# Patient Record
Sex: Female | Born: 1955 | Race: Black or African American | Hispanic: No | Marital: Single | Smoking: Never smoker
Health system: Southern US, Community
[De-identification: ages and names within clinical notes are randomized; demographics above are authoritative.]

## PROBLEM LIST (undated history)

## (undated) DIAGNOSIS — E785 Hyperlipidemia, unspecified: Secondary | ICD-10-CM

## (undated) DIAGNOSIS — E119 Type 2 diabetes mellitus without complications: Secondary | ICD-10-CM

## (undated) DIAGNOSIS — I10 Essential (primary) hypertension: Secondary | ICD-10-CM

## (undated) HISTORY — DX: Hyperlipidemia, unspecified: E78.5

## (undated) HISTORY — DX: Essential (primary) hypertension: I10

## (undated) HISTORY — DX: Type 2 diabetes mellitus without complications: E11.9

---
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
## 1997-05-19 HISTORY — PX: BREAST SURGERY: SHX581

## 2008-04-19 HISTORY — PX: COLONOSCOPY: SHX174

## 2011-10-24 ENCOUNTER — Ambulatory Visit: Payer: Self-pay | Admitting: Internal Medicine

## 2012-12-21 IMAGING — CR RIGHT ANKLE - COMPLETE 3+ VIEW
1 series · 6 of 6 positions shown · non-contrast
Comparison: none

REASON FOR EXAM: ankle pain - fax results [phone]
COMMENTS:

PROCEDURE:     MDR - MDR ANKLE RIGHT COMPLETE  - October 24, 2011 [DATE]
RESULT:     No fracture, dislocation or other acute bony abnormality is
identified. There is moderate soft tissue swelling about the lateral
malleolus. In the lateral view, a plantar calcaneal spur is noted.

[Series 1: ap · 0.17mm/px · 6 of 6 slices shown]
[im 1/6]
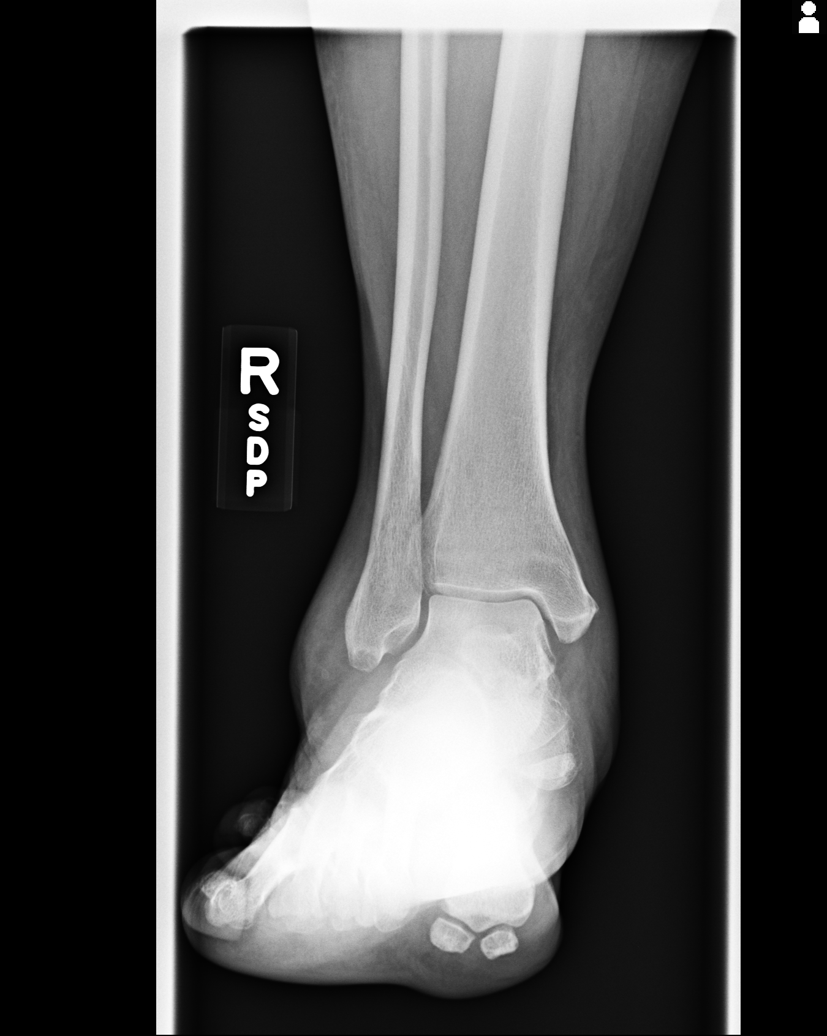
[im 2/6]
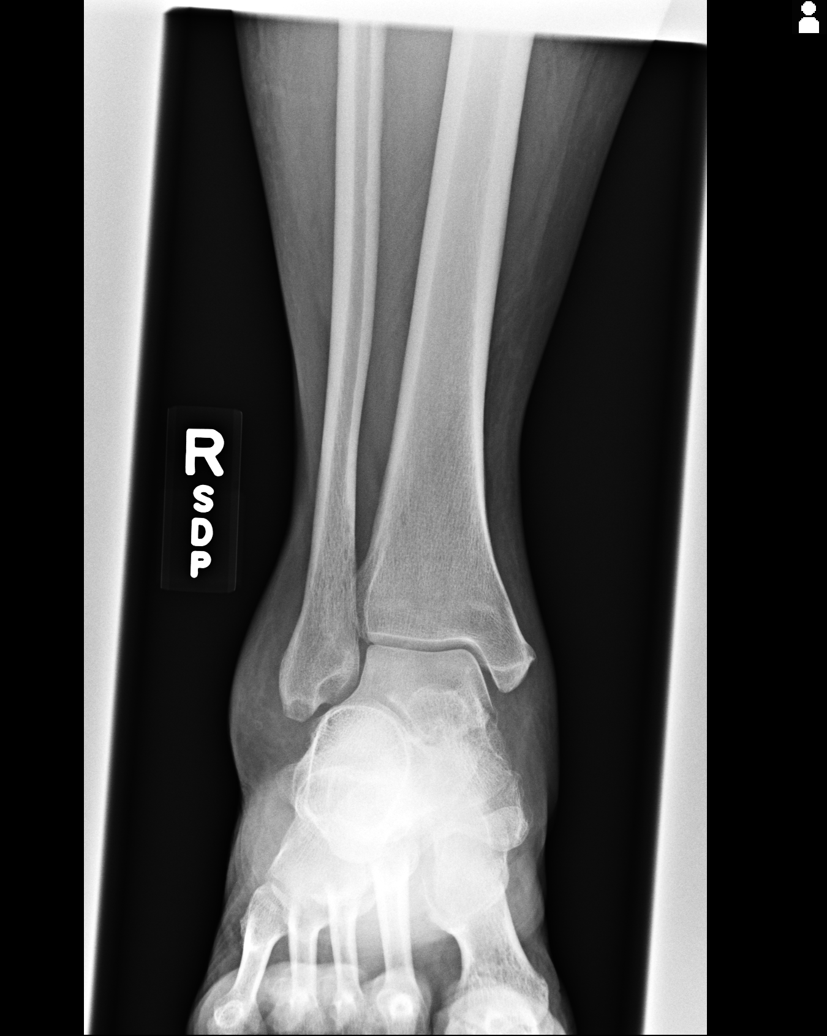
[im 3/6]
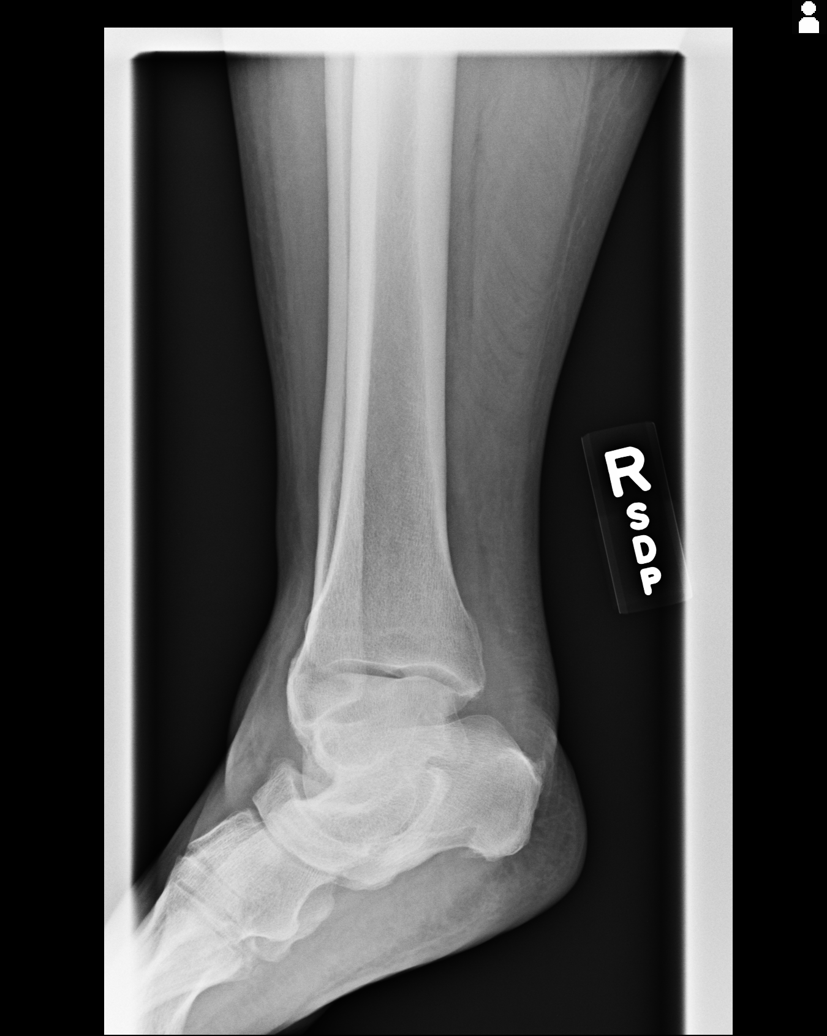
[im 4/6]
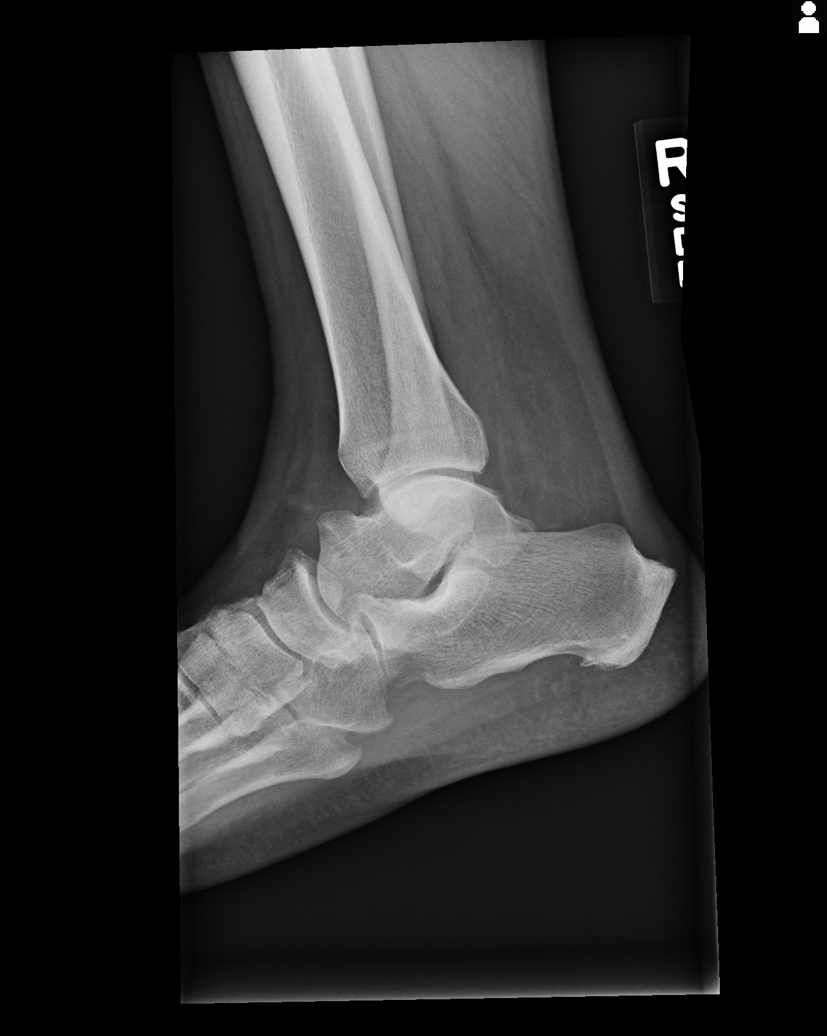
[im 5/6]
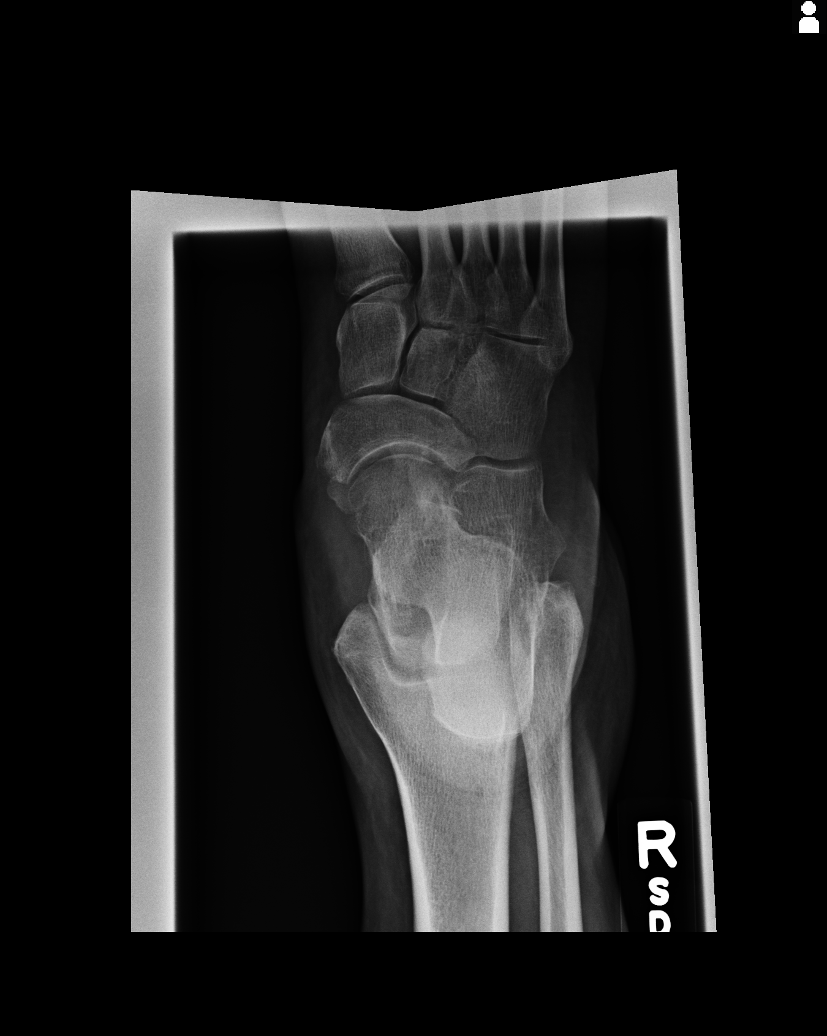
[im 6/6]
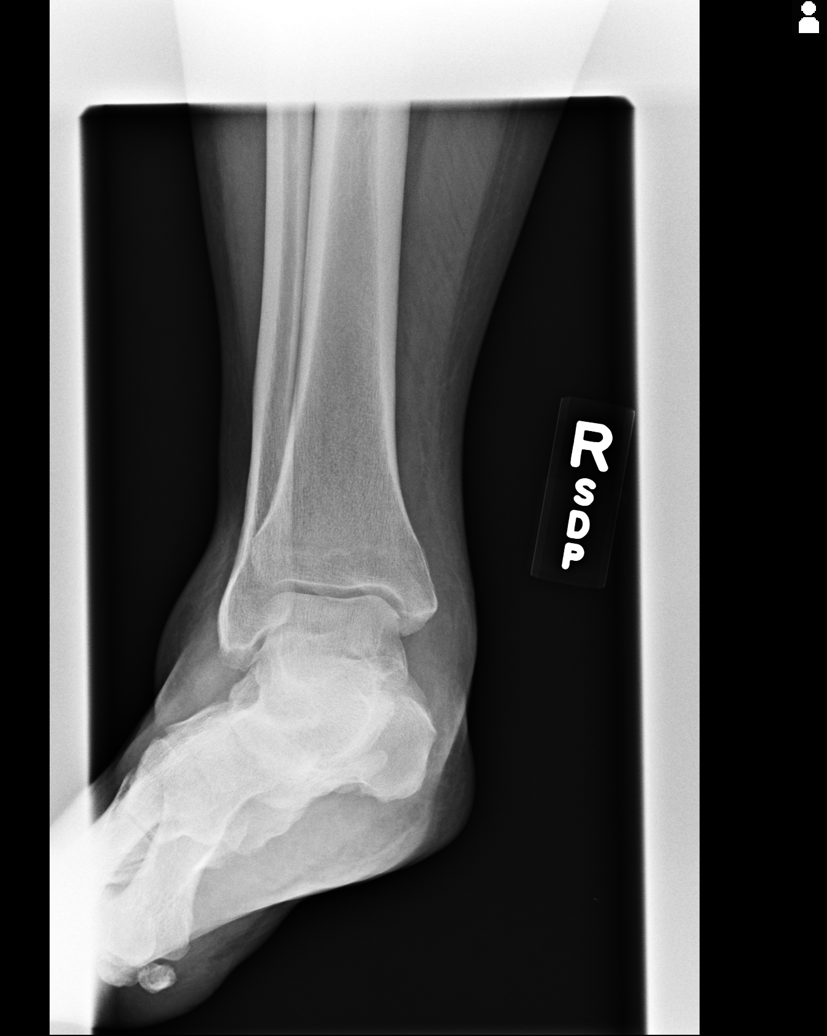

[6 of 6 positions shown; findings below may reference images not displayed]

IMPRESSION: 1. No fracture or other acute bony abnormality is seen.
2. The ankle mortise is well-maintained.
3. Soft tissue swelling is observed laterally.
4. There is a small plantar calcaneal spur.

## 2014-02-02 LAB — HM PAP SMEAR

## 2014-03-10 DIAGNOSIS — R6889 Other general symptoms and signs: Secondary | ICD-10-CM | POA: Insufficient documentation

## 2014-03-10 DIAGNOSIS — IMO0002 Reserved for concepts with insufficient information to code with codable children: Secondary | ICD-10-CM | POA: Insufficient documentation

## 2014-03-10 HISTORY — DX: Reserved for concepts with insufficient information to code with codable children: IMO0002

## 2014-12-06 LAB — HM MAMMOGRAPHY: HM MAMMO: NORMAL

## 2015-05-05 DIAGNOSIS — E785 Hyperlipidemia, unspecified: Secondary | ICD-10-CM | POA: Insufficient documentation

## 2015-05-05 DIAGNOSIS — Z9989 Dependence on other enabling machines and devices: Secondary | ICD-10-CM | POA: Insufficient documentation

## 2015-05-05 DIAGNOSIS — M76829 Posterior tibial tendinitis, unspecified leg: Secondary | ICD-10-CM | POA: Insufficient documentation

## 2015-05-05 DIAGNOSIS — M1710 Unilateral primary osteoarthritis, unspecified knee: Secondary | ICD-10-CM | POA: Insufficient documentation

## 2015-05-05 DIAGNOSIS — G43009 Migraine without aura, not intractable, without status migrainosus: Secondary | ICD-10-CM | POA: Insufficient documentation

## 2015-05-05 DIAGNOSIS — I25118 Atherosclerotic heart disease of native coronary artery with other forms of angina pectoris: Secondary | ICD-10-CM | POA: Insufficient documentation

## 2015-05-05 DIAGNOSIS — M179 Osteoarthritis of knee, unspecified: Secondary | ICD-10-CM | POA: Insufficient documentation

## 2015-05-05 DIAGNOSIS — G4733 Obstructive sleep apnea (adult) (pediatric): Secondary | ICD-10-CM | POA: Insufficient documentation

## 2015-05-05 DIAGNOSIS — N6019 Diffuse cystic mastopathy of unspecified breast: Secondary | ICD-10-CM | POA: Insufficient documentation

## 2015-05-05 DIAGNOSIS — E119 Type 2 diabetes mellitus without complications: Secondary | ICD-10-CM | POA: Insufficient documentation

## 2015-05-05 DIAGNOSIS — I10 Essential (primary) hypertension: Secondary | ICD-10-CM | POA: Insufficient documentation

## 2015-05-05 HISTORY — DX: Hyperlipidemia, unspecified: E78.5

## 2015-06-08 ENCOUNTER — Other Ambulatory Visit: Payer: Self-pay | Admitting: Internal Medicine

## 2015-06-08 ENCOUNTER — Encounter: Payer: Self-pay | Admitting: Internal Medicine

## 2015-06-08 ENCOUNTER — Ambulatory Visit (INDEPENDENT_AMBULATORY_CARE_PROVIDER_SITE_OTHER): Payer: BC Managed Care – PPO | Admitting: Internal Medicine

## 2015-06-08 VITALS — BP 124/70 | HR 56 | Ht 63.0 in | Wt 217.8 lb

## 2015-06-08 DIAGNOSIS — G4733 Obstructive sleep apnea (adult) (pediatric): Secondary | ICD-10-CM

## 2015-06-08 DIAGNOSIS — I2510 Atherosclerotic heart disease of native coronary artery without angina pectoris: Secondary | ICD-10-CM

## 2015-06-08 DIAGNOSIS — I10 Essential (primary) hypertension: Secondary | ICD-10-CM

## 2015-06-08 DIAGNOSIS — E119 Type 2 diabetes mellitus without complications: Secondary | ICD-10-CM

## 2015-06-08 DIAGNOSIS — E785 Hyperlipidemia, unspecified: Secondary | ICD-10-CM | POA: Diagnosis not present

## 2015-06-08 NOTE — Patient Instructions (Signed)
DASH Eating Plan °DASH stands for "Dietary Approaches to Stop Hypertension." The DASH eating plan is a healthy eating plan that has been shown to reduce high blood pressure (hypertension). Additional health benefits may include reducing the risk of type 2 diabetes mellitus, heart disease, and stroke. The DASH eating plan may also help with weight loss. °WHAT DO I NEED TO KNOW ABOUT THE DASH EATING PLAN? °For the DASH eating plan, you will follow these general guidelines: °· Choose foods with a percent daily value for sodium of less than 5% (as listed on the food label). °· Use salt-free seasonings or herbs instead of table salt or sea salt. °· Check with your health care provider or pharmacist before using salt substitutes. °· Eat lower-sodium products, often labeled as "lower sodium" or "no salt added." °· Eat fresh foods. °· Eat more vegetables, fruits, and low-fat dairy products. °· Choose whole grains. Look for the word "whole" as the first word in the ingredient list. °· Choose fish and skinless chicken or turkey more often than red meat. Limit fish, poultry, and meat to 6 oz (170 g) each day. °· Limit sweets, desserts, sugars, and sugary drinks. °· Choose heart-healthy fats. °· Limit cheese to 1 oz (28 g) per day. °· Eat more home-cooked food and less restaurant, buffet, and fast food. °· Limit fried foods. °· Cook foods using methods other than frying. °· Limit canned vegetables. If you do use them, rinse them well to decrease the sodium. °· When eating at a restaurant, ask that your food be prepared with less salt, or no salt if possible. °WHAT FOODS CAN I EAT? °Seek help from a dietitian for individual calorie needs. °Grains °Whole grain or whole wheat bread. Brown rice. Whole grain or whole wheat pasta. Quinoa, bulgur, and whole grain cereals. Low-sodium cereals. Corn or whole wheat flour tortillas. Whole grain cornbread. Whole grain crackers. Low-sodium crackers. °Vegetables °Fresh or frozen vegetables  (raw, steamed, roasted, or grilled). Low-sodium or reduced-sodium tomato and vegetable juices. Low-sodium or reduced-sodium tomato sauce and paste. Low-sodium or reduced-sodium canned vegetables.  °Fruits °All fresh, canned (in natural juice), or frozen fruits. °Meat and Other Protein Products °Ground beef (85% or leaner), grass-fed beef, or beef trimmed of fat. Skinless chicken or turkey. Ground chicken or turkey. Pork trimmed of fat. All fish and seafood. Eggs. Dried beans, peas, or lentils. Unsalted nuts and seeds. Unsalted canned beans. °Dairy °Low-fat dairy products, such as skim or 1% milk, 2% or reduced-fat cheeses, low-fat ricotta or cottage cheese, or plain low-fat yogurt. Low-sodium or reduced-sodium cheeses. °Fats and Oils °Tub margarines without trans fats. Light or reduced-fat mayonnaise and salad dressings (reduced sodium). Avocado. Safflower, olive, or canola oils. Natural peanut or almond butter. °Other °Unsalted popcorn and pretzels. °The items listed above may not be a complete list of recommended foods or beverages. Contact your dietitian for more options. °WHAT FOODS ARE NOT RECOMMENDED? °Grains °White bread. White pasta. White rice. Refined cornbread. Bagels and croissants. Crackers that contain trans fat. °Vegetables °Creamed or fried vegetables. Vegetables in a cheese sauce. Regular canned vegetables. Regular canned tomato sauce and paste. Regular tomato and vegetable juices. °Fruits °Dried fruits. Canned fruit in light or heavy syrup. Fruit juice. °Meat and Other Protein Products °Fatty cuts of meat. Ribs, chicken wings, bacon, sausage, bologna, salami, chitterlings, fatback, hot dogs, bratwurst, and packaged luncheon meats. Salted nuts and seeds. Canned beans with salt. °Dairy °Whole or 2% milk, cream, half-and-half, and cream cheese. Whole-fat or sweetened yogurt. Full-fat   cheeses or blue cheese. Nondairy creamers and whipped toppings. Processed cheese, cheese spreads, or cheese  curds. °Condiments °Onion and garlic salt, seasoned salt, table salt, and sea salt. Canned and packaged gravies. Worcestershire sauce. Tartar sauce. Barbecue sauce. Teriyaki sauce. Soy sauce, including reduced sodium. Steak sauce. Fish sauce. Oyster sauce. Cocktail sauce. Horseradish. Ketchup and mustard. Meat flavorings and tenderizers. Bouillon cubes. Hot sauce. Tabasco sauce. Marinades. Taco seasonings. Relishes. °Fats and Oils °Butter, stick margarine, lard, shortening, ghee, and bacon fat. Coconut, palm kernel, or palm oils. Regular salad dressings. °Other °Pickles and olives. Salted popcorn and pretzels. °The items listed above may not be a complete list of foods and beverages to avoid. Contact your dietitian for more information. °WHERE CAN I FIND MORE INFORMATION? °[employer], [employer], [employer]: www.nhlbi.nih.gov/health/health-topics/topics/dash/ °Document Released: 10/25/2011 Document Revised: 03/22/2014 Document Reviewed: 09/09/2013 °ExitCare® Patient Information ©2015 [employer]. This information is not intended to replace advice given to you by your health care provider. Make sure you discuss any questions you have with your health care provider. ° °

## 2015-06-08 NOTE — Progress Notes (Signed)
Date:  06/08/2015   Name:  Laura Adkins   DOB:  04-25-1956   MRN:  [id number]   Chief Complaint: Diabetes and Hypertension Diabetes She presents for her follow-up diabetic visit. She has type 2 diabetes mellitus. Her disease course has been stable. Pertinent negatives for hypoglycemia include no headaches. Pertinent negatives for diabetes include no blurred vision and no chest pain. There are no hypoglycemic complications. Diabetic complications include heart disease (medically managed CAD). Risk factors for coronary artery disease include dyslipidemia, diabetes mellitus and hypertension. She is compliant with treatment all of the time. Her weight is stable. Meal planning includes avoidance of concentrated sweets. Her breakfast blood glucose is taken between 7-8 am. Her breakfast blood glucose range is generally 110-130 mg/dl. An ACE inhibitor/angiotensin II receptor blocker is being taken.  Hypertension Associated symptoms include shortness of breath. Pertinent negatives include no blurred vision, chest pain, headaches, orthopnea or palpitations. Risk factors for coronary artery disease include diabetes mellitus and dyslipidemia. Past treatments include angiotensin blockers, beta blockers and diuretics. There are no compliance problems.  Hypertensive end-organ damage includes CAD/MI. There is no history of kidney disease.   CAD - since seen last diagnosed with CAD.  Medical management recommended.  Meds were adjusted somewhat.  She has retired and is working hard on taking better care of herself.  She has tried to do some light exercise.  She still has mild SOB at times but her weight is stable.  She has a follow up with Cardiology next month.  Review of Systems:  Review of Systems  Constitutional: Negative for fever, activity change and appetite change.  HENT: Positive for hearing loss. Negative for trouble swallowing.   Eyes: Negative for blurred vision.  Respiratory: Positive for  shortness of breath. Negative for cough, choking, chest tightness and wheezing.   Cardiovascular: Positive for leg swelling. Negative for chest pain, palpitations and orthopnea.  Gastrointestinal: Negative for blood in stool.  Genitourinary: Negative for dysuria.  Musculoskeletal: Positive for arthralgias. Negative for back pain.  Skin: Negative for color change and rash.  Neurological: Negative for numbness and headaches.  Psychiatric/Behavioral: Negative for sleep disturbance.    Patient Active Problem List   Diagnosis Date Noted  . Fibrocystic breast 05/05/2015  . Controlled diabetes mellitus type II without complication 05/05/2015  . CAD in native artery 05/05/2015  . Dyslipidemia 05/05/2015  . Essential (primary) hypertension 05/05/2015  . Migraine without aura and responsive to treatment 05/05/2015  . Obstructive apnea 05/05/2015  . Arthritis of knee, degenerative 05/05/2015  . Posterior tibial tendinitis 05/05/2015  . Nonspecific abnormal finding 03/10/2014    Prior to Admission medications   Medication Sig Start Date End Date Taking? Authorizing Provider  ACCU-CHEK SOFTCLIX LANCETS lancets  01/07/14  Yes Historical Provider, MD  aspirin 81 MG chewable tablet Chew 1 tablet by mouth daily.   Yes Historical Provider, MD  atorvastatin (LIPITOR) 20 MG tablet Take 1 tablet by mouth daily. 08/05/14  Yes Historical Provider, MD  Calcium Carbonate-Vit D-Min (CALTRATE 600+D PLUS MINERALS) 600-800 MG-UNIT TABS Take 1 tablet by mouth daily.   Yes Historical Provider, MD  furosemide (LASIX) 40 MG tablet Take 1 tablet by mouth daily. 05/16/15  Yes Historical Provider, MD  glucose blood (ACCU-CHEK AVIVA PLUS) test strip ACCU-CHEK AVIVA PLUS (In Vitro Strip)  1 (one) Strip Strip daily for 30 days  Quantity: 50;  Refills: 12   Ordered :26-Nov-2014  Bari Edward M.D.;  Started 26-Nov-2014 Active Comments: dx: E11.09 11/26/14  Yes  Historical Provider, MD  glucose blood test strip  03/01/14  Yes  Historical Provider, MD  KLOR-CON M20 20 MEQ tablet Take 1 tablet by mouth daily. 05/14/15  Yes Historical Provider, MD  losartan (COZAAR) 100 MG tablet Take 1 tablet by mouth daily. 05/16/15  Yes Historical Provider, MD  metFORMIN (GLUCOPHAGE) 500 MG tablet Take 1 tablet by mouth daily. 06/08/14  Yes Historical Provider, MD  metoprolol succinate (TOPROL-XL) 25 MG 24 hr tablet Take 12.5 mg by mouth daily. 05/01/15  Yes Historical Provider, MD  nystatin cream (MYCOSTATIN) 1 application daily. 02/02/14  Yes Historical Provider, MD    Allergies  Allergen Reactions  . Ace Inhibitors Other (See Comments)  . Orange Oil Other (See Comments)    Unable to tolerate it.   . Celebrex  [Celecoxib] Rash    Past Surgical History  Procedure Laterality Date  . Breast surgery  05/1997    necrosis    History  Substance Use Topics  . Smoking status: Never Smoker   . Smokeless tobacco: Not on file  . Alcohol Use: No    Medication list has been reviewed and updated.  Physical Examination:  Physical Exam  Constitutional: She is oriented to person, place, and time. She appears well-developed and well-nourished. No distress.  HENT:  Head: Normocephalic and atraumatic.  Eyes: Conjunctivae are normal. Right eye exhibits no discharge. Left eye exhibits no discharge. No scleral icterus.  Neck: Carotid bruit is not present. No thyromegaly present.  Cardiovascular: Normal rate, regular rhythm and S1 normal.   Pulmonary/Chest: Effort normal and breath sounds normal. No respiratory distress. She has no wheezes.  Musculoskeletal: Normal range of motion.  Neurological: She is alert and oriented to person, place, and time.  Skin: Skin is warm and dry. No rash noted.  Psychiatric: She has a normal mood and affect. Her behavior is normal. Thought content normal. Cognition and memory are normal.    BP 124/70 mmHg  Pulse 56  Ht 5\' 3"  (1.6 m)  Wt 217 lb 12.8 oz (98.793 kg)  BMI 38.59 kg/m2  Assessment and  Plan: 1. CAD in native artery Managed medically  2. Essential (primary) hypertension controlled - Basic metabolic panel  3. Controlled diabetes mellitus type II without complication Doing well - continue current therapy - Hemoglobin A1c  4. Dyslipidemia On statin therapy  5. Obstructive apnea Compliant with CPAP   Bari EdwardLaura Criselda Starke, MD [facility]  06/08/2015

## 2015-06-09 ENCOUNTER — Other Ambulatory Visit: Payer: Self-pay | Admitting: Internal Medicine

## 2015-06-09 LAB — BASIC METABOLIC PANEL
BUN/Creatinine Ratio: 14 (ref 9–23)
BUN: 12 mg/dL (ref 6–24)
CO2: 27 mmol/L (ref 18–29)
CREATININE: 0.87 mg/dL (ref 0.57–1.00)
Calcium: 9.8 mg/dL (ref 8.7–10.2)
Chloride: 99 mmol/L (ref 97–108)
GFR, EST AFRICAN AMERICAN: 86 mL/min/{1.73_m2} (ref >59)
GFR, EST NON AFRICAN AMERICAN: 74 mL/min/{1.73_m2} (ref >59)
GLUCOSE: 98 mg/dL (ref 65–99)
POTASSIUM: 4.5 mmol/L (ref 3.5–5.2)
Sodium: 143 mmol/L (ref 134–144)

## 2015-06-09 LAB — HEMOGLOBIN A1C
Est. average glucose Bld gHb Est-mCnc: 146 mg/dL
HEMOGLOBIN A1C: 6.7 % — AB (ref 4.8–5.6)

## 2015-07-14 LAB — HM DIABETES EYE EXAM

## 2015-08-11 ENCOUNTER — Other Ambulatory Visit: Payer: Self-pay | Admitting: Internal Medicine

## 2015-10-10 ENCOUNTER — Encounter: Payer: Self-pay | Admitting: Internal Medicine

## 2015-10-10 ENCOUNTER — Ambulatory Visit (INDEPENDENT_AMBULATORY_CARE_PROVIDER_SITE_OTHER): Payer: BC Managed Care – PPO | Admitting: Internal Medicine

## 2015-10-10 ENCOUNTER — Other Ambulatory Visit: Payer: Self-pay

## 2015-10-10 DIAGNOSIS — I10 Essential (primary) hypertension: Secondary | ICD-10-CM

## 2015-10-10 DIAGNOSIS — E118 Type 2 diabetes mellitus with unspecified complications: Secondary | ICD-10-CM | POA: Insufficient documentation

## 2015-10-10 DIAGNOSIS — I25118 Atherosclerotic heart disease of native coronary artery with other forms of angina pectoris: Secondary | ICD-10-CM | POA: Diagnosis not present

## 2015-10-10 DIAGNOSIS — Z23 Encounter for immunization: Secondary | ICD-10-CM

## 2015-10-10 DIAGNOSIS — E785 Hyperlipidemia, unspecified: Secondary | ICD-10-CM

## 2015-10-10 DIAGNOSIS — E119 Type 2 diabetes mellitus without complications: Secondary | ICD-10-CM | POA: Diagnosis not present

## 2015-10-10 NOTE — Progress Notes (Signed)
Date:  10/10/2015   Name:  Laura Adkins   DOB:  May 03, 1956   MRN:  [id number]   Chief Complaint: Diabetes; Hypertension; and Hyperlipidemia Diabetes She presents for her follow-up diabetic visit. She has type 2 diabetes mellitus. Her disease course has been stable. Pertinent negatives for hypoglycemia include no headaches. Pertinent negatives for diabetes include no blurred vision, no chest pain, no fatigue, no polydipsia and no polyuria. Her weight is stable. Her breakfast blood glucose is taken between 7-8 am. Her breakfast blood glucose range is generally 90-110 mg/dl.  Hypertension This is a chronic problem. The current episode started more than 1 year ago. The problem is unchanged. The problem is controlled. Associated symptoms include shortness of breath. Pertinent negatives include no blurred vision, chest pain, headaches, neck pain or palpitations.  Hyperlipidemia This is a chronic problem. The current episode started more than 1 year ago. The problem is controlled. Recent lipid tests were reviewed and are normal. Exacerbating diseases include diabetes. Associated symptoms include shortness of breath. Pertinent negatives include no chest pain or myalgias. Current antihyperlipidemic treatment includes statins.   Coronary artery disease - stable without angina. She is able to do her activities of daily living and any other housework and shopping without limitations. She still has mild shortness of breath but cardiology feels that this is stable and not an anginal equivalent. She has decided to retire. Apparently her work would not accommodate her duties from her foot and ankle problem. 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  Review of Systems  Constitutional: Negative for fever, chills, fatigue and unexpected weight change.  Eyes: Negative for blurred vision.  Respiratory: Positive for shortness of breath. Negative for choking and  chest tightness.   Cardiovascular: Positive for leg swelling. Negative for chest pain and palpitations.  Endocrine: Negative for polydipsia and polyuria.  Genitourinary: Negative for dysuria.  Musculoskeletal: Positive for arthralgias. Negative for myalgias, joint swelling and neck pain.  Skin: Negative for rash.  Neurological: Negative for headaches.  Psychiatric/Behavioral: Negative for sleep disturbance and dysphoric mood.    Patient Active Problem List   Diagnosis Date Noted  . Fibrocystic breast 05/05/2015  . Controlled diabetes mellitus type II without complication (HCC) 05/05/2015  . CAD in native artery 05/05/2015  . Dyslipidemia 05/05/2015  . Essential (primary) hypertension 05/05/2015  . Migraine without aura and responsive to treatment 05/05/2015  . Obstructive apnea 05/05/2015  . Arthritis of knee, degenerative 05/05/2015  . Posterior tibial tendinitis 05/05/2015  . Nonspecific abnormal finding 03/10/2014    Prior to Admission medications   Medication Sig Start Date End Date Taking? Authorizing Provider  ACCU-CHEK SOFTCLIX LANCETS lancets  01/07/14  Yes Historical Provider, MD  ACCU-CHEK SOFTCLIX LANCETS lancets  01/07/14  Yes Historical Provider, MD  aspirin 81 MG chewable tablet Chew 1 tablet by mouth daily.   Yes Historical Provider, MD  atorvastatin (LIPITOR) 20 MG tablet TAKE 1 TAB AT BEDTIME 08/11/15  Yes Reubin Milan, MD  Calcium Carbonate-Vit D-Min (CALTRATE 600+D PLUS MINERALS) 600-800 MG-UNIT TABS Take 1 tablet by mouth daily.   Yes Historical Provider, MD  furosemide (LASIX) 40 MG tablet Take 1 tablet by mouth daily. 05/16/15  Yes Historical Provider, MD  glucose blood (ACCU-CHEK AVIVA PLUS) test strip ACCU-CHEK AVIVA PLUS (In Vitro Strip)  1 (one) Strip Strip daily for 30 days  Quantity: 50;  Refills: 12   Ordered :26-Nov-2014  Bari Edward M.D.;  Started 26-Nov-2014  Active Comments: dx: E11.09 11/26/14  Yes Historical Provider, MD  glucose blood test  strip  03/01/14  Yes Historical Provider, MD  KLOR-CON M20 20 MEQ tablet Take 1 tablet by mouth daily. 05/14/15  Yes Historical Provider, MD  losartan (COZAAR) 100 MG tablet Take 1 tablet by mouth daily. 05/16/15  Yes Historical Provider, MD  metFORMIN (GLUCOPHAGE) 500 MG tablet TAKE 1 TABLET EVERY DAY 06/09/15  Yes Reubin MilanLaura H Lavonn Maxcy, MD  metoprolol succinate (TOPROL-XL) 25 MG 24 hr tablet Take 12.5 mg by mouth daily. 05/01/15  Yes Historical Provider, MD  Multiple Vitamins tablet Take by mouth.   Yes Historical Provider, MD  nystatin cream (MYCOSTATIN) 1 application daily. 02/02/14  Yes Historical Provider, MD    Allergies  Allergen Reactions  . Ace Inhibitors Other (See Comments)  . Orange Oil Other (See Comments)    Unable to tolerate it.   . Celebrex  [Celecoxib] Rash    Past Surgical History  Procedure Laterality Date  . Breast surgery  05/1997    necrosis    Social History  Substance Use Topics  . Smoking status: Never Smoker   . Smokeless tobacco: None  . Alcohol Use: No   Wt Readings from Last 3 Encounters:  10/10/15 219 lb 9.6 oz (99.61 kg)  06/08/15 217 lb 12.8 oz (98.793 kg)  05/05/15 211 lb 4 oz (95.822 kg)     Medication list has been reviewed and updated.   Physical Exam  Constitutional: She is oriented to person, place, and time. She appears well-developed and well-nourished. No distress.  HENT:  Head: Normocephalic and atraumatic.  Eyes: Right eye exhibits no discharge. Left eye exhibits no discharge. No scleral icterus.  Neck: Normal range of motion. Neck supple. Carotid bruit is not present. No thyromegaly present.  Cardiovascular: Normal rate, regular rhythm and normal heart sounds.   Pulmonary/Chest: Effort normal and breath sounds normal. No respiratory distress. She has no decreased breath sounds. She has no wheezes.  Musculoskeletal: Normal range of motion. She exhibits edema.  Lymphadenopathy:    She has no cervical adenopathy.  Neurological: She is  alert and oriented to person, place, and time.  Skin: Skin is warm and dry. No rash noted.  Psychiatric: She has a normal mood and affect. Her behavior is normal. Thought content normal.    BP 120/70 mmHg  Pulse 60  Ht 5\' 3"  (1.6 m)  Wt 219 lb 9.6 oz (99.61 kg)  BMI 38.91 kg/m2  Assessment and Plan: 1. Essential (primary) hypertension Controlled on current medication  2. Controlled type 2 diabetes mellitus without complication, without long-term current use of insulin (HCC) Doing well with oral agents - Hemoglobin A1c  3. Dyslipidemia On statin therapy  4. Coronary artery disease of native artery of native heart with stable angina pectoris (HCC) Stable symptoms; continue current medications and cardiology follow-up  5. Flu vaccine need - Flu Vaccine QUAD 36+ mos PF IM (Fluarix & Fluzone Quad PF)   Bari EdwardLaura Aeryn Medici, MD [facility]  10/10/2015

## 2015-10-11 LAB — HEMOGLOBIN A1C
ESTIMATED AVERAGE GLUCOSE: 151 mg/dL
Hgb A1c MFr Bld: 6.9 % — ABNORMAL HIGH (ref 4.8–5.6)

## 2015-10-24 ENCOUNTER — Other Ambulatory Visit: Payer: Self-pay | Admitting: Internal Medicine

## 2015-12-14 ENCOUNTER — Other Ambulatory Visit: Payer: Self-pay | Admitting: Internal Medicine

## 2015-12-15 ENCOUNTER — Other Ambulatory Visit: Payer: Self-pay | Admitting: Internal Medicine

## 2015-12-15 MED ORDER — GLUCOSE BLOOD VI STRP: ORAL_STRIP | Status: DC

## 2015-12-19 ENCOUNTER — Other Ambulatory Visit: Payer: Self-pay

## 2015-12-19 MED ORDER — ONETOUCH ULTRA SYSTEM W/DEVICE KIT: 1.0000 | PACK | Freq: Once | Status: AC

## 2016-01-06 ENCOUNTER — Encounter: Payer: Self-pay | Admitting: Internal Medicine

## 2016-01-06 ENCOUNTER — Ambulatory Visit (INDEPENDENT_AMBULATORY_CARE_PROVIDER_SITE_OTHER): Payer: BC Managed Care – PPO | Admitting: Internal Medicine

## 2016-01-06 VITALS — BP 100/66 | HR 68 | Ht 63.0 in | Wt 223.0 lb

## 2016-01-06 DIAGNOSIS — E119 Type 2 diabetes mellitus without complications: Secondary | ICD-10-CM

## 2016-01-06 DIAGNOSIS — H6691 Otitis media, unspecified, right ear: Secondary | ICD-10-CM | POA: Diagnosis not present

## 2016-01-06 MED ORDER — AZITHROMYCIN 250 MG PO TABS: ORAL_TABLET | ORAL | Status: DC

## 2016-01-06 MED ORDER — ONETOUCH ULTRASOFT LANCETS MISC: 1.0000 | Freq: Every day | Status: DC

## 2016-01-06 NOTE — Progress Notes (Signed)
Date:  01/06/2016   Name:  Laura Adkins   DOB:  04-Jul-1956   MRN:  [id number]   Chief Complaint: Ear Pain Otalgia  There is pain in the right ear. This is a new problem. The current episode started in the past 7 days. The problem occurs constantly. The problem has been unchanged. There has been no fever. Associated symptoms include hearing loss, rhinorrhea and a sore throat. Pertinent negatives include no coughing, diarrhea, ear discharge, headaches, neck pain or vomiting. She has tried acetaminophen for the symptoms. The treatment provided mild relief.      Review of Systems  Constitutional: Negative for fever, chills and fatigue.  HENT: Positive for ear pain, hearing loss, rhinorrhea and sore throat. Negative for ear discharge.   Respiratory: Negative for cough, chest tightness and wheezing.   Cardiovascular: Negative for chest pain and palpitations.  Gastrointestinal: Negative for vomiting and diarrhea.  Musculoskeletal: Negative for neck pain and neck stiffness.  Neurological: Negative for dizziness, light-headedness and headaches.    Patient Active Problem List   Diagnosis Date Noted  . Controlled type 2 diabetes mellitus without complication, without long-term current use of insulin ([place]) 10/10/2015  . Coronary artery disease of native artery of native heart with stable angina pectoris ([place]) 10/10/2015  . Fibrocystic breast 05/05/2015  . Dyslipidemia 05/05/2015  . Essential (primary) hypertension 05/05/2015  . Migraine without aura and responsive to treatment 05/05/2015  . Obstructive apnea 05/05/2015  . Arthritis of knee, degenerative 05/05/2015  . Posterior tibial tendinitis 05/05/2015    Prior to Admission medications   Medication Sig Start Date End Date Taking? Authorizing Provider  ACCU-CHEK SOFTCLIX LANCETS lancets  01/07/14  Yes Historical Provider, MD  aspirin 81 MG chewable tablet Chew 1 tablet by mouth daily.   Yes Historical Provider, MD    atorvastatin (LIPITOR) 20 MG tablet TAKE 1 TAB AT BEDTIME 08/11/15  Yes Glean Hess, MD  Blood Glucose Monitoring Suppl (ONE TOUCH ULTRA SYSTEM KIT) w/Device KIT 1 kit by Does not apply route once. 12/19/15  Yes Glean Hess, MD  Calcium Carbonate-Vit D-Min (CALTRATE 600+D PLUS MINERALS) 600-800 MG-UNIT TABS Take 1 tablet by mouth daily.   Yes Historical Provider, MD  furosemide (LASIX) 40 MG tablet Take 1 tablet by mouth daily. 05/16/15  Yes Historical Provider, MD  glucose blood (ONE TOUCH TEST STRIPS) test strip Use to test blood sugar daily 12/15/15  Yes Glean Hess, MD  KLOR-CON M20 20 MEQ tablet Take 1 tablet by mouth daily. 05/14/15  Yes Historical Provider, MD  losartan (COZAAR) 100 MG tablet Take 1 tablet by mouth daily. 05/16/15  Yes Historical Provider, MD  metFORMIN (GLUCOPHAGE) 500 MG tablet TAKE 1 TABLET EVERY DAY 06/09/15  Yes Glean Hess, MD  metoprolol succinate (TOPROL-XL) 25 MG 24 hr tablet Take 12.5 mg by mouth daily. 05/01/15  Yes Historical Provider, MD  Multiple Vitamins tablet Take by mouth.   Yes Historical Provider, MD  nystatin cream (MYCOSTATIN) APPLY 1 APPLICATION DAILY 60/6/30  Yes Glean Hess, MD    Allergies  Allergen Reactions  . Ace Inhibitors Other (See Comments)  . Orange Oil Other (See Comments)    Unable to tolerate it.   . Celebrex  [Celecoxib] Rash    Past Surgical History  Procedure Laterality Date  . Breast surgery  05/1997    necrosis    Social History  Substance Use Topics  . Smoking status: Never Smoker   . Smokeless tobacco:  None  . Alcohol Use: No     Medication list has been reviewed and updated.   Physical Exam  Constitutional: She is oriented to person, place, and time. She appears well-developed and well-nourished.  HENT:  Right Ear: External ear and ear canal normal. Tympanic membrane is retracted. Tympanic membrane is not erythematous. Decreased hearing is noted.  Left Ear: External ear and ear canal  normal. Tympanic membrane is not erythematous and not retracted.  Nose: Right sinus exhibits no maxillary sinus tenderness and no frontal sinus tenderness. Left sinus exhibits no maxillary sinus tenderness and no frontal sinus tenderness.  Mouth/Throat: Uvula is midline and mucous membranes are normal. No oral lesions. No oropharyngeal exudate, posterior oropharyngeal edema or posterior oropharyngeal erythema.  Cardiovascular: Normal rate, regular rhythm and normal heart sounds.   Pulmonary/Chest: Breath sounds normal. She has no wheezes. She has no rales.  Lymphadenopathy:    She has no cervical adenopathy.  Neurological: She is alert and oriented to person, place, and time.  Psychiatric: She has a normal mood and affect.  Nursing note and vitals reviewed.   BP 100/66 mmHg  Pulse 68  Ht _0  (1.6 m)  Wt 223 lb (101.152 kg)  BMI 39.51 kg/m2  SpO2 100%  Assessment and Plan: 1. Acute right otitis media, recurrence not specified, unspecified otitis media type Begin Nasonex nasal spray (patient has at home) - azithromycin (ZITHROMAX Z-PAK) 250 MG tablet; 2 pills on day #1 then 1 pill a day for 4 more days  Dispense: 6 each; Refill: 0  2. Type 2 diabetes mellitus without complication, without long-term current use of insulin ([place]) Continue working on diet Follow up as planned - Lancets (ONETOUCH ULTRASOFT) lancets; 1 each by Other route daily. Use as instructed  Dispense: 100 each; Refill: [place], MD [facility]  01/06/2016

## 2016-02-06 ENCOUNTER — Ambulatory Visit (INDEPENDENT_AMBULATORY_CARE_PROVIDER_SITE_OTHER): Payer: BC Managed Care – PPO | Admitting: Internal Medicine

## 2016-02-06 ENCOUNTER — Encounter: Payer: Self-pay | Admitting: Internal Medicine

## 2016-02-06 VITALS — BP 118/62 | HR 68 | Ht 63.0 in | Wt 224.2 lb

## 2016-02-06 DIAGNOSIS — R87612 Low grade squamous intraepithelial lesion on cytologic smear of cervix (LGSIL): Secondary | ICD-10-CM | POA: Diagnosis not present

## 2016-02-06 DIAGNOSIS — E119 Type 2 diabetes mellitus without complications: Secondary | ICD-10-CM | POA: Diagnosis not present

## 2016-02-06 DIAGNOSIS — Z23 Encounter for immunization: Secondary | ICD-10-CM

## 2016-02-06 DIAGNOSIS — I10 Essential (primary) hypertension: Secondary | ICD-10-CM

## 2016-02-06 DIAGNOSIS — Z0000 Encounter for general adult medical examination without abnormal findings: Secondary | ICD-10-CM

## 2016-02-06 DIAGNOSIS — Z1159 Encounter for screening for other viral diseases: Secondary | ICD-10-CM

## 2016-02-06 DIAGNOSIS — I25118 Atherosclerotic heart disease of native coronary artery with other forms of angina pectoris: Secondary | ICD-10-CM

## 2016-02-06 DIAGNOSIS — E785 Hyperlipidemia, unspecified: Secondary | ICD-10-CM

## 2016-02-06 LAB — POCT URINALYSIS DIPSTICK
Bilirubin, UA: NEGATIVE
Glucose, UA: NEGATIVE
Ketones, UA: NEGATIVE
Leukocytes, UA: NEGATIVE
Nitrite, UA: NEGATIVE
PROTEIN UA: NEGATIVE
RBC UA: NEGATIVE
SPEC GRAV UA: 1.015
UROBILINOGEN UA: 0.2
pH, UA: 6.5

## 2016-02-06 NOTE — Patient Instructions (Addendum)
Tdap Vaccine (Tetanus, Diphtheria and Pertussis): What You Need to Know 1. Why get vaccinated? Tetanus, diphtheria and pertussis are very serious diseases. Tdap vaccine can protect [place] from these diseases. And, Tdap vaccine given to pregnant women can protect newborn babies against pertussis. TETANUS (Lockjaw) is rare in the [place] today. It causes painful muscle tightening and stiffness, usually all over the body. 1. It can lead to tightening of muscles in the head and neck so you can't open your mouth, swallow, or sometimes even breathe. Tetanus kills about 1 out of 10 people who are infected even after receiving the best medical care. DIPHTHERIA is also rare in the [place] today. It can cause a thick coating to form in the back of the throat.  It can lead to breathing problems, heart failure, paralysis, and death. PERTUSSIS (Whooping Cough) causes severe coughing spells, which can cause difficulty breathing, vomiting and disturbed sleep.  It can also lead to weight loss, incontinence, and rib fractures. Up to 2 in 100 adolescents and 5 in 100 adults with pertussis are hospitalized or have complications, which could include pneumonia or death. These diseases are caused by bacteria. Diphtheria and pertussis are spread from person to person through secretions from coughing or sneezing. Tetanus enters the body through cuts, scratches, or wounds. Before vaccines, as many as 200,000 cases of diphtheria, 200,000 cases of pertussis, and hundreds of cases of tetanus, were reported in the [place] each year. Since vaccination began, reports of cases for tetanus and diphtheria have dropped by about 99% and for pertussis by about 80%. 2. Tdap vaccine Tdap vaccine can protect adolescents and adults from tetanus, diphtheria, and pertussis. One dose of Tdap is routinely given at age 66 or 45. People who did not get Tdap at that age should get it as soon as possible. Tdap is especially important  for healthcare professionals and anyone having close contact with a baby younger than 12 months. Pregnant women should get a dose of Tdap during every pregnancy, to protect the newborn from pertussis. Infants are most at risk for severe, life-threatening complications from pertussis. Another vaccine, called Td, protects against tetanus and diphtheria, but not pertussis. A Td booster should be given every 10 years. Tdap may be given as one of these boosters if you have never gotten Tdap before. Tdap may also be given after a severe cut or burn to prevent tetanus infection. Your doctor or the person giving you the vaccine can give you more information. Tdap may safely be given at the same time as other vaccines. 3. Some people should not get this vaccine  A person who has ever had a life-threatening allergic reaction after a previous dose of any diphtheria, tetanus or pertussis containing vaccine, OR has a severe allergy to any part of this vaccine, should not get Tdap vaccine. Tell the person giving the vaccine about any severe allergies.  Anyone who had coma or long repeated seizures within 7 days after a childhood dose of DTP or DTaP, or a previous dose of Tdap, should not get Tdap, unless a cause other than the vaccine was found. They can still get Td.  Talk to your doctor if you:  have seizures or another nervous system problem,  had severe pain or swelling after any vaccine containing diphtheria, tetanus or pertussis,  ever had a condition called Guillain-Barr Syndrome (GBS),  aren't feeling well on the day the shot is scheduled. 4. Risks With any medicine, including vaccines, there is  a chance of side effects. These are usually mild and go away on their own. Serious reactions are also possible but are rare. Most people who get Tdap vaccine do not have any problems with it. Mild problems following Tdap (Did not interfere with activities)  Pain where the shot was given (about 3 in 4  adolescents or 2 in 3 adults)  Redness or swelling where the shot was given (about 1 person in 5)  Mild fever of at least 100.4F (up to about 1 in 25 adolescents or 1 in 100 adults)  Headache (about 3 or 4 people in 10)  Tiredness (about 1 person in 3 or 4)  Nausea, vomiting, diarrhea, stomach ache (up to 1 in 4 adolescents or 1 in 10 adults)  Chills, sore joints (about 1 person in 10)  Body aches (about 1 person in 3 or 4)  Rash, swollen glands (uncommon) Moderate problems following Tdap (Interfered with activities, but did not require medical attention)  Pain where the shot was given (up to 1 in 5 or 6)  Redness or swelling where the shot was given (up to about 1 in 16 adolescents or 1 in 12 adults)  Fever over 102F (about 1 in 100 adolescents or 1 in 250 adults)  Headache (about 1 in 7 adolescents or 1 in 10 adults)  Nausea, vomiting, diarrhea, stomach ache (up to 1 or 3 people in 100)  Swelling of the entire arm where the shot was given (up to about 1 in 500). Severe problems following Tdap (Unable to perform usual activities; required medical attention)  Swelling, severe pain, bleeding and redness in the arm where the shot was given (rare). Problems that could happen after any vaccine:  People sometimes faint after a medical procedure, including vaccination. Sitting or lying down for about 15 minutes can help prevent fainting, and injuries caused by a fall. Tell your doctor if you feel dizzy, or have vision changes or ringing in the ears.  Some people get severe pain in the shoulder and have difficulty moving the arm where a shot was given. This happens very rarely.  Any medication can cause a severe allergic reaction. Such reactions from a vaccine are very rare, estimated at fewer than 1 in a million doses, and would happen within a few minutes to a few hours after the vaccination. As with any medicine, there is a very remote chance of a vaccine causing a serious  injury or death. The safety of vaccines is always being monitored. For more information, visit: www.cdc.gov/vaccinesafety/ 5. What if there is a serious problem? What should I look for?  Look for anything that concerns you, such as signs of a severe allergic reaction, very high fever, or unusual behavior.  Signs of a severe allergic reaction can include hives, swelling of the face and throat, difficulty breathing, a fast heartbeat, dizziness, and weakness. These would usually start a few minutes to a few hours after the vaccination. What should I do?  If you think it is a severe allergic reaction or other emergency that can't wait, call 9-1-1 or get the person to the nearest hospital. Otherwise, call your doctor.  Afterward, the reaction should be reported to the Vaccine Adverse Event Reporting System (VAERS). Your doctor might file this report, or you can do it yourself through the VAERS web site at www.vaers.hhs.gov, or by calling 1-800-822-7967. VAERS does not give medical advice.  6. The National Vaccine Injury Compensation Program The National Vaccine Injury Compensation Program (  VICP) is a federal program that was created to compensate people who may have been injured by certain vaccines. Persons who believe they may have been injured by a vaccine can learn about the program and about filing a claim by calling 1-800-338-2382 or visiting the VICP website at www.hrsa.gov/vaccinecompensation. There is a time limit to file a claim for compensation. 7. How can I learn more?  Ask your doctor. He or she can give you the vaccine package insert or suggest other sources of information.  Call your local or state health department.  Contact the Centers for Disease Control and Prevention (CDC):  Call 1-800-232-4636 (1-800-CDC-INFO) or  Visit CDC's website at www.cdc.gov/vaccines CDC Tdap Vaccine VIS (01/12/14)   This information is not intended to replace advice given to you by your health care  provider. Make sure you discuss any questions you have with your health care provider.   Document Released: 05/06/2012 Document Revised: 11/26/2014 Document Reviewed: 02/17/2014 Elsevier Interactive Patient Education 2016 Else[employer] Breast Self-Awareness Practicing breast self-awareness may pick up problems early, prevent significant medical complications, and possibly save your life. By practicing breast self-awareness, you can become familiar with how your breasts look and feel and if your breasts are changing. This allows you to notice changes early. It can also offer you some reassurance that your breast health is good. One way to learn what is normal for your breasts and whether your breasts are changing is to do a breast self-exam. If you find a lump or something that was not present in the past, it is best to contact your caregiver right away. Other findings that should be evaluated by your caregiver include nipple discharge, especially if it is bloody; skin changes or reddening; areas where the skin seems to be pulled in (retracted); or new lumps and bumps. Breast pain is seldom associated with cancer (malignancy), but should also be evaluated by a caregiver. HOW TO PERFORM A BREAST SELF-EXAM The best time to examine your breasts is 5-7 days after your menstrual period is over. During menstruation, the breasts are lumpier, and it may be more difficult to pick up changes. If you do not menstruate, have reached menopause, or had your uterus removed (hysterectomy), you should examine your breasts at regular intervals, such as monthly. If you are breastfeeding, examine your breasts after a feeding or after using a breast pump. Breast implants do not decrease the risk for lumps or tumors, so continue to perform breast self-exams as recommended. Talk to your caregiver about how to determine the difference between the implant and breast tissue. Also, talk about the amount of pressure you should use  during the exam. Over time, you will become more familiar with the variations of your breasts and more comfortable with the exam. A breast self-exam requires you to remove all your clothes above the waist. 2. Look at your breasts and nipples. Stand in front of a mirror in a room with good lighting. With your hands on your hips, push your hands firmly downward. Look for a difference in shape, contour, and size from one breast to the other (asymmetry). Asymmetry includes puckers, dips, or bumps. Also, look for skin changes, such as reddened or scaly areas on the breasts. Look for nipple changes, such as discharge, dimpling, repositioning, or redness. 3. Carefully feel your breasts. This is best done either in the shower or tub while using soapy water or when flat on your back. Place the arm (on the side of the breast you   are examining) above your head. Use the pads (not the fingertips) of your three middle fingers on your opposite hand to feel your breasts. Start in the underarm area and use  inch (2 cm) overlapping circles to feel your breast. Use 3 different levels of pressure (light, medium, and firm pressure) at each circle before moving to the next circle. The light pressure is needed to feel the tissue closest to the skin. The medium pressure will help to feel breast tissue a little deeper, while the firm pressure is needed to feel the tissue close to the ribs. Continue the overlapping circles, moving downward over the breast until you feel your ribs below your breast. Then, move one finger-width towards the center of the body. Continue to use the  inch (2 cm) overlapping circles to feel your breast as you move slowly up toward the collar bone (clavicle) near the base of the neck. Continue the up and down exam using all 3 pressures until you reach the middle of the chest. Do this with each breast, carefully feeling for lumps or changes. 4.  Keep a written record with breast changes or normal findings for  each breast. By writing this information down, you do not need to depend only on memory for size, tenderness, or location. Write down where you are in your menstrual cycle, if you are still menstruating. Breast tissue can have some lumps or thick tissue. However, see your caregiver if you find anything that concerns you.  SEEK MEDICAL CARE IF:  You see a change in shape, contour, or size of your breasts or nipples.   You see skin changes, such as reddened or scaly areas on the breasts or nipples.   You have an unusual discharge from your nipples.   You feel a new lump or unusually thick areas.    This information is not intended to replace advice given to you by your health care provider. Make sure you discuss any questions you have with your health care provider.   Document Released: 11/05/2005 Document Revised: 10/22/2012 Document Reviewed: 02/20/2012 Elsevier Interactive Patient Education [employer].

## 2016-02-06 NOTE — Progress Notes (Signed)
Date:  02/06/2016   Name:  Laura Adkins   DOB:  1956/07/27   MRN:  [id number]   Chief Complaint: Annual Exam Laura Adkins is a 60 y.o. female who presents today for her Complete Annual Exam. She feels fairly well. She reports exercising walking. She reports she is sleeping fairly well.   Diabetes She presents for her follow-up diabetic visit. She has type 2 diabetes mellitus. Her disease course has been stable. Pertinent negatives for hypoglycemia include no dizziness, headaches or tremors. Pertinent negatives for diabetes include no chest pain, no fatigue and no weakness. Current diabetic treatment includes oral agent (monotherapy). She is compliant with treatment all of the time. She participates in exercise three times a week. An ACE inhibitor/angiotensin II receptor blocker is being taken. Eye exam is current.  Hypertension This is a chronic problem. The current episode started more than 1 year ago. The problem is unchanged. The problem is controlled. Pertinent negatives include no chest pain, headaches, palpitations or shortness of breath.  Hyperlipidemia This is a chronic problem. The current episode started more than 1 year ago. The problem is controlled. Recent lipid tests were reviewed and are normal. Exacerbating diseases include diabetes. Pertinent negatives include no chest pain or shortness of breath. Current antihyperlipidemic treatment includes statins. The current treatment provides significant improvement of lipids.  CAD - last seen in the fall.  She is due for a 6 month follow up.  No medication changes made.  She is walking and doing some sit ups.   Lab Results  Component Value Date   HGBA1C 6.9* 10/10/2015     Review of Systems  Constitutional: Negative for fever, chills and fatigue.  HENT: Positive for hearing loss. Negative for sore throat, tinnitus and trouble swallowing.   Eyes: Negative for visual disturbance.  Respiratory: Negative for cough,  chest tightness, shortness of breath and wheezing.   Cardiovascular: Positive for leg swelling (when she eats too much salt). Negative for chest pain and palpitations.  Gastrointestinal: Negative for abdominal pain, diarrhea, constipation and blood in stool.  Genitourinary: Negative for dysuria, hematuria, vaginal bleeding and vaginal discharge.  Skin: Negative for rash.  Neurological: Negative for dizziness, tremors, weakness, numbness and headaches.  Hematological: Negative for adenopathy.  Psychiatric/Behavioral: Negative for sleep disturbance and dysphoric mood.    Patient Active Problem List   Diagnosis Date Noted  . Controlled type 2 diabetes mellitus without complication, without long-term current use of insulin ([place]) 10/10/2015  . Coronary artery disease of native artery of native heart with stable angina pectoris ([place]) 10/10/2015  . Fibrocystic breast 05/05/2015  . Dyslipidemia 05/05/2015  . Essential (primary) hypertension 05/05/2015  . Migraine without aura and responsive to treatment 05/05/2015  . Obstructive apnea 05/05/2015  . Arthritis of knee, degenerative 05/05/2015  . Posterior tibial tendinitis 05/05/2015    Prior to Admission medications   Medication Sig Start Date End Date Taking? Authorizing Provider  aspirin 81 MG chewable tablet Chew 1 tablet by mouth daily.   Yes Historical Provider, MD  atorvastatin (LIPITOR) 20 MG tablet TAKE 1 TAB AT BEDTIME 08/11/15  Yes Glean Hess, MD  Blood Glucose Monitoring Suppl (ONE TOUCH ULTRA SYSTEM KIT) w/Device KIT 1 kit by Does not apply route once. 12/19/15  Yes Glean Hess, MD  Calcium Carbonate-Vit D-Min (CALTRATE 600+D PLUS MINERALS) 600-800 MG-UNIT TABS Take 1 tablet by mouth daily.   Yes Historical Provider, MD  furosemide (LASIX) 40 MG tablet Take 1 tablet by  mouth daily. 05/16/15  Yes Historical Provider, MD  glucose blood (ONE TOUCH TEST STRIPS) test strip Use to test blood sugar daily 12/15/15  Yes Glean Hess, MD  KLOR-CON M20 20 MEQ tablet Take 1 tablet by mouth daily. 05/14/15  Yes Historical Provider, MD  Lancets (ONETOUCH ULTRASOFT) lancets 1 each by Other route daily. Use as instructed 01/06/16  Yes Glean Hess, MD  losartan (COZAAR) 100 MG tablet Take 1 tablet by mouth daily. 05/16/15  Yes Historical Provider, MD  metFORMIN (GLUCOPHAGE) 500 MG tablet TAKE 1 TABLET EVERY DAY 06/09/15  Yes Glean Hess, MD  metoprolol succinate (TOPROL-XL) 25 MG 24 hr tablet Take 12.5 mg by mouth daily. 05/01/15  Yes Historical Provider, MD  Multiple Vitamins tablet Take by mouth.   Yes Historical Provider, MD  nystatin cream (MYCOSTATIN) APPLY 1 APPLICATION DAILY 15/7/26  Yes Glean Hess, MD    Allergies  Allergen Reactions  . Ace Inhibitors Other (See Comments)  . Orange Oil Other (See Comments)    Unable to tolerate it.   . Celebrex  [Celecoxib] Rash    Past Surgical History  Procedure Laterality Date  . Breast surgery  05/1997    necrosis    Social History  Substance Use Topics  . Smoking status: Never Smoker   . Smokeless tobacco: None  . Alcohol Use: No     Medication list has been reviewed and updated.   Physical Exam  Constitutional: She is oriented to person, place, and time. She appears well-developed and well-nourished. No distress.  HENT:  Head: Normocephalic and atraumatic.  Right Ear: Decreased hearing is noted.  Left Ear: Decreased hearing is noted.  Nose: Right sinus exhibits no maxillary sinus tenderness. Left sinus exhibits no maxillary sinus tenderness.  Mouth/Throat: Uvula is midline and oropharynx is clear and moist.  Eyes: Conjunctivae and EOM are normal. Right eye exhibits no discharge. Left eye exhibits no discharge. No scleral icterus.  Neck: Normal range of motion. Carotid bruit is not present. No erythema present. No thyromegaly present.  Cardiovascular: Normal rate, regular rhythm, normal heart sounds and normal pulses.   Pulmonary/Chest:  Effort normal. No respiratory distress. She has no wheezes. Right breast exhibits no mass, no nipple discharge, no skin change and no tenderness. Left breast exhibits no mass, no nipple discharge, no skin change and no tenderness.  Abdominal: Soft. Bowel sounds are normal. There is no hepatosplenomegaly. There is no tenderness. There is no CVA tenderness.  Genitourinary: Vagina normal and uterus normal. No breast swelling, tenderness, discharge or bleeding. There is no tenderness, lesion or injury on the right labia. There is no tenderness, lesion or injury on the left labia. Cervix exhibits friability. Cervix exhibits no motion tenderness and no discharge. Right adnexum displays no mass, no tenderness and no fullness. Left adnexum displays no mass, no tenderness and no fullness.  Musculoskeletal: Normal range of motion. She exhibits no edema or tenderness.  Lymphadenopathy:    She has no cervical adenopathy.    She has no axillary adenopathy.  Neurological: She is alert and oriented to person, place, and time. She has normal strength and normal reflexes. No cranial nerve deficit or sensory deficit.  Skin: Skin is warm, dry and intact. No rash noted.  Psychiatric: She has a normal mood and affect. Her speech is normal and behavior is normal. Thought content normal.  Nursing note and vitals reviewed.   BP 118/62 mmHg  Pulse 68  Ht 5' 3"  (1.6 m)  Wt 224 lb 3.2 oz (101.696 kg)  BMI 39.73 kg/m2  Assessment and Plan: 1. Annual physical exam Mammogram up to date Colonoscopy due in 2019  2. Essential (primary) hypertension controlled - POCT urinalysis dipstick - CBC with Differential/Platelet - TSH  3. Controlled type 2 diabetes mellitus without complication, without long-term current use of insulin (HCC) stable - Microalbumin / creatinine urine ratio - Comprehensive metabolic panel - Hemoglobin A1c  4. Dyslipidemia On appropriate statin therapy - Lipid panel  5. Coronary artery  disease of native artery of native heart with stable angina pectoris ([place]) Stable on current medication Follow up with Cardiology  6. Need for diphtheria-tetanus-pertussis (Tdap) vaccine  7. Need for hepatitis C screening test - Hepatitis C antibody  8. Low grade squamous intraepithelial lesion (LGSIL) on Papanicolaou smear of cervix Repeated today - Pap last year HPV negative - Pap IG (Image Guided)   Halina Maidens, MD [facility]  02/06/2016

## 2016-02-07 LAB — MICROALBUMIN / CREATININE URINE RATIO
CREATININE, UR: 221.5 mg/dL
MICROALB/CREAT RATIO: 13.2 mg/g{creat} (ref 0.0–30.0)
MICROALBUM., U, RANDOM: 29.2 ug/mL

## 2016-02-07 LAB — LIPID PANEL
CHOLESTEROL TOTAL: 161 mg/dL (ref 100–199)
Chol/HDL Ratio: 2.6 ratio units (ref 0.0–4.4)
HDL: 61 mg/dL (ref >39)
LDL Calculated: 75 mg/dL (ref 0–99)
Triglycerides: 126 mg/dL (ref 0–149)
VLDL CHOLESTEROL CAL: 25 mg/dL (ref 5–40)

## 2016-02-07 LAB — CBC WITH DIFFERENTIAL/PLATELET
Basophils Absolute: 0 10*3/uL (ref 0.0–0.2)
Basos: 1 %
EOS (ABSOLUTE): 0.1 10*3/uL (ref 0.0–0.4)
Eos: 3 %
Hematocrit: 36.2 % (ref 34.0–46.6)
Hemoglobin: 11.4 g/dL (ref 11.1–15.9)
IMMATURE GRANULOCYTES: 0 %
Immature Grans (Abs): 0 10*3/uL (ref 0.0–0.1)
LYMPHS ABS: 2.2 10*3/uL (ref 0.7–3.1)
Lymphs: 49 %
MCH: 25.7 pg — ABNORMAL LOW (ref 26.6–33.0)
MCHC: 31.5 g/dL (ref 31.5–35.7)
MCV: 82 fL (ref 79–97)
MONOS ABS: 0.3 10*3/uL (ref 0.1–0.9)
Monocytes: 6 %
NEUTROS PCT: 41 %
Neutrophils Absolute: 1.8 10*3/uL (ref 1.4–7.0)
PLATELETS: 265 10*3/uL (ref 150–379)
RBC: 4.43 x10E6/uL (ref 3.77–5.28)
RDW: 14.7 % (ref 12.3–15.4)
WBC: 4.4 10*3/uL (ref 3.4–10.8)

## 2016-02-07 LAB — HCV ANTIBODY

## 2016-02-07 LAB — COMPREHENSIVE METABOLIC PANEL
A/G RATIO: 1.4 (ref 1.2–2.2)
ALK PHOS: 99 IU/L (ref 39–117)
ALT: 26 IU/L (ref 0–32)
AST: 19 IU/L (ref 0–40)
Albumin: 4.5 g/dL (ref 3.5–5.5)
BUN/Creatinine Ratio: 13 (ref 9–23)
BUN: 11 mg/dL (ref 6–24)
Bilirubin Total: 0.4 mg/dL (ref 0.0–1.2)
CALCIUM: 9.4 mg/dL (ref 8.7–10.2)
CO2: 23 mmol/L (ref 18–29)
CREATININE: 0.86 mg/dL (ref 0.57–1.00)
Chloride: 100 mmol/L (ref 96–106)
GFR calc Af Amer: 86 mL/min/{1.73_m2} (ref >59)
GFR, EST NON AFRICAN AMERICAN: 75 mL/min/{1.73_m2} (ref >59)
GLOBULIN, TOTAL: 3.2 g/dL (ref 1.5–4.5)
Glucose: 114 mg/dL — ABNORMAL HIGH (ref 65–99)
POTASSIUM: 4.4 mmol/L (ref 3.5–5.2)
SODIUM: 140 mmol/L (ref 134–144)
Total Protein: 7.7 g/dL (ref 6.0–8.5)

## 2016-02-07 LAB — HEMOGLOBIN A1C
ESTIMATED AVERAGE GLUCOSE: 151 mg/dL
HEMOGLOBIN A1C: 6.9 % — AB (ref 4.8–5.6)

## 2016-02-07 LAB — TSH: TSH: 3.01 u[IU]/mL (ref 0.450–4.500)

## 2016-02-08 ENCOUNTER — Telehealth: Payer: Self-pay

## 2016-02-08 NOTE — Telephone Encounter (Signed)
Spoke with patient. Patient advised of all results and verbalized understanding. Will call back with any future questions or concerns. MAH  

## 2016-02-08 NOTE — Telephone Encounter (Signed)
-----   Message from Reubin MilanLaura H Berglund, MD sent at 02/08/2016  7:49 AM EDT ----- DM is good.  Cholesterol is excellent.  All other labs are normal.  Continue same medication.

## 2016-02-09 ENCOUNTER — Telehealth: Payer: Self-pay

## 2016-02-09 LAB — PAP IG (IMAGE GUIDED): PAP Smear Comment: 0

## 2016-02-09 NOTE — Telephone Encounter (Signed)
Left message for patient to call back  

## 2016-02-09 NOTE — Telephone Encounter (Signed)
-----   Message from Reubin MilanLaura H Berglund, MD sent at 02/09/2016  9:00 AM EDT ----- Pap smear is negative.

## 2016-02-10 NOTE — Telephone Encounter (Signed)
Spoke with patient. Patient advised of all results and verbalized understanding. Will call back with any future questions or concerns. MAH  

## 2016-03-07 ENCOUNTER — Ambulatory Visit (INDEPENDENT_AMBULATORY_CARE_PROVIDER_SITE_OTHER): Payer: BC Managed Care – PPO | Admitting: Internal Medicine

## 2016-03-07 ENCOUNTER — Encounter: Payer: Self-pay | Admitting: Internal Medicine

## 2016-03-07 VITALS — BP 122/74 | HR 68 | Ht 63.0 in | Wt 223.0 lb

## 2016-03-07 DIAGNOSIS — H60543 Acute eczematoid otitis externa, bilateral: Secondary | ICD-10-CM | POA: Diagnosis not present

## 2016-03-07 MED ORDER — TRIAMCINOLONE ACETONIDE 0.5 % EX OINT: 1.0000 "application " | TOPICAL_OINTMENT | Freq: Two times a day (BID) | CUTANEOUS | Status: DC

## 2016-03-07 NOTE — Progress Notes (Signed)
Date:  03/07/2016   Name:  Laura Adkins   DOB:  January 15, 1956   MRN:  [id number]   Chief Complaint: Rash Rash Pertinent negatives include no fatigue or shortness of breath.  She has her hearing aids checked recently.  She has a scaly rash behind her right pinna and a similar patch over the left tragus.  The audiologist told her it was not from her hearing aids.  She has used no topical agents other than cleaning with alcohol.   Review of Systems  Constitutional: Negative for chills and fatigue.  Respiratory: Negative for chest tightness and shortness of breath.   Cardiovascular: Negative for chest pain.  Skin: Positive for rash.    Patient Active Problem List   Diagnosis Date Noted  . Low grade squamous intraepithelial lesion (LGSIL) on Papanicolaou smear of cervix 02/06/2016  . Controlled type 2 diabetes mellitus without complication, without long-term current use of insulin ([place]) 10/10/2015  . Coronary artery disease of native artery of native heart with stable angina pectoris ([place]) 10/10/2015  . Fibrocystic breast 05/05/2015  . Dyslipidemia 05/05/2015  . Essential (primary) hypertension 05/05/2015  . Migraine without aura and responsive to treatment 05/05/2015  . Obstructive apnea 05/05/2015  . Arthritis of knee, degenerative 05/05/2015  . Posterior tibial tendinitis 05/05/2015    Prior to Admission medications   Medication Sig Start Date End Date Taking? Authorizing Provider  aspirin 81 MG chewable tablet Chew 1 tablet by mouth daily.   Yes Historical Provider, MD  atorvastatin (LIPITOR) 20 MG tablet TAKE 1 TAB AT BEDTIME 08/11/15  Yes Glean Hess, MD  Blood Glucose Monitoring Suppl (ONE TOUCH ULTRA SYSTEM KIT) w/Device KIT 1 kit by Does not apply route once. 12/19/15  Yes Glean Hess, MD  Calcium Carbonate-Vit D-Min (CALTRATE 600+D PLUS MINERALS) 600-800 MG-UNIT TABS Take 1 tablet by mouth daily.   Yes Historical Provider, MD  furosemide (LASIX) 40 MG  tablet Take 1 tablet by mouth daily. 05/16/15  Yes Historical Provider, MD  glucose blood (ONE TOUCH TEST STRIPS) test strip Use to test blood sugar daily 12/15/15  Yes Glean Hess, MD  KLOR-CON M20 20 MEQ tablet Take 1 tablet by mouth daily. 05/14/15  Yes Historical Provider, MD  Lancets (ONETOUCH ULTRASOFT) lancets 1 each by Other route daily. Use as instructed 01/06/16  Yes Glean Hess, MD  losartan (COZAAR) 100 MG tablet Take 1 tablet by mouth daily. 05/16/15  Yes Historical Provider, MD  metFORMIN (GLUCOPHAGE) 500 MG tablet TAKE 1 TABLET EVERY DAY 06/09/15  Yes Glean Hess, MD  metoprolol succinate (TOPROL-XL) 25 MG 24 hr tablet Take 12.5 mg by mouth daily. 05/01/15  Yes Historical Provider, MD  Multiple Vitamins tablet Take by mouth.   Yes Historical Provider, MD  nystatin cream (MYCOSTATIN) APPLY 1 APPLICATION DAILY 56/4/33  Yes Glean Hess, MD    Allergies  Allergen Reactions  . Ace Inhibitors Other (See Comments)  . Orange Oil Other (See Comments)    Unable to tolerate it.   . Celebrex  [Celecoxib] Rash    Past Surgical History  Procedure Laterality Date  . Breast surgery  05/1997    necrosis  . Colonoscopy  04/2008    Social History  Substance Use Topics  . Smoking status: Never Smoker   . Smokeless tobacco: None  . Alcohol Use: No    Medication list has been reviewed and updated.   Physical Exam  Constitutional: She appears well-developed and well-nourished.  Cardiovascular: Normal rate, regular rhythm and normal heart sounds.   Pulmonary/Chest: Effort normal and breath sounds normal.  Skin:  Scaly dark patch behind right upper pinna.  Scaly patch left tragus.  Nursing note and vitals reviewed.   BP 122/74 mmHg  Pulse 68  Ht 5' 3"  (1.6 m)  Wt 223 lb (101.152 kg)  BMI 39.51 kg/m2  Assessment and Plan: 1. Eczema of external ear, bilateral If no improvement, will refer to Dermatology - triamcinolone ointment (KENALOG) 0.5 %; Apply 1  application topically 2 (two) times daily. To rash on external ears  Dispense: 30 g; Refill: 0   Halina Maidens, MD [facility]  03/07/2016

## 2016-06-07 ENCOUNTER — Ambulatory Visit (INDEPENDENT_AMBULATORY_CARE_PROVIDER_SITE_OTHER): Payer: BC Managed Care – PPO | Admitting: Internal Medicine

## 2016-06-07 ENCOUNTER — Encounter: Payer: Self-pay | Admitting: Internal Medicine

## 2016-06-07 VITALS — BP 118/72 | HR 60 | Resp 16 | Ht 63.0 in | Wt 219.0 lb

## 2016-06-07 DIAGNOSIS — E119 Type 2 diabetes mellitus without complications: Secondary | ICD-10-CM | POA: Diagnosis not present

## 2016-06-07 DIAGNOSIS — E785 Hyperlipidemia, unspecified: Secondary | ICD-10-CM

## 2016-06-07 DIAGNOSIS — I10 Essential (primary) hypertension: Secondary | ICD-10-CM

## 2016-06-07 DIAGNOSIS — I25118 Atherosclerotic heart disease of native coronary artery with other forms of angina pectoris: Secondary | ICD-10-CM

## 2016-06-07 MED ORDER — METFORMIN HCL 500 MG PO TABS: 500.0000 mg | ORAL_TABLET | Freq: Every day | ORAL | Status: DC

## 2016-06-07 NOTE — Patient Instructions (Signed)

## 2016-06-07 NOTE — Progress Notes (Signed)
Date:  06/07/2016   Name:  Laura Adkins   DOB:  08/17/1956   MRN:  [id number]   Chief Complaint: Diabetes Diabetes She presents for her follow-up diabetic visit. She has type 2 diabetes mellitus. Her disease course has been stable. Pertinent negatives for hypoglycemia include no headaches or tremors. Pertinent negatives for diabetes include no chest pain, no fatigue, no polydipsia and no polyuria. Pertinent negatives for diabetic complications include no CVA or retinopathy. Current diabetic treatment includes oral agent (monotherapy). She is compliant with treatment all of the time. She is following a generally healthy diet. She monitors blood glucose at home 1-2 x per day. Her breakfast blood glucose is taken between 7-8 am. Her breakfast blood glucose range is generally 110-130 mg/dl.  Hypertension This is a chronic problem. The current episode started more than 1 year ago. The problem is unchanged. Pertinent negatives include no chest pain, headaches, palpitations or shortness of breath. Past treatments include beta blockers and angiotensin blockers. The current treatment provides significant improvement. Hypertensive end-organ damage includes CAD/MI. There is no history of kidney disease, CVA or retinopathy.  CAD - stable with no chest pain or SOB.  She has not had follow up with Cardiology.  She is not exercising.  Lab Results  Component Value Date   HGBA1C 6.9* 02/06/2016    Review of Systems  Constitutional: Negative for fever, appetite change, fatigue and unexpected weight change.  HENT: Positive for hearing loss. Negative for tinnitus and trouble swallowing.   Eyes: Negative for visual disturbance.  Respiratory: Negative for cough, chest tightness, shortness of breath and wheezing.   Cardiovascular: Negative for chest pain, palpitations and leg swelling.  Gastrointestinal: Negative for abdominal pain.  Endocrine: Negative for polydipsia and polyuria.  Genitourinary:  Negative for dysuria and hematuria.  Musculoskeletal: Negative for joint swelling and arthralgias.  Skin: Negative for rash.  Neurological: Negative for tremors, numbness and headaches.  Psychiatric/Behavioral: Negative for sleep disturbance and dysphoric mood.    Patient Active Problem List   Diagnosis Date Noted  . Low grade squamous intraepithelial lesion (LGSIL) on Papanicolaou smear of cervix 02/06/2016  . Controlled type 2 diabetes mellitus without complication, without long-term current use of insulin ([place]) 10/10/2015  . Coronary artery disease of native artery of native heart with stable angina pectoris ([place]) 10/10/2015  . Fibrocystic breast 05/05/2015  . Dyslipidemia 05/05/2015  . Essential (primary) hypertension 05/05/2015  . Migraine without aura and responsive to treatment 05/05/2015  . Obstructive apnea 05/05/2015  . Arthritis of knee, degenerative 05/05/2015  . Posterior tibial tendinitis 05/05/2015    Prior to Admission medications   Medication Sig Start Date End Date Taking? Authorizing Provider  aspirin 81 MG chewable tablet Chew 1 tablet by mouth daily.   Yes Historical Provider, MD  atorvastatin (LIPITOR) 20 MG tablet TAKE 1 TAB AT BEDTIME 08/11/15  Yes Glean Hess, MD  Blood Glucose Monitoring Suppl (ONE TOUCH ULTRA SYSTEM KIT) w/Device KIT 1 kit by Does not apply route once. 12/19/15  Yes Glean Hess, MD  Calcium Carbonate-Vit D-Min (CALTRATE 600+D PLUS MINERALS) 600-800 MG-UNIT TABS Take 1 tablet by mouth daily.   Yes Historical Provider, MD  furosemide (LASIX) 40 MG tablet Take 1 tablet by mouth daily. 05/16/15  Yes Historical Provider, MD  glucose blood (ONE TOUCH TEST STRIPS) test strip Use to test blood sugar daily 12/15/15  Yes Glean Hess, MD  KLOR-CON M20 20 MEQ tablet Take 1 tablet by mouth daily. 05/14/15  Yes Historical Provider, MD  Lancets [facility] ULTRASOFT) lancets 1 each by Other route daily. Use as instructed 01/06/16  Yes Glean Hess, MD  losartan (COZAAR) 100 MG tablet Take 1 tablet by mouth daily. 05/16/15  Yes Historical Provider, MD  metFORMIN (GLUCOPHAGE) 500 MG tablet TAKE 1 TABLET EVERY DAY 06/09/15  Yes Glean Hess, MD  metoprolol succinate (TOPROL-XL) 25 MG 24 hr tablet Take 12.5 mg by mouth daily. 05/01/15  Yes Historical Provider, MD  Multiple Vitamins tablet Take by mouth.   Yes Historical Provider, MD    Allergies  Allergen Reactions  . Ace Inhibitors Other (See Comments)  . Orange Oil Other (See Comments)    Unable to tolerate it.   . Celebrex  [Celecoxib] Rash    Past Surgical History  Procedure Laterality Date  . Breast surgery  05/1997    necrosis  . Colonoscopy  04/2008    Social History  Substance Use Topics  . Smoking status: Never Smoker   . Smokeless tobacco: None  . Alcohol Use: No     Medication list has been reviewed and updated.   Physical Exam  Constitutional: She is oriented to person, place, and time. She appears well-developed and well-nourished. No distress.  HENT:  Head: Normocephalic and atraumatic.  Neck: Normal range of motion. Neck supple. No thyromegaly present.  Cardiovascular: Normal rate, regular rhythm and normal heart sounds.   Pulmonary/Chest: Effort normal and breath sounds normal. No respiratory distress. She has no wheezes.  Musculoskeletal: Normal range of motion. She exhibits edema (trace edema). She exhibits no tenderness.  Lymphadenopathy:    She has no cervical adenopathy.  Neurological: She is alert and oriented to person, place, and time.  Skin: Skin is warm and dry. No rash noted.  Psychiatric: She has a normal mood and affect. Her behavior is normal. Thought content normal.    BP 118/72 mmHg  Pulse 60  Resp 16  Ht 5' 3"  (1.6 m)  Wt 219 lb (99.338 kg)  BMI 38.80 kg/m2  Assessment and Plan: 1. Controlled type 2 diabetes mellitus without complication, without long-term current use of insulin (HCC) Controlled - need to begin  regular exercise - metFORMIN (GLUCOPHAGE) 500 MG tablet; Take 1 tablet (500 mg total) by mouth daily.  Dispense: 30 tablet; Refill: 11 - Hemoglobin A1c  2. Essential (primary) hypertension controlled  3. Coronary artery disease of native artery of native heart with stable angina pectoris ([place]) Recommend follow up with Cardiology Regular exercise  4. Dyslipidemia On statin therapy   Halina Maidens, MD [facility]  06/07/2016

## 2016-06-08 LAB — HEMOGLOBIN A1C
Est. average glucose Bld gHb Est-mCnc: 146 mg/dL
HEMOGLOBIN A1C: 6.7 % — AB (ref 4.8–5.6)

## 2016-07-12 ENCOUNTER — Encounter: Payer: Self-pay | Admitting: Family Medicine

## 2016-07-12 ENCOUNTER — Ambulatory Visit (INDEPENDENT_AMBULATORY_CARE_PROVIDER_SITE_OTHER): Payer: BC Managed Care – PPO | Admitting: Family Medicine

## 2016-07-12 VITALS — BP 120/80 | HR 64 | Ht 63.0 in | Wt 213.0 lb

## 2016-07-12 DIAGNOSIS — L259 Unspecified contact dermatitis, unspecified cause: Secondary | ICD-10-CM

## 2016-07-12 MED ORDER — PREDNISONE 10 MG PO TABS: 10.0000 mg | ORAL_TABLET | Freq: Every day | ORAL | 0 refills | Status: DC

## 2016-07-12 MED ORDER — TRIAMCINOLONE ACETONIDE 0.1 % EX CREA: 1.0000 "application " | TOPICAL_CREAM | Freq: Two times a day (BID) | CUTANEOUS | 0 refills | Status: DC

## 2016-07-12 NOTE — Progress Notes (Signed)
Name: Laura Adkins   MRN: [id number]    DOB: 1956-06-09   Date:07/12/2016       Progress Note  Subjective  Chief Complaint  Chief Complaint  Patient presents with  . Rash    breaks out with little red bumps on arms and neck - using Benadryl and otc cream for itching- not helping    Rash  This is a new problem. The current episode started in the past 7 days. The problem has been gradually worsening since onset. The affected locations include the neck, left arm and right arm. The rash is characterized by redness and itchiness. She was exposed to nothing. Pertinent negatives include no anorexia, congestion, cough, diarrhea, eye pain, facial edema, fatigue, fever, joint pain, nail changes, rhinorrhea, shortness of breath, sore throat or vomiting. Past treatments include nothing. The treatment provided mild relief.    No problem-specific Assessment & Plan notes found for this encounter.   Past Medical History:  Diagnosis Date  . Diabetes mellitus without complication (HCC)   . Hyperlipidemia   . Hypertension     Past Surgical History:  Procedure Laterality Date  . BREAST SURGERY  05/1997   necrosis  . COLONOSCOPY  04/2008    Family History  Problem Relation Age of Onset  . Heart disease Mother   . Diabetes Father   . Heart disease Father   . Diabetes Brother     Social History   Social History  . Marital status: Single    Spouse name: N/A  . Number of children: N/A  . Years of education: N/A   Occupational History  . Not on file.   Social History Main Topics  . Smoking status: Never Smoker  . Smokeless tobacco: Not on file  . Alcohol use No  . Drug use: No  . Sexual activity: Not on file   Other Topics Concern  . Not on file   Social History Narrative  . No narrative on file    Allergies  Allergen Reactions  . Ace Inhibitors Other (See Comments)  . Orange Oil Other (See Comments)    Unable to tolerate it.   . Celebrex  [Celecoxib] Rash      Review of Systems  Constitutional: Negative for chills, fatigue, fever, malaise/fatigue and weight loss.  HENT: Negative for congestion, ear discharge, ear pain, rhinorrhea and sore throat.   Eyes: Negative for blurred vision and pain.  Respiratory: Negative for cough, sputum production, shortness of breath and wheezing.   Cardiovascular: Negative for chest pain, palpitations and leg swelling.  Gastrointestinal: Negative for abdominal pain, anorexia, blood in stool, constipation, diarrhea, heartburn, melena, nausea and vomiting.  Genitourinary: Negative for dysuria, frequency, hematuria and urgency.  Musculoskeletal: Negative for back pain, joint pain, myalgias and neck pain.  Skin: Positive for itching and rash. Negative for nail changes.  Neurological: Negative for dizziness, tingling, sensory change, focal weakness and headaches.  Endo/Heme/Allergies: Negative for environmental allergies and polydipsia. Does not bruise/bleed easily.  Psychiatric/Behavioral: Negative for depression and suicidal ideas. The patient is not nervous/anxious and does not have insomnia.      Objective  Vitals:   07/12/16 1337  BP: 120/80  Pulse: 64  Weight: 213 lb (96.6 kg)  Height: 5\' 3"  (1.6 m)    Physical Exam  Constitutional: She is well-developed, well-nourished, and in no distress. No distress.  HENT:  Head: Normocephalic and atraumatic.  Right Ear: External ear normal.  Left Ear: External ear normal.  Nose: Nose normal.  Mouth/Throat: Oropharynx is clear and moist.  Eyes: Conjunctivae and EOM are normal. Pupils are equal, round, and reactive to light. Right eye exhibits no discharge. Left eye exhibits no discharge.  Neck: Normal range of motion. Neck supple. No JVD present. No thyromegaly present.  Cardiovascular: Normal rate, regular rhythm, normal heart sounds and intact distal pulses.  Exam reveals no gallop and no friction rub.   No murmur heard. Pulmonary/Chest: Effort normal and  breath sounds normal.  Abdominal: Soft. Bowel sounds are normal. She exhibits no mass. There is no tenderness. There is no guarding.  Musculoskeletal: Normal range of motion. She exhibits no edema.  Lymphadenopathy:    She has no cervical adenopathy.  Neurological: She is alert. She has normal reflexes.  Skin: Skin is warm and dry. Rash noted. Rash is maculopapular. She is not diaphoretic. There is erythema.  Psychiatric: Mood and affect normal.  Nursing note and vitals reviewed.     Assessment & Plan  Problem List Items Addressed This Visit    None    Visit Diagnoses    Contact dermatitis    -  Primary   Relevant Medications   predniSONE (DELTASONE) 10 MG tablet   triamcinolone cream (KENALOG) 0.1 %        Dr. Hayden Rasmusseneanna Jones [facility]  07/12/16

## 2016-08-06 ENCOUNTER — Encounter: Payer: Self-pay | Admitting: Internal Medicine

## 2016-08-06 ENCOUNTER — Ambulatory Visit (INDEPENDENT_AMBULATORY_CARE_PROVIDER_SITE_OTHER): Payer: BC Managed Care – PPO | Admitting: Internal Medicine

## 2016-08-06 VITALS — BP 120/80 | HR 80 | Ht 63.0 in | Wt 219.0 lb

## 2016-08-06 DIAGNOSIS — R21 Rash and other nonspecific skin eruption: Secondary | ICD-10-CM

## 2016-08-06 DIAGNOSIS — E119 Type 2 diabetes mellitus without complications: Secondary | ICD-10-CM | POA: Diagnosis not present

## 2016-08-06 DIAGNOSIS — I10 Essential (primary) hypertension: Secondary | ICD-10-CM | POA: Diagnosis not present

## 2016-08-06 MED ORDER — METFORMIN HCL 500 MG PO TABS: 1000.0000 mg | ORAL_TABLET | Freq: Every day | ORAL | 11 refills | Status: DC

## 2016-08-06 NOTE — Progress Notes (Signed)
Date:  08/06/2016   Name:  Laura Adkins   DOB:  1956/02/03   MRN:  [id number]   Chief Complaint: Diabetes (was 200 this am at home- "running from 180- 265") Diabetes  She presents for her follow-up diabetic visit. She has type 2 diabetes mellitus. Her disease course has been fluctuating. Pertinent negatives for hypoglycemia include no headaches or tremors. Pertinent negatives for diabetes include no blurred vision, no chest pain, no fatigue, no foot paresthesias, no polydipsia, no polyphagia and no polyuria. There are no hypoglycemic complications. Symptoms are stable. Current diabetic treatments: also had recent prednisone taper. Her weight is stable. She is following a generally healthy diet. Her breakfast blood glucose is taken between 7-8 am. Her breakfast blood glucose range is generally 130-140 mg/dl.  Rash  This is a new problem. The current episode started 1 to 4 weeks ago. The problem has been waxing and waning since onset. The affected locations include the right arm, left arm and back. The rash is characterized by itchiness. She was exposed to nothing. Pertinent negatives include no cough, fatigue, fever or shortness of breath. Past treatments include anti-itch cream and oral steroids. The treatment provided no relief.  Hypertension  This is a chronic problem. The current episode started more than 1 year ago. The problem is unchanged. The problem is controlled. Pertinent negatives include no blurred vision, chest pain, headaches, palpitations or shortness of breath.    Lab Results  Component Value Date   HGBA1C 6.7 (H) 06/07/2016     Review of Systems  Constitutional: Negative for appetite change, fatigue, fever and unexpected weight change.  HENT: Negative for tinnitus and trouble swallowing.   Eyes: Negative for blurred vision and visual disturbance.  Respiratory: Negative for cough, chest tightness and shortness of breath.   Cardiovascular: Negative for chest pain,  palpitations and leg swelling.  Gastrointestinal: Negative for abdominal pain.  Endocrine: Negative for polydipsia, polyphagia and polyuria.  Genitourinary: Negative for dysuria and hematuria.  Musculoskeletal: Negative for arthralgias.  Skin: Positive for rash.  Neurological: Negative for tremors, numbness and headaches.  Psychiatric/Behavioral: Negative for dysphoric mood.    Patient Active Problem List   Diagnosis Date Noted  . Low grade squamous intraepithelial lesion (LGSIL) on Papanicolaou smear of cervix 02/06/2016  . Controlled type 2 diabetes mellitus without complication, without long-term current use of insulin ([place]) 10/10/2015  . Coronary artery disease of native artery of native heart with stable angina pectoris ([place]) 10/10/2015  . Fibrocystic breast 05/05/2015  . Dyslipidemia 05/05/2015  . Essential (primary) hypertension 05/05/2015  . Migraine without aura and responsive to treatment 05/05/2015  . Obstructive apnea 05/05/2015  . Arthritis of knee, degenerative 05/05/2015  . Posterior tibial tendinitis 05/05/2015    Prior to Admission medications   Medication Sig Start Date End Date Taking? Authorizing Provider  aspirin 81 MG chewable tablet Chew 1 tablet by mouth daily.   Yes Historical Provider, MD  atorvastatin (LIPITOR) 20 MG tablet TAKE 1 TAB AT BEDTIME 08/11/15  Yes Glean Hess, MD  Blood Glucose Monitoring Suppl (ONE TOUCH ULTRA SYSTEM KIT) w/Device KIT 1 kit by Does not apply route once. 12/19/15  Yes Glean Hess, MD  Calcium Carbonate-Vit D-Min (CALTRATE 600+D PLUS MINERALS) 600-800 MG-UNIT TABS Take 1 tablet by mouth daily.   Yes Historical Provider, MD  furosemide (LASIX) 40 MG tablet Take 1 tablet by mouth daily. 05/16/15  Yes Historical Provider, MD  glucose blood (ONE TOUCH TEST STRIPS) test strip  Use to test blood sugar daily 12/15/15  Yes Glean Hess, MD  KLOR-CON M20 20 MEQ tablet Take 1 tablet by mouth daily. 05/14/15  Yes Historical Provider,  MD  Lancets (ONETOUCH ULTRASOFT) lancets 1 each by Other route daily. Use as instructed 01/06/16  Yes Glean Hess, MD  losartan (COZAAR) 100 MG tablet Take 1 tablet by mouth daily. 05/16/15  Yes Historical Provider, MD  metFORMIN (GLUCOPHAGE) 500 MG tablet Take 1 tablet (500 mg total) by mouth daily. 06/07/16  Yes Glean Hess, MD  metoprolol succinate (TOPROL-XL) 25 MG 24 hr tablet Take 12.5 mg by mouth daily. 05/01/15  Yes Historical Provider, MD  Multiple Vitamins tablet Take by mouth.   Yes Historical Provider, MD  triamcinolone cream (KENALOG) 0.1 % Apply 1 application topically 2 (two) times daily. 07/12/16  Yes Juline Patch, MD    Allergies  Allergen Reactions  . Ace Inhibitors Other (See Comments)  . Orange Oil Other (See Comments)    Unable to tolerate it.   . Celebrex  [Celecoxib] Rash    Past Surgical History:  Procedure Laterality Date  . BREAST SURGERY  05/1997   necrosis  . COLONOSCOPY  04/2008    Social History  Substance Use Topics  . Smoking status: Never Smoker  . Smokeless tobacco: Not on file  . Alcohol use No     Medication list has been reviewed and updated.   Physical Exam  Constitutional: She is oriented to person, place, and time. She appears well-developed. No distress.  HENT:  Head: Normocephalic and atraumatic.  Neck: Normal range of motion. Neck supple.  Cardiovascular: Normal rate, regular rhythm and normal heart sounds.   Pulmonary/Chest: Effort normal and breath sounds normal. No respiratory distress.  Abdominal: Soft. There is no tenderness.  Musculoskeletal: Normal range of motion. She exhibits edema. She exhibits no tenderness.  Neurological: She is alert and oriented to person, place, and time.  Skin: Skin is warm and dry. Rash noted.  Fine papular rash on arms, back and left flank   Psychiatric: She has a normal mood and affect. Her behavior is normal. Thought content normal.  Nursing note and vitals reviewed.   BP 120/80    Pulse 80   Ht _0  (1.6 m)   Wt 219 lb (99.3 kg)   BMI 38.79 kg/m   Assessment and Plan: 1. Controlled type 2 diabetes mellitus without complication, without long-term current use of insulin (HCC) Increase dose of metformin until sugars improved Follow up 2 months - metFORMIN (GLUCOPHAGE) 500 MG tablet; Take 2 tablets (1,000 mg total) by mouth daily.  Dispense: 60 tablet; Refill: 11  2. Essential (primary) hypertension controlled  3. Rash and nonspecific skin eruption Refer to Dermatology   Halina Maidens, MD [facility]  08/06/2016

## 2016-08-06 NOTE — Patient Instructions (Addendum)
Consult Dermatology - Dr. Dellia CloudBressler [phone] or                                        Dr. Deloria LairHamilton [phone] in [place]

## 2016-08-07 ENCOUNTER — Other Ambulatory Visit: Payer: Self-pay | Admitting: Internal Medicine

## 2016-09-14 ENCOUNTER — Encounter: Payer: Self-pay | Admitting: Internal Medicine

## 2016-09-14 LAB — HM DIABETES EYE EXAM

## 2016-09-24 ENCOUNTER — Telehealth: Payer: Self-pay

## 2016-09-24 NOTE — Telephone Encounter (Signed)
I have not seen any notes to that effect.

## 2016-09-24 NOTE — Telephone Encounter (Signed)
No answer x2 

## 2016-09-24 NOTE — Telephone Encounter (Signed)
Seen by dermatology 2 times since 09/03/2016 and [facility] and said they sent notes to PCP that she is allergic to 2 meds we have her on. I advised patient I do not see notes from them at this time. Have you seen them?

## 2016-10-08 ENCOUNTER — Encounter: Payer: Self-pay | Admitting: Internal Medicine

## 2016-10-08 ENCOUNTER — Ambulatory Visit (INDEPENDENT_AMBULATORY_CARE_PROVIDER_SITE_OTHER): Payer: BC Managed Care – PPO | Admitting: Internal Medicine

## 2016-10-08 VITALS — BP 128/86 | HR 80 | Ht 63.0 in | Wt 216.0 lb

## 2016-10-08 DIAGNOSIS — Z23 Encounter for immunization: Secondary | ICD-10-CM

## 2016-10-08 DIAGNOSIS — E785 Hyperlipidemia, unspecified: Secondary | ICD-10-CM | POA: Diagnosis not present

## 2016-10-08 DIAGNOSIS — I25118 Atherosclerotic heart disease of native coronary artery with other forms of angina pectoris: Secondary | ICD-10-CM | POA: Diagnosis not present

## 2016-10-08 DIAGNOSIS — E119 Type 2 diabetes mellitus without complications: Secondary | ICD-10-CM

## 2016-10-08 DIAGNOSIS — R21 Rash and other nonspecific skin eruption: Secondary | ICD-10-CM | POA: Diagnosis not present

## 2016-10-08 DIAGNOSIS — I10 Essential (primary) hypertension: Secondary | ICD-10-CM

## 2016-10-08 NOTE — Patient Instructions (Signed)
Hold Metoprolol for one month - monitor the rash to see what happens.  If there is no change in the rash, restart the metoprolol. Call me if BP gets over 145/90.

## 2016-10-08 NOTE — Progress Notes (Signed)
Date:  10/08/2016   Name:  Laura Adkins   DOB:  December 07, 1955   MRN:  [id number]   Chief Complaint: Follow-up (diabetes and rash- saw derm for rash and was tested for lupus- negative, was told to ask if the "metoprolol was causing the rash") and Flu Vaccine  Diabetes  Pertinent negatives for hypoglycemia include no headaches or tremors. Pertinent negatives for diabetes include no chest pain, no fatigue, no polydipsia and no polyuria. Her breakfast blood glucose is taken between 6-7 am.  Rash  This is a chronic problem. The problem is unchanged. Pertinent negatives include no cough, fatigue, fever or shortness of breath. Past treatments include antihistamine and anti-itch cream (Bx negative,  SLE test negative).  Hypertension  This is a chronic problem. The problem is unchanged. The problem is controlled (avg BP 130/80). Pertinent negatives include no chest pain, headaches, palpitations or shortness of breath. Past treatments include angiotensin blockers, beta blockers and diuretics.  Hyperlipidemia  This is a chronic problem. Condition status: patient wonders if this could be causing rash. Pertinent negatives include no chest pain or shortness of breath.  Dermatology thinks that it could be drug reaction to either metoprolol or lasix.  Lab Results  Component Value Date   HGBA1C 6.7 (H) 06/07/2016     Review of Systems  Constitutional: Negative for appetite change, fatigue, fever and unexpected weight change.  HENT: Negative for tinnitus and trouble swallowing.   Eyes: Negative for visual disturbance.  Respiratory: Negative for cough, chest tightness and shortness of breath.   Cardiovascular: Negative for chest pain, palpitations and leg swelling.  Gastrointestinal: Negative for abdominal pain.  Endocrine: Negative for polydipsia and polyuria.  Genitourinary: Negative for dysuria and hematuria.  Musculoskeletal: Negative for arthralgias.  Skin: Positive for rash.    Neurological: Negative for tremors, numbness and headaches.  Psychiatric/Behavioral: Negative for dysphoric mood.    Patient Active Problem List   Diagnosis Date Noted  . Low grade squamous intraepithelial lesion (LGSIL) on Papanicolaou smear of cervix 02/06/2016  . Controlled type 2 diabetes mellitus without complication, without long-term current use of insulin ([place]) 10/10/2015  . Coronary artery disease of native artery of native heart with stable angina pectoris ([place]) 10/10/2015  . Fibrocystic breast 05/05/2015  . Dyslipidemia 05/05/2015  . Essential (primary) hypertension 05/05/2015  . Migraine without aura and responsive to treatment 05/05/2015  . Obstructive apnea 05/05/2015  . Arthritis of knee, degenerative 05/05/2015  . Posterior tibial tendinitis 05/05/2015    Prior to Admission medications   Medication Sig Start Date End Date Taking? Authorizing Provider  aspirin 81 MG chewable tablet Chew 1 tablet by mouth daily.   Yes Historical Provider, MD  atorvastatin (LIPITOR) 20 MG tablet TAKE 1 TAB AT BEDTIME 08/07/16  Yes Glean Hess, MD  Blood Glucose Monitoring Suppl (ONE TOUCH ULTRA SYSTEM KIT) w/Device KIT 1 kit by Does not apply route once. 12/19/15  Yes Glean Hess, MD  Calcium Carbonate-Vit D-Min (CALTRATE 600+D PLUS MINERALS) 600-800 MG-UNIT TABS Take 1 tablet by mouth daily.   Yes Historical Provider, MD  furosemide (LASIX) 40 MG tablet Take 1 tablet by mouth daily. 05/16/15  Yes Historical Provider, MD  glucose blood (ONE TOUCH TEST STRIPS) test strip Use to test blood sugar daily 12/15/15  Yes Glean Hess, MD  KLOR-CON M20 20 MEQ tablet Take 1 tablet by mouth daily. 05/14/15  Yes Historical Provider, MD  Lancets (ONETOUCH ULTRASOFT) lancets 1 each by Other route daily. Use  as instructed 01/06/16  Yes Glean Hess, MD  losartan (COZAAR) 100 MG tablet Take 1 tablet by mouth daily. 05/16/15  Yes Historical Provider, MD  metFORMIN (GLUCOPHAGE) 500 MG tablet Take  2 tablets (1,000 mg total) by mouth daily. 08/06/16  Yes Glean Hess, MD  metoprolol succinate (TOPROL-XL) 25 MG 24 hr tablet Take 12.5 mg by mouth daily. 05/01/15  Yes Historical Provider, MD  Multiple Vitamins tablet Take by mouth.   Yes Historical Provider, MD  triamcinolone cream (KENALOG) 0.1 % Apply 1 application topically 2 (two) times daily. 07/12/16  Yes Juline Patch, MD    Allergies  Allergen Reactions  . Ace Inhibitors Other (See Comments)  . Orange Oil Other (See Comments)    Unable to tolerate it.   . Celebrex  [Celecoxib] Rash    Past Surgical History:  Procedure Laterality Date  . BREAST SURGERY  05/1997   necrosis  . COLONOSCOPY  04/2008    Social History  Substance Use Topics  . Smoking status: Never Smoker  . Smokeless tobacco: Not on file  . Alcohol use No     Medication list has been reviewed and updated.   Physical Exam  Constitutional: She is oriented to person, place, and time. She appears well-developed. No distress.  HENT:  Head: Normocephalic and atraumatic.  Neck: Normal range of motion. Neck supple. No thyromegaly present.  Cardiovascular: Normal rate, regular rhythm and normal heart sounds.   Pulmonary/Chest: Effort normal and breath sounds normal. No respiratory distress. She has no wheezes. She has no rales.  Musculoskeletal: Normal range of motion.  Neurological: She is alert and oriented to person, place, and time.  Skin: Skin is warm and dry. Rash noted. Rash is papular.  papular lesions scattered over neck, shoulder and arms  Psychiatric: She has a normal mood and affect. Her behavior is normal. Thought content normal.  Nursing note and vitals reviewed.   BP 128/86   Pulse 80   Ht 5' 3"  (1.6 m)   Wt 216 lb (98 kg)   BMI 38.26 kg/m   Assessment and Plan: 1. Controlled type 2 diabetes mellitus without complication, without long-term current use of insulin (HCC) Continue current medication - Hemoglobin A1c  2. Essential  (primary) hypertension controlled - Comprehensive metabolic panel  3. Rash Hold metoprolol for one month  4. Dyslipidemia On statin therapy  5. Need for influenza vaccination - Flu Vaccine QUAD 36+ mos IM  6. Coronary artery disease of native artery of native heart with stable angina pectoris ([place]) Stable - call if BP >145/90 or other problems   Halina Maidens, MD [facility]  10/08/2016

## 2016-10-09 LAB — COMPREHENSIVE METABOLIC PANEL
ALT: 18 IU/L (ref 0–32)
AST: 13 IU/L (ref 0–40)
Albumin/Globulin Ratio: 1.4 (ref 1.2–2.2)
Albumin: 4.2 g/dL (ref 3.5–5.5)
Alkaline Phosphatase: 89 IU/L (ref 39–117)
BUN/Creatinine Ratio: 14 (ref 9–23)
BUN: 13 mg/dL (ref 6–24)
Bilirubin Total: 0.3 mg/dL (ref 0.0–1.2)
CALCIUM: 9.3 mg/dL (ref 8.7–10.2)
CO2: 27 mmol/L (ref 18–29)
CREATININE: 0.93 mg/dL (ref 0.57–1.00)
Chloride: 102 mmol/L (ref 96–106)
GFR calc Af Amer: 78 mL/min/{1.73_m2} (ref >59)
GFR, EST NON AFRICAN AMERICAN: 68 mL/min/{1.73_m2} (ref >59)
GLOBULIN, TOTAL: 2.9 g/dL (ref 1.5–4.5)
Glucose: 111 mg/dL — ABNORMAL HIGH (ref 65–99)
Potassium: 4.4 mmol/L (ref 3.5–5.2)
Sodium: 142 mmol/L (ref 134–144)
TOTAL PROTEIN: 7.1 g/dL (ref 6.0–8.5)

## 2016-10-09 LAB — HEMOGLOBIN A1C
ESTIMATED AVERAGE GLUCOSE: 157 mg/dL
HEMOGLOBIN A1C: 7.1 % — AB (ref 4.8–5.6)

## 2017-01-19 ENCOUNTER — Other Ambulatory Visit: Payer: Self-pay | Admitting: Internal Medicine

## 2017-02-14 ENCOUNTER — Encounter: Payer: Self-pay | Admitting: Internal Medicine

## 2017-02-14 ENCOUNTER — Other Ambulatory Visit: Payer: Self-pay | Admitting: Internal Medicine

## 2017-02-14 ENCOUNTER — Ambulatory Visit (INDEPENDENT_AMBULATORY_CARE_PROVIDER_SITE_OTHER): Payer: BC Managed Care – PPO | Admitting: Internal Medicine

## 2017-02-14 VITALS — BP 124/80 | HR 62 | Ht 63.0 in | Wt 211.0 lb

## 2017-02-14 DIAGNOSIS — I10 Essential (primary) hypertension: Secondary | ICD-10-CM | POA: Diagnosis not present

## 2017-02-14 DIAGNOSIS — I25118 Atherosclerotic heart disease of native coronary artery with other forms of angina pectoris: Secondary | ICD-10-CM | POA: Diagnosis not present

## 2017-02-14 DIAGNOSIS — E785 Hyperlipidemia, unspecified: Secondary | ICD-10-CM | POA: Diagnosis not present

## 2017-02-14 DIAGNOSIS — R87612 Low grade squamous intraepithelial lesion on cytologic smear of cervix (LGSIL): Secondary | ICD-10-CM

## 2017-02-14 DIAGNOSIS — Z0000 Encounter for general adult medical examination without abnormal findings: Secondary | ICD-10-CM

## 2017-02-14 DIAGNOSIS — E119 Type 2 diabetes mellitus without complications: Secondary | ICD-10-CM

## 2017-02-14 LAB — POCT URINALYSIS DIPSTICK
BILIRUBIN UA: NEGATIVE
GLUCOSE UA: NEGATIVE
Nitrite, UA: NEGATIVE
PH UA: 7 (ref 5.0–8.0)
RBC UA: NEGATIVE
Spec Grav, UA: 1.01 (ref 1.030–1.035)
Urobilinogen, UA: 0.2 (ref ?–2.0)

## 2017-02-14 NOTE — Patient Instructions (Signed)
Health Maintenance  Topic Date Due  . HIV Screening  02/05/2021 (Originally 10/30/1972)  . HEMOGLOBIN A1C  04/07/2017  . OPHTHALMOLOGY EXAM  09/14/2017  . MAMMOGRAM  12/10/2017  . FOOT EXAM  02/14/2018  . COLONOSCOPY  04/30/2018  . PNEUMOCOCCAL POLYSACCHARIDE VACCINE (2) 06/04/2018  . PAP SMEAR  02/06/2019  . TETANUS/TDAP  02/05/2026  . INFLUENZA VACCINE  Completed  . Hepatitis C Screening  Completed

## 2017-02-14 NOTE — Progress Notes (Signed)
Date:  02/14/2017   Name:  Laura Adkins   DOB:  10-Jun-1956   MRN:  [id number]   Chief Complaint: Annual Exam (would like pap to be checked out. Breast exam.) Laura Adkins is a 61 y.o. female who presents today for her Complete Annual Exam. She feels well. She reports exercising none. She reports she is sleeping well.   Diabetes  She presents for her follow-up diabetic visit. She has type 2 diabetes mellitus. Her disease course has been stable. Pertinent negatives for hypoglycemia include no dizziness, headaches, nervousness/anxiousness or tremors. Pertinent negatives for diabetes include no chest pain, no fatigue, no polydipsia and no polyuria. Symptoms are stable. Diabetic complications include heart disease. Pertinent negatives for diabetic complications include no nephropathy or PVD. Current diabetic treatment includes oral agent (monotherapy). She is compliant with treatment all of the time. Her weight is stable. She is following a generally healthy diet.  Hypertension  This is a chronic problem. The problem is unchanged. The problem is controlled. Pertinent negatives include no chest pain, headaches, palpitations or shortness of breath. There is no history of PVD.  Hyperlipidemia  This is a chronic problem. Pertinent negatives include no chest pain or shortness of breath.  OSA - using CPAP almost every night. CAD - seen earlier this month by Cardiology.  Medications unchanged,  No labs done.  No stress test recommended.  Review of Systems  Constitutional: Negative for chills, fatigue and fever.  HENT: Positive for hearing loss. Negative for congestion, tinnitus, trouble swallowing and voice change.   Eyes: Negative for visual disturbance.  Respiratory: Negative for cough, chest tightness, shortness of breath and wheezing.   Cardiovascular: Negative for chest pain, palpitations and leg swelling.  Gastrointestinal: Negative for abdominal pain, constipation, diarrhea  and vomiting.  Endocrine: Negative for polydipsia and polyuria.  Genitourinary: Negative for dysuria, frequency, genital sores, vaginal bleeding and vaginal discharge.  Musculoskeletal: Negative for arthralgias, gait problem and joint swelling.  Skin: Negative for color change and rash.  Neurological: Negative for dizziness, tremors, light-headedness and headaches.  Hematological: Negative for adenopathy. Does not bruise/bleed easily.  Psychiatric/Behavioral: Negative for dysphoric mood and sleep disturbance. The patient is not nervous/anxious.     Patient Active Problem List   Diagnosis Date Noted  . Low grade squamous intraepithelial lesion (LGSIL) on Papanicolaou smear of cervix 02/06/2016  . Controlled type 2 diabetes mellitus without complication, without long-term current use of insulin ([place]) 10/10/2015  . Coronary artery disease of native artery of native heart with stable angina pectoris ([place]) 10/10/2015  . Fibrocystic breast 05/05/2015  . Dyslipidemia 05/05/2015  . Essential (primary) hypertension 05/05/2015  . Migraine without aura and responsive to treatment 05/05/2015  . Obstructive apnea 05/05/2015  . Arthritis of knee, degenerative 05/05/2015  . Posterior tibial tendinitis 05/05/2015    Prior to Admission medications   Medication Sig Start Date End Date Taking? Authorizing Provider  aspirin 81 MG chewable tablet Chew 1 tablet by mouth daily.   Yes Historical Provider, MD  atorvastatin (LIPITOR) 20 MG tablet TAKE 1 TAB AT BEDTIME 08/07/16  Yes Glean Hess, MD  Blood Glucose Monitoring Suppl (ONE TOUCH ULTRA SYSTEM KIT) w/Device KIT 1 kit by Does not apply route once. 12/19/15  Yes Glean Hess, MD  Calcium Carbonate-Vit D-Min (CALTRATE 600+D PLUS MINERALS) 600-800 MG-UNIT TABS Take 1 tablet by mouth daily.   Yes Historical Provider, MD  KLOR-CON M20 20 MEQ tablet Take 1 tablet by mouth daily. 05/14/15  Yes Historical Provider, MD  Lancets [facility] ULTRASOFT) lancets  1 each by Other route daily. Use as instructed 01/06/16  Yes Glean Hess, MD  losartan (COZAAR) 100 MG tablet Take 1 tablet by mouth daily. 05/16/15  Yes Historical Provider, MD  metFORMIN (GLUCOPHAGE) 500 MG tablet Take 2 tablets (1,000 mg total) by mouth daily. 08/06/16  Yes Glean Hess, MD  Multiple Vitamins tablet Take by mouth.   Yes Historical Provider, MD  ONE TOUCH ULTRA TEST test strip USE TO TEST BLOOD SUGAR DAILY 01/19/17  Yes Glean Hess, MD  triamcinolone cream (KENALOG) 0.1 % Apply 1 application topically 2 (two) times daily. 07/12/16  Yes Juline Patch, MD  torsemide (DEMADEX) 20 MG tablet Take 1 tablet by mouth daily. 02/10/17   Historical Provider, MD    Allergies  Allergen Reactions  . Ace Inhibitors Other (See Comments)  . Orange Oil Other (See Comments)    Unable to tolerate it.   . Celebrex  [Celecoxib] Rash    Past Surgical History:  Procedure Laterality Date  . BREAST SURGERY  05/1997   necrosis  . COLONOSCOPY  04/2008    Social History  Substance Use Topics  . Smoking status: Never Smoker  . Smokeless tobacco: Never Used  . Alcohol use No     Medication list has been reviewed and updated.   Physical Exam  Constitutional: She is oriented to person, place, and time. She appears well-developed and well-nourished. No distress.  HENT:  Head: Normocephalic and atraumatic.  Right Ear: Tympanic membrane and ear canal normal.  Left Ear: Tympanic membrane and ear canal normal.  Nose: Right sinus exhibits no maxillary sinus tenderness. Left sinus exhibits no maxillary sinus tenderness.  Mouth/Throat: Uvula is midline and oropharynx is clear and moist.  Eyes: Conjunctivae and EOM are normal. Right eye exhibits no discharge. Left eye exhibits no discharge. No scleral icterus.  Neck: Normal range of motion. Carotid bruit is not present. No erythema present. No thyromegaly present.  Cardiovascular: Normal rate, regular rhythm, normal heart sounds and  normal pulses.   Pulmonary/Chest: Effort normal. No respiratory distress. She has no wheezes. Right breast exhibits no mass, no nipple discharge, no skin change and no tenderness. Left breast exhibits no mass, no nipple discharge, no skin change and no tenderness.  Abdominal: Soft. Bowel sounds are normal. There is no hepatosplenomegaly. There is no tenderness. There is no CVA tenderness.  Genitourinary: Vagina normal and uterus normal. There is no rash, tenderness or lesion on the right labia. There is no rash, tenderness or lesion on the left labia. Cervix exhibits friability. Cervix exhibits no motion tenderness and no discharge. Right adnexum displays no mass, no tenderness and no fullness. Left adnexum displays no mass, no tenderness and no fullness.  Musculoskeletal: Normal range of motion.  Lymphadenopathy:    She has no cervical adenopathy.    She has no axillary adenopathy.  Neurological: She is alert and oriented to person, place, and time. She has normal reflexes. No cranial nerve deficit or sensory deficit.  Skin: Skin is warm, dry and intact. No rash noted.  Psychiatric: She has a normal mood and affect. Her speech is normal and behavior is normal. Thought content normal.  Nursing note and vitals reviewed.   BP 124/80 (BP Location: Right Arm, Patient Position: Sitting, Cuff Size: Large)   Pulse 62   Ht _0  (1.6 m)   Wt 211 lb (95.7 kg)   BMI 37.38 kg/m  Assessment and Plan: 1. Annual physical exam Continue exercise, diet changes and weight loss - POCT urinalysis dipstick  2. Coronary artery disease of native artery of native heart with stable angina pectoris ([place]) Stable; continue current medications  3. Essential (primary) hypertension controlled  4. Controlled type 2 diabetes mellitus without complication, without long-term current use of insulin (HCC) Stable - continue diet and exercise with medications - CBC with Differential/Platelet - Comprehensive metabolic  panel - Microalbumin / creatinine urine ratio - TSH - Hemoglobin A1c  5. Dyslipidemia On statin therapy - Lipid panel  6. Low grade squamous intraepithelial lesion (LGSIL) on Papanicolaou smear of cervix - Pap IG (Image Guided)   No orders of the defined types were placed in this encounter.   Halina Maidens, MD [facility]  02/14/2017

## 2017-02-15 LAB — COMPREHENSIVE METABOLIC PANEL
A/G RATIO: 1.3 (ref 1.2–2.2)
ALBUMIN: 4.4 g/dL (ref 3.5–5.5)
ALT: 30 IU/L (ref 0–32)
AST: 23 IU/L (ref 0–40)
Alkaline Phosphatase: 98 IU/L (ref 39–117)
BILIRUBIN TOTAL: 0.4 mg/dL (ref 0.0–1.2)
BUN / CREAT RATIO: 14 (ref 9–23)
BUN: 14 mg/dL (ref 6–24)
CHLORIDE: 99 mmol/L (ref 96–106)
CO2: 26 mmol/L (ref 18–29)
Calcium: 9.4 mg/dL (ref 8.7–10.2)
Creatinine, Ser: 0.98 mg/dL (ref 0.57–1.00)
GFR calc non Af Amer: 63 mL/min/{1.73_m2} (ref >59)
GFR, EST AFRICAN AMERICAN: 73 mL/min/{1.73_m2} (ref >59)
GLOBULIN, TOTAL: 3.3 g/dL (ref 1.5–4.5)
Glucose: 110 mg/dL — ABNORMAL HIGH (ref 65–99)
POTASSIUM: 4.4 mmol/L (ref 3.5–5.2)
SODIUM: 140 mmol/L (ref 134–144)
TOTAL PROTEIN: 7.7 g/dL (ref 6.0–8.5)

## 2017-02-15 LAB — CBC WITH DIFFERENTIAL/PLATELET
BASOS: 1 %
Basophils Absolute: 0.1 10*3/uL (ref 0.0–0.2)
EOS (ABSOLUTE): 0.1 10*3/uL (ref 0.0–0.4)
Eos: 3 %
HEMATOCRIT: 35.2 % (ref 34.0–46.6)
HEMOGLOBIN: 11.6 g/dL (ref 11.1–15.9)
IMMATURE GRANS (ABS): 0 10*3/uL (ref 0.0–0.1)
Immature Granulocytes: 0 %
LYMPHS ABS: 2.2 10*3/uL (ref 0.7–3.1)
LYMPHS: 46 %
MCH: 26.1 pg — AB (ref 26.6–33.0)
MCHC: 33 g/dL (ref 31.5–35.7)
MCV: 79 fL (ref 79–97)
MONOCYTES: 8 %
Monocytes Absolute: 0.4 10*3/uL (ref 0.1–0.9)
NEUTROS ABS: 2 10*3/uL (ref 1.4–7.0)
Neutrophils: 42 %
Platelets: 271 10*3/uL (ref 150–379)
RBC: 4.45 x10E6/uL (ref 3.77–5.28)
RDW: 14.8 % (ref 12.3–15.4)
WBC: 4.8 10*3/uL (ref 3.4–10.8)

## 2017-02-15 LAB — HEMOGLOBIN A1C
ESTIMATED AVERAGE GLUCOSE: 146 mg/dL
Hgb A1c MFr Bld: 6.7 % — ABNORMAL HIGH (ref 4.8–5.6)

## 2017-02-15 LAB — LIPID PANEL
Chol/HDL Ratio: 2.6 ratio units (ref 0.0–4.4)
Cholesterol, Total: 167 mg/dL (ref 100–199)
HDL: 64 mg/dL (ref >39)
LDL Calculated: 81 mg/dL (ref 0–99)
Triglycerides: 112 mg/dL (ref 0–149)
VLDL CHOLESTEROL CAL: 22 mg/dL (ref 5–40)

## 2017-02-15 LAB — TSH: TSH: 3.02 u[IU]/mL (ref 0.450–4.500)

## 2017-02-16 LAB — MICROALBUMIN / CREATININE URINE RATIO
Creatinine, Urine: 248.1 mg/dL
MICROALB/CREAT RATIO: 6.5 mg/g{creat} (ref 0.0–30.0)
Microalbumin, Urine: 16.2 ug/mL

## 2017-02-16 LAB — PLEASE NOTE

## 2017-02-17 LAB — PAP IG (IMAGE GUIDED): PAP Smear Comment: 0

## 2017-04-18 ENCOUNTER — Other Ambulatory Visit: Payer: Self-pay | Admitting: Internal Medicine

## 2017-04-18 ENCOUNTER — Other Ambulatory Visit: Payer: Self-pay | Admitting: Family Medicine

## 2017-04-18 DIAGNOSIS — L259 Unspecified contact dermatitis, unspecified cause: Secondary | ICD-10-CM

## 2017-04-18 DIAGNOSIS — E119 Type 2 diabetes mellitus without complications: Secondary | ICD-10-CM

## 2017-06-24 ENCOUNTER — Encounter: Payer: Self-pay | Admitting: Internal Medicine

## 2017-06-24 ENCOUNTER — Ambulatory Visit (INDEPENDENT_AMBULATORY_CARE_PROVIDER_SITE_OTHER): Payer: BC Managed Care – PPO | Admitting: Internal Medicine

## 2017-06-24 VITALS — BP 126/78 | HR 56 | Ht 63.0 in | Wt 210.0 lb

## 2017-06-24 DIAGNOSIS — I10 Essential (primary) hypertension: Secondary | ICD-10-CM | POA: Diagnosis not present

## 2017-06-24 DIAGNOSIS — E785 Hyperlipidemia, unspecified: Secondary | ICD-10-CM | POA: Diagnosis not present

## 2017-06-24 DIAGNOSIS — E119 Type 2 diabetes mellitus without complications: Secondary | ICD-10-CM | POA: Diagnosis not present

## 2017-06-24 DIAGNOSIS — I25118 Atherosclerotic heart disease of native coronary artery with other forms of angina pectoris: Secondary | ICD-10-CM

## 2017-06-24 DIAGNOSIS — G4733 Obstructive sleep apnea (adult) (pediatric): Secondary | ICD-10-CM

## 2017-06-24 NOTE — Progress Notes (Signed)
Date:  06/24/2017   Name:  Laura Adkins   DOB:  1956/03/03   MRN:  [id number]   Chief Complaint: Diabetes (BS- this morning 141. ) and Hypertension (Bp this morning was 142/84) Diabetes  She presents for her follow-up diabetic visit. She has type 2 diabetes mellitus. Pertinent negatives for hypoglycemia include no headaches or tremors. Pertinent negatives for diabetes include no chest pain, no fatigue, no polydipsia and no polyuria. Current diabetic treatment includes oral agent (monotherapy). She is compliant with treatment all of the time. Her breakfast blood glucose is taken between 6-7 am. Her breakfast blood glucose range is generally 130-140 mg/dl. An ACE inhibitor/angiotensin II receptor blocker is being taken. Eye exam is current.  Hypertension  This is a chronic problem. The problem is controlled. Pertinent negatives include no chest pain, headaches, palpitations or shortness of breath. Past treatments include angiotensin blockers and diuretics.  OSA -  Using CPAP every night and feels that she is sleeping better.  Gets ~7 hours per night. CAD - not exercising regularly but not having any chest pain with exertion.  Cath in 2016 - 60% Circ and distal LAD - treated medically.  Taking aspirin and statin. Due for cardiology followup.  Lab Results  Component Value Date   HGBA1C 6.7 (H) 02/14/2017   Lab Results  Component Value Date   CREATININE 0.98 02/14/2017   Lab Results  Component Value Date   CHOL 167 02/14/2017   HDL 64 02/14/2017   LDLCALC 81 02/14/2017   TRIG 112 02/14/2017   CHOLHDL 2.6 02/14/2017    Review of Systems  Constitutional: Negative for appetite change, fatigue, fever and unexpected weight change.  HENT: Negative for tinnitus and trouble swallowing.   Eyes: Negative for visual disturbance.  Respiratory: Negative for cough, chest tightness and shortness of breath.   Cardiovascular: Negative for chest pain, palpitations and leg swelling.    Gastrointestinal: Negative for abdominal pain.  Endocrine: Negative for polydipsia and polyuria.  Genitourinary: Negative for dysuria and hematuria.  Musculoskeletal: Negative for arthralgias.  Neurological: Negative for tremors, numbness and headaches.  Psychiatric/Behavioral: Negative for dysphoric mood.    Patient Active Problem List   Diagnosis Date Noted  . Low grade squamous intraepithelial lesion (LGSIL) on Papanicolaou smear of cervix 02/06/2016  . Controlled type 2 diabetes mellitus without complication, without long-term current use of insulin ([place]) 10/10/2015  . Coronary artery disease of native artery of native heart with stable angina pectoris ([place]) 10/10/2015  . Fibrocystic breast 05/05/2015  . Dyslipidemia 05/05/2015  . Essential (primary) hypertension 05/05/2015  . Migraine without aura and responsive to treatment 05/05/2015  . Obstructive apnea 05/05/2015  . Arthritis of knee, degenerative 05/05/2015  . Posterior tibial tendinitis 05/05/2015    Prior to Admission medications   Medication Sig Start Date End Date Taking? Authorizing Provider  aspirin 81 MG chewable tablet Chew 1 tablet by mouth daily.    [provider]  atorvastatin (LIPITOR) 20 MG tablet TAKE 1 TAB AT BEDTIME 08/07/16   Glean Hess, MD  Blood Glucose Monitoring Suppl (ONE TOUCH ULTRA SYSTEM KIT) w/Device KIT 1 kit by Does not apply route once. 12/19/15   Glean Hess, MD  Calcium Carbonate-Vit D-Min (CALTRATE 600+D PLUS MINERALS) 600-800 MG-UNIT TABS Take 1 tablet by mouth daily.    [provider]  KLOR-CON M20 20 MEQ tablet Take 1 tablet by mouth daily. 05/14/15   [provider]  losartan (COZAAR) 100 MG tablet Take 1  tablet by mouth daily. 05/16/15   [provider]  metFORMIN (GLUCOPHAGE) 500 MG tablet Take 2 tablets (1,000 mg total) by mouth daily. 08/06/16   Glean Hess, MD  Multiple Vitamins tablet Take by mouth.    [provider]   ONE TOUCH ULTRA TEST test strip USE TO TEST BLOOD SUGAR DAILY 01/19/17   Glean Hess, MD  [facility] DELICA LANCETS 59F MISC USE ONCE DAILY AS DIRECTED 04/18/17   Glean Hess, MD  torsemide (DEMADEX) 20 MG tablet Take 1 tablet by mouth daily. 02/10/17   [provider]  triamcinolone cream (KENALOG) 0.1 % APPLY 1 APPLICATION TOPICALLY 2 (TWO) TIMES DAILY. 04/18/17   Glean Hess, MD    Allergies  Allergen Reactions  . Ace Inhibitors Other (See Comments)  . Orange Oil Other (See Comments)    Unable to tolerate it.   . Celebrex  [Celecoxib] Rash    Past Surgical History:  Procedure Laterality Date  . BREAST SURGERY  05/1997   necrosis  . COLONOSCOPY  04/2008    Social History  Substance Use Topics  . Smoking status: Never Smoker  . Smokeless tobacco: Never Used  . Alcohol use No   Depression screen [facility] 2/9 06/24/2017 06/07/2016  Decreased Interest 0 0  Down, Depressed, Hopeless 0 0  PHQ - 2 Score 0 0     Medication list has been reviewed and updated.   Physical Exam  Constitutional: She is oriented to person, place, and time. She appears well-developed. No distress.  HENT:  Head: Normocephalic and atraumatic.  Right Ear: Tympanic membrane and ear canal normal. Decreased hearing is noted.  Left Ear: Decreased hearing is noted.  Neck: Normal range of motion. Neck supple. Carotid bruit is not present.  Cardiovascular: Normal rate, regular rhythm and normal heart sounds.   Pulmonary/Chest: Effort normal and breath sounds normal. No respiratory distress. She has no wheezes.  Musculoskeletal: Normal range of motion. She exhibits no edema or tenderness.  Neurological: She is alert and oriented to person, place, and time.  Skin: Skin is warm and dry. No rash noted.  Psychiatric: She has a normal mood and affect. Her speech is normal and behavior is normal. Thought content normal.  Nursing note and vitals reviewed.   BP 126/78   Pulse (!) 56   Ht _0   (1.6 m)   Wt 210 lb (95.3 kg)   SpO2 100%   BMI 37.20 kg/m   Assessment and Plan: 1. Controlled type 2 diabetes mellitus without complication, without long-term current use of insulin (HCC) controlled - Hemoglobin A1c  2. Essential (primary) hypertension stable  3. Dyslipidemia On statin  4. Coronary artery disease of native artery of native heart with stable angina pectoris ([place]) Stable with no chest pains Treated medically - due for cardiology follow up  5. Obstructive apnea Doing well on CPAP - encouraged to use nightly  No orders of the defined types were placed in this encounter.   Halina Maidens, MD [facility]  06/24/2017

## 2017-06-25 LAB — HEMOGLOBIN A1C
ESTIMATED AVERAGE GLUCOSE: 143 mg/dL
HEMOGLOBIN A1C: 6.6 % — AB (ref 4.8–5.6)

## 2017-07-26 ENCOUNTER — Encounter: Payer: Self-pay | Admitting: Internal Medicine

## 2017-07-26 ENCOUNTER — Ambulatory Visit (INDEPENDENT_AMBULATORY_CARE_PROVIDER_SITE_OTHER): Payer: BC Managed Care – PPO | Admitting: Internal Medicine

## 2017-07-26 VITALS — BP 118/80 | HR 57 | Temp 97.8°F | Ht 63.0 in | Wt 209.0 lb

## 2017-07-26 DIAGNOSIS — J019 Acute sinusitis, unspecified: Secondary | ICD-10-CM | POA: Diagnosis not present

## 2017-07-26 DIAGNOSIS — E785 Hyperlipidemia, unspecified: Secondary | ICD-10-CM

## 2017-07-26 MED ORDER — AMOXICILLIN-POT CLAVULANATE 875-125 MG PO TABS: 1.0000 | ORAL_TABLET | Freq: Two times a day (BID) | ORAL | 0 refills | Status: DC

## 2017-07-26 NOTE — Progress Notes (Signed)
Date:  07/26/2017   Name:  Laura Adkins   DOB:  10/21/1956   MRN:  [id number]   Chief Complaint: Sinusitis (Since last friday- been taking 10 mg claritin, tylenol, and aleve.Throat is sore. Congestion- no cough. Thick green mucous. )  Sinusitis  This is a new problem. The current episode started in the past 7 days. The problem has been gradually worsening since onset. There has been no fever. Associated symptoms include congestion, coughing, sinus pressure and a sore throat. Pertinent negatives include no chills or swollen glands. Treatments tried: claritin. The treatment provided mild relief.     Review of Systems  Constitutional: Negative for chills, fatigue and fever.  HENT: Positive for congestion, postnasal drip, sinus pain, sinus pressure, sore throat and trouble swallowing.   Respiratory: Positive for cough. Negative for chest tightness and wheezing.   Cardiovascular: Negative for chest pain.    Patient Active Problem List   Diagnosis Date Noted  . Low grade squamous intraepithelial lesion (LGSIL) on Papanicolaou smear of cervix 02/06/2016  . Controlled type 2 diabetes mellitus without complication, without long-term current use of insulin ([place]) 10/10/2015  . Coronary artery disease of native artery of native heart with stable angina pectoris ([place]) 10/10/2015  . Fibrocystic breast 05/05/2015  . Dyslipidemia 05/05/2015  . Essential (primary) hypertension 05/05/2015  . Migraine without aura and responsive to treatment 05/05/2015  . Obstructive apnea 05/05/2015  . Arthritis of knee, degenerative 05/05/2015  . Posterior tibial tendinitis 05/05/2015    Prior to Admission medications   Medication Sig Start Date End Date Taking? Authorizing Provider  aspirin 81 MG chewable tablet Chew 1 tablet by mouth daily.   Yes [provider]  atorvastatin (LIPITOR) 80 MG tablet Take 80 mg by mouth daily. 07/23/17  Yes [provider]  Blood Glucose Monitoring  Suppl (ONE TOUCH ULTRA SYSTEM KIT) w/Device KIT 1 kit by Does not apply route once. 12/19/15  Yes Glean Hess, MD  Calcium Carbonate-Vit D-Min (CALTRATE 600+D PLUS MINERALS) 600-800 MG-UNIT TABS Take 1 tablet by mouth daily.   Yes [provider]  KLOR-CON M20 20 MEQ tablet Take 1 tablet by mouth daily. 05/14/15  Yes [provider]  losartan (COZAAR) 100 MG tablet Take 1 tablet by mouth daily. 05/16/15  Yes [provider]  metFORMIN (GLUCOPHAGE) 500 MG tablet Take 2 tablets (1,000 mg total) by mouth daily. 08/06/16  Yes Glean Hess, MD  Multiple Vitamins tablet Take by mouth.   Yes [provider]  ONE TOUCH ULTRA TEST test strip USE TO TEST BLOOD SUGAR DAILY 01/19/17  Yes Glean Hess, MD  [facility] DELICA LANCETS 20F MISC USE ONCE DAILY AS DIRECTED 04/18/17  Yes Glean Hess, MD  torsemide (DEMADEX) 20 MG tablet Take 1 tablet by mouth daily. 02/10/17  Yes [provider]  triamcinolone cream (KENALOG) 0.1 % APPLY 1 APPLICATION TOPICALLY 2 (TWO) TIMES DAILY. 04/18/17  Yes Glean Hess, MD    Allergies  Allergen Reactions  . Ace Inhibitors Other (See Comments)  . Orange Oil Other (See Comments)    Unable to tolerate it.   . Celebrex  [Celecoxib] Rash    Past Surgical History:  Procedure Laterality Date  . BREAST SURGERY  05/1997   necrosis  . COLONOSCOPY  04/2008    Social History  Substance Use Topics  . Smoking status: Never Smoker  . Smokeless tobacco: Never Used  . Alcohol use No     Medication  list has been reviewed and updated.  PHQ 2/9 Scores 06/24/2017 06/07/2016  PHQ - 2 Score 0 0    Physical Exam  Constitutional: She is oriented to person, place, and time. She appears well-developed and well-nourished.  HENT:  Nose: Right sinus exhibits maxillary sinus tenderness. Right sinus exhibits no frontal sinus tenderness. Left sinus exhibits maxillary sinus tenderness. Left sinus exhibits no frontal sinus  tenderness.  Mouth/Throat: Uvula is midline and mucous membranes are normal. No oral lesions. Posterior oropharyngeal erythema present. No oropharyngeal exudate or posterior oropharyngeal edema.  Cardiovascular: Normal rate, regular rhythm and normal heart sounds.   Pulmonary/Chest: Breath sounds normal. She has no wheezes. She has no rales.  Lymphadenopathy:    She has no cervical adenopathy.  Neurological: She is alert and oriented to person, place, and time.    BP 118/80   Pulse (!) 57   Temp 97.8 F (36.6 C)   Ht 5' 3"  (1.6 m)   Wt 209 lb (94.8 kg)   SpO2 98%   BMI 37.02 kg/m   Assessment and Plan: 1. Acute non-recurrent sinusitis, unspecified location Continue Claritin - amoxicillin-clavulanate (AUGMENTIN) 875-125 MG tablet; Take 1 tablet by mouth 2 (two) times daily.  Dispense: 20 tablet; Refill: 0  2. Dyslipidemia On lipitor 40 mg with LDL 81 Increase next Rx to 80 mg to achieve LDL < 70   Meds ordered this encounter  Medications  . amoxicillin-clavulanate (AUGMENTIN) 875-125 MG tablet    Sig: Take 1 tablet by mouth 2 (two) times daily.    Dispense:  20 tablet    Refill:  0    Partially dictated using Editor, commissioning. Any errors are unintentional.  Halina Maidens, MD [facility]  07/26/2017

## 2017-08-06 ENCOUNTER — Ambulatory Visit: Payer: BC Managed Care – PPO

## 2017-08-12 ENCOUNTER — Other Ambulatory Visit: Payer: Self-pay | Admitting: Internal Medicine

## 2017-08-12 DIAGNOSIS — E119 Type 2 diabetes mellitus without complications: Secondary | ICD-10-CM

## 2017-08-14 ENCOUNTER — Ambulatory Visit (INDEPENDENT_AMBULATORY_CARE_PROVIDER_SITE_OTHER): Payer: BC Managed Care – PPO

## 2017-08-14 DIAGNOSIS — Z23 Encounter for immunization: Secondary | ICD-10-CM

## 2017-10-28 ENCOUNTER — Ambulatory Visit: Payer: BC Managed Care – PPO | Admitting: Internal Medicine

## 2017-11-01 ENCOUNTER — Ambulatory Visit: Payer: BC Managed Care – PPO | Admitting: Internal Medicine

## 2017-11-06 ENCOUNTER — Ambulatory Visit: Payer: BC Managed Care – PPO | Admitting: Internal Medicine

## 2017-11-06 ENCOUNTER — Encounter: Payer: Self-pay | Admitting: Internal Medicine

## 2017-11-06 VITALS — BP 130/78 | HR 70 | Ht 63.0 in | Wt 210.0 lb

## 2017-11-06 DIAGNOSIS — Z1231 Encounter for screening mammogram for malignant neoplasm of breast: Secondary | ICD-10-CM

## 2017-11-06 DIAGNOSIS — I25118 Atherosclerotic heart disease of native coronary artery with other forms of angina pectoris: Secondary | ICD-10-CM | POA: Diagnosis not present

## 2017-11-06 DIAGNOSIS — E1169 Type 2 diabetes mellitus with other specified complication: Secondary | ICD-10-CM | POA: Diagnosis not present

## 2017-11-06 DIAGNOSIS — E119 Type 2 diabetes mellitus without complications: Secondary | ICD-10-CM

## 2017-11-06 DIAGNOSIS — I10 Essential (primary) hypertension: Secondary | ICD-10-CM

## 2017-11-06 DIAGNOSIS — L259 Unspecified contact dermatitis, unspecified cause: Secondary | ICD-10-CM

## 2017-11-06 DIAGNOSIS — E785 Hyperlipidemia, unspecified: Secondary | ICD-10-CM | POA: Diagnosis not present

## 2017-11-06 DIAGNOSIS — Z1239 Encounter for other screening for malignant neoplasm of breast: Secondary | ICD-10-CM

## 2017-11-06 MED ORDER — TRIAMCINOLONE ACETONIDE 0.1 % EX CREA: 1.0000 "application " | TOPICAL_CREAM | Freq: Two times a day (BID) | CUTANEOUS | 0 refills | Status: DC

## 2017-11-06 NOTE — Progress Notes (Signed)
Date:  11/06/2017   Name:  Laura Adkins   DOB:  [birth year]   MRN:  [id number]   Chief Complaint: Diabetes (This morning- 138) and Hypertension Diabetes  She presents for her follow-up diabetic visit. She has type 2 diabetes mellitus. Her disease course has been stable. Pertinent negatives for hypoglycemia include no headaches or tremors. Pertinent negatives for diabetes include no chest pain, no fatigue, no polydipsia and no polyuria.  Hypertension  This is a chronic problem. The problem is unchanged. The problem is controlled. Pertinent negatives include no chest pain, headaches, palpitations or shortness of breath.  Hyperlipidemia  This is a chronic problem. The problem is controlled. Pertinent negatives include no chest pain or shortness of breath. Current antihyperlipidemic treatment includes statins. Improvement on treatment: lipitor increased to 80 in September.   Lab Results  Component Value Date   HGBA1C 6.6 (H) 06/24/2017      Review of Systems  Constitutional: Negative for appetite change, fatigue, fever and unexpected weight change.  HENT: Negative for tinnitus and trouble swallowing.   Eyes: Negative for visual disturbance.  Respiratory: Negative for cough, chest tightness and shortness of breath.   Cardiovascular: Negative for chest pain, palpitations and leg swelling.  Gastrointestinal: Negative for abdominal pain.  Endocrine: Negative for polydipsia and polyuria.  Genitourinary: Negative for dysuria and hematuria.  Musculoskeletal: Negative for arthralgias and back pain.  Skin: Positive for color change and rash.  Neurological: Negative for tremors, numbness and headaches.  Psychiatric/Behavioral: Negative for dysphoric mood and sleep disturbance.    Patient Active Problem List   Diagnosis Date Noted  . Low grade squamous intraepithelial lesion (LGSIL) on Papanicolaou smear of cervix 02/06/2016  . Controlled type 2 diabetes mellitus without  complication, without long-term current use of insulin ([place]) 10/10/2015  . Coronary artery disease of native artery of native heart with stable angina pectoris ([place]) 10/10/2015  . Fibrocystic breast 05/05/2015  . Dyslipidemia 05/05/2015  . Essential (primary) hypertension 05/05/2015  . Migraine without aura and responsive to treatment 05/05/2015  . Obstructive apnea 05/05/2015  . Arthritis of knee, degenerative 05/05/2015  . Posterior tibial tendinitis 05/05/2015    Prior to Admission medications   Medication Sig Start Date End Date Taking? Authorizing Provider  amoxicillin-clavulanate (AUGMENTIN) 875-125 MG tablet Take 1 tablet by mouth 2 (two) times daily. 07/26/17   Glean Hess, MD  aspirin 81 MG chewable tablet Chew 1 tablet by mouth daily.    [provider]  atorvastatin (LIPITOR) 80 MG tablet Take 80 mg by mouth daily. 07/23/17   [provider]  Blood Glucose Monitoring Suppl (ONE TOUCH ULTRA SYSTEM KIT) w/Device KIT 1 kit by Does not apply route once. 12/19/15   Glean Hess, MD  Calcium Carbonate-Vit D-Min (CALTRATE 600+D PLUS MINERALS) 600-800 MG-UNIT TABS Take 1 tablet by mouth daily.    [provider]  KLOR-CON M20 20 MEQ tablet Take 1 tablet by mouth daily. 05/14/15   [provider]  losartan (COZAAR) 100 MG tablet Take 1 tablet by mouth daily. 05/16/15   [provider]  metFORMIN (GLUCOPHAGE) 500 MG tablet TAKE 2 TABLETS (1,000 MG TOTAL) BY MOUTH DAILY. 08/12/17   Glean Hess, MD  Multiple Vitamins tablet Take by mouth.    [provider]  ONE TOUCH ULTRA TEST test strip USE TO TEST BLOOD SUGAR DAILY 01/19/17   Glean Hess, MD  [facility] DELICA LANCETS 44C MISC USE ONCE DAILY AS DIRECTED 04/18/17  Glean Hess, MD  torsemide (DEMADEX) 20 MG tablet Take 1 tablet by mouth daily. 02/10/17   [provider]  triamcinolone cream (KENALOG) 0.1 % APPLY 1 APPLICATION TOPICALLY 2 (TWO) TIMES DAILY.  04/18/17   Glean Hess, MD    Allergies  Allergen Reactions  . Ace Inhibitors Other (See Comments)  . Orange Oil Other (See Comments)    Unable to tolerate it.   . Celebrex  [Celecoxib] Rash    Past Surgical History:  Procedure Laterality Date  . BREAST SURGERY  05/1997   necrosis  . COLONOSCOPY  04/2008    Social History   Tobacco Use  . Smoking status: Never Smoker  . Smokeless tobacco: Never Used  Substance Use Topics  . Alcohol use: No    Alcohol/week: 0.0 oz  . Drug use: No     Medication list has been reviewed and updated.  PHQ 2/9 Scores 06/24/2017 06/07/2016  PHQ - 2 Score 0 0    Physical Exam  Constitutional: She is oriented to person, place, and time. She appears well-developed. No distress.  HENT:  Head: Normocephalic and atraumatic.  Neck: Normal range of motion. Neck supple.  Cardiovascular: Normal rate, regular rhythm and normal heart sounds.  Pulmonary/Chest: Effort normal and breath sounds normal. No respiratory distress.  Musculoskeletal: Normal range of motion.  Neurological: She is alert and oriented to person, place, and time.  Skin: Skin is warm and dry. No rash noted.  Psychiatric: She has a normal mood and affect. Her behavior is normal. Thought content normal.  Nursing note and vitals reviewed.   BP 130/78   Pulse 70   Ht 5' 3"  (1.6 m)   Wt 210 lb (95.3 kg)   SpO2 100%   BMI 37.20 kg/m   Assessment and Plan: 1. Controlled type 2 diabetes mellitus without complication, without long-term current use of insulin (HCC) Continue diet, recommend regular exercise such as walking - Hemoglobin A1c  2. Essential (primary) hypertension controlled - Comprehensive metabolic panel  3. Coronary artery disease of native artery of native heart with stable angina pectoris ([place]) stable  4. Hyperlipidemia associated with type 2 diabetes mellitus ([place]) Now on atorvastatin 80 mg - Lipid panel  5. Contact dermatitis Add Claritin -  triamcinolone cream (KENALOG) 0.1 %; Apply 1 application topically 2 (two) times daily.  Dispense: 60 g; Refill: 0  6. Breast cancer screening - MM DIGITAL SCREENING BILATERAL   Meds ordered this encounter  Medications  . triamcinolone cream (KENALOG) 0.1 %    Sig: Apply 1 application topically 2 (two) times daily.    Dispense:  60 g    Refill:  0    Partially dictated using Editor, commissioning. Any errors are unintentional.  Halina Maidens, MD [facility]  11/06/2017

## 2017-11-06 NOTE — Patient Instructions (Addendum)
Schedule diabetic Eye exam  Take Claritin 10 mg once a day

## 2017-11-07 LAB — COMPREHENSIVE METABOLIC PANEL
ALK PHOS: 115 IU/L (ref 39–117)
ALT: 28 IU/L (ref 0–32)
AST: 23 IU/L (ref 0–40)
Albumin/Globulin Ratio: 1.5 (ref 1.2–2.2)
Albumin: 4.8 g/dL (ref 3.6–4.8)
BUN/Creatinine Ratio: 9 — ABNORMAL LOW (ref 12–28)
BUN: 8 mg/dL (ref 8–27)
Bilirubin Total: 0.3 mg/dL (ref 0.0–1.2)
CO2: 25 mmol/L (ref 20–29)
CREATININE: 0.85 mg/dL (ref 0.57–1.00)
Calcium: 9.6 mg/dL (ref 8.7–10.3)
Chloride: 99 mmol/L (ref 96–106)
GFR calc Af Amer: 86 mL/min/{1.73_m2} (ref >59)
GFR calc non Af Amer: 75 mL/min/{1.73_m2} (ref >59)
GLOBULIN, TOTAL: 3.2 g/dL (ref 1.5–4.5)
Glucose: 93 mg/dL (ref 65–99)
POTASSIUM: 4.2 mmol/L (ref 3.5–5.2)
SODIUM: 141 mmol/L (ref 134–144)
Total Protein: 8 g/dL (ref 6.0–8.5)

## 2017-11-07 LAB — HEMOGLOBIN A1C
Est. average glucose Bld gHb Est-mCnc: 146 mg/dL
HEMOGLOBIN A1C: 6.7 % — AB (ref 4.8–5.6)

## 2017-11-07 LAB — LIPID PANEL
CHOLESTEROL TOTAL: 153 mg/dL (ref 100–199)
Chol/HDL Ratio: 2.1 ratio (ref 0.0–4.4)
HDL: 72 mg/dL (ref >39)
LDL Calculated: 66 mg/dL (ref 0–99)
Triglycerides: 76 mg/dL (ref 0–149)
VLDL Cholesterol Cal: 15 mg/dL (ref 5–40)

## 2017-11-28 ENCOUNTER — Telehealth: Payer: Self-pay

## 2017-11-28 NOTE — Telephone Encounter (Signed)
Patient called saying she is concerned about BS rising. From January 1st until today its has been higher than normal. Today was 190 fasting. She said its ranging from 143-203.  Please Advise.

## 2017-11-28 NOTE — Telephone Encounter (Signed)
She should only check BS fasting and 2 hours after the end of a meal.  If she checks other times, it may well be elevated.  Her last labs were great so I do not want to change medication at this time.

## 2017-11-28 NOTE — Telephone Encounter (Signed)
Patient informed not to take BS at random times. ONLY fasting in the morning before food and 2 hours after a meal. She verbalized understanding. Will only take it at those times. Will do this through the weekend. If not better by Monday she will call then and be seen for OV.

## 2017-11-29 ENCOUNTER — Telehealth: Payer: Self-pay

## 2017-11-29 NOTE — Telephone Encounter (Signed)
Patient called stating BS is elevated still today. Checked BS fasting this morning it was 184 and two hours after she ate it was 141. Called her back and left her VM to call the office and schedule OV discuss BS.

## 2017-12-16 LAB — HM DIABETES EYE EXAM

## 2018-01-09 ENCOUNTER — Other Ambulatory Visit: Payer: Self-pay | Admitting: Internal Medicine

## 2018-01-09 ENCOUNTER — Other Ambulatory Visit: Payer: Self-pay

## 2018-01-09 MED ORDER — GLUCOSE BLOOD VI STRP: 100.0000 | ORAL_STRIP | Freq: Two times a day (BID) | 3 refills | Status: DC

## 2018-03-07 ENCOUNTER — Ambulatory Visit (INDEPENDENT_AMBULATORY_CARE_PROVIDER_SITE_OTHER): Payer: BC Managed Care – PPO | Admitting: Internal Medicine

## 2018-03-07 ENCOUNTER — Encounter: Payer: Self-pay | Admitting: Internal Medicine

## 2018-03-07 VITALS — BP 118/78 | HR 71 | Ht 63.0 in | Wt 208.0 lb

## 2018-03-07 DIAGNOSIS — E119 Type 2 diabetes mellitus without complications: Secondary | ICD-10-CM | POA: Diagnosis not present

## 2018-03-07 DIAGNOSIS — E1169 Type 2 diabetes mellitus with other specified complication: Secondary | ICD-10-CM

## 2018-03-07 DIAGNOSIS — E785 Hyperlipidemia, unspecified: Secondary | ICD-10-CM

## 2018-03-07 DIAGNOSIS — Z0000 Encounter for general adult medical examination without abnormal findings: Secondary | ICD-10-CM

## 2018-03-07 DIAGNOSIS — I25118 Atherosclerotic heart disease of native coronary artery with other forms of angina pectoris: Secondary | ICD-10-CM

## 2018-03-07 DIAGNOSIS — Z1231 Encounter for screening mammogram for malignant neoplasm of breast: Secondary | ICD-10-CM

## 2018-03-07 DIAGNOSIS — I10 Essential (primary) hypertension: Secondary | ICD-10-CM

## 2018-03-07 DIAGNOSIS — Z1239 Encounter for other screening for malignant neoplasm of breast: Secondary | ICD-10-CM

## 2018-03-07 LAB — POCT URINALYSIS DIPSTICK
Bilirubin, UA: NEGATIVE
Blood, UA: NEGATIVE
Glucose, UA: NEGATIVE
Ketones, UA: NEGATIVE
Leukocytes, UA: NEGATIVE
Nitrite, UA: NEGATIVE
Spec Grav, UA: 1.015
Urobilinogen, UA: 0.2 U/dL
pH, UA: 6.5

## 2018-03-07 NOTE — Progress Notes (Signed)
Date:  03/07/2018   Name:  Laura Adkins   DOB:  December 27, 1955   MRN:  [id number]   Chief Complaint: Annual Exam (Breast Exam. ) and Diabetes Laura Adkins is a 62 y.o. female who presents today for her Complete Annual Exam. She feels well. She reports exercising very little. She reports she is sleeping fairly well.   Diabetes  She presents for her follow-up diabetic visit. She has type 2 diabetes mellitus. Her disease course has been stable. Pertinent negatives for hypoglycemia include no dizziness, headaches, nervousness/anxiousness or tremors. Pertinent negatives for diabetes include no chest pain, no fatigue, no polydipsia and no polyuria. Her weight is stable. She monitors blood glucose at home 1-2 x per day. Her breakfast blood glucose is taken between 6-7 am. Her breakfast blood glucose range is generally 130-140 mg/dl. An ACE inhibitor/angiotensin II receptor blocker is being taken. Eye exam is current.  Hypertension  This is a chronic problem. The problem is controlled. Pertinent negatives include no chest pain, headaches, palpitations or shortness of breath. Past treatments include angiotensin blockers and diuretics. Hypertensive end-organ damage includes CAD/MI. There is no history of chronic renal disease.  Hyperlipidemia  This is a chronic problem. The problem is controlled. She has no history of chronic renal disease. Pertinent negatives include no chest pain or shortness of breath. Current antihyperlipidemic treatment includes statins. The current treatment provides significant improvement of lipids.   Lab Results  Component Value Date   HGBA1C 6.7 (H) 11/06/2017   Lab Results  Component Value Date   CREATININE 0.85 11/06/2017   BUN 8 11/06/2017   NA 141 11/06/2017   K 4.2 11/06/2017   CL 99 11/06/2017   CO2 25 11/06/2017   Lab Results  Component Value Date   CHOL 153 11/06/2017   HDL 72 11/06/2017   LDLCALC 66 11/06/2017   TRIG 76 11/06/2017   CHOLHDL 2.1 11/06/2017      Review of Systems  Constitutional: Negative for chills, fatigue and fever.  HENT: Positive for hearing loss. Negative for congestion, tinnitus, trouble swallowing and voice change.   Eyes: Negative for visual disturbance.  Respiratory: Negative for cough, chest tightness, shortness of breath and wheezing.   Cardiovascular: Negative for chest pain, palpitations and leg swelling.  Gastrointestinal: Negative for abdominal pain, constipation, diarrhea and vomiting.  Endocrine: Negative for polydipsia and polyuria.  Genitourinary: Negative for dysuria, frequency, genital sores, vaginal bleeding and vaginal discharge.  Musculoskeletal: Negative for arthralgias, gait problem and joint swelling.  Skin: Negative for color change and rash.  Neurological: Negative for dizziness, tremors, light-headedness and headaches.  Hematological: Negative for adenopathy. Does not bruise/bleed easily.  Psychiatric/Behavioral: Negative for dysphoric mood and sleep disturbance. The patient is not nervous/anxious.     Patient Active Problem List   Diagnosis Date Noted  . Hyperlipidemia associated with type 2 diabetes mellitus ([place]) 11/06/2017  . Low grade squamous intraepithelial lesion (LGSIL) on Papanicolaou smear of cervix 02/06/2016  . Controlled type 2 diabetes mellitus without complication, without long-term current use of insulin ([place]) 10/10/2015  . Coronary artery disease of native artery of native heart with stable angina pectoris ([place]) 10/10/2015  . Fibrocystic breast 05/05/2015  . Essential (primary) hypertension 05/05/2015  . Migraine without aura and responsive to treatment 05/05/2015  . Obstructive apnea 05/05/2015  . Arthritis of knee, degenerative 05/05/2015  . Posterior tibial tendinitis 05/05/2015    Prior to Admission medications   Medication Sig Start Date End Date Taking? Authorizing Provider  aspirin 81 MG chewable tablet Chew 1 tablet by mouth daily.   Yes  [provider]  atorvastatin (LIPITOR) 80 MG tablet Take 80 mg by mouth daily. 07/23/17  Yes [provider]  Blood Glucose Monitoring Suppl (ONE TOUCH ULTRA SYSTEM KIT) w/Device KIT 1 kit by Does not apply route once. 12/19/15  Yes Glean Hess, MD  Calcium Carbonate-Vit D-Min (CALTRATE 600+D PLUS MINERALS) 600-800 MG-UNIT TABS Take 1 tablet by mouth daily.   Yes [provider]  glucose blood (ONE TOUCH ULTRA TEST) test strip 100 each by Other route 2 (two) times daily. Use to test blood sugar 2 times daily. 01/09/18  Yes Glean Hess, MD  KLOR-CON M20 20 MEQ tablet Take 1 tablet by mouth daily. 05/14/15  Yes [provider]  losartan (COZAAR) 100 MG tablet Take 1 tablet by mouth daily. 05/16/15  Yes [provider]  metFORMIN (GLUCOPHAGE) 500 MG tablet TAKE 2 TABLETS (1,000 MG TOTAL) BY MOUTH DAILY. 08/12/17  Yes Glean Hess, MD  Multiple Vitamins tablet Take by mouth.   Yes [provider]  [facility] DELICA LANCETS 98P MISC USE ONCE DAILY AS DIRECTED 04/18/17  Yes Glean Hess, MD  torsemide (DEMADEX) 20 MG tablet Take 1 tablet by mouth daily. 02/10/17  Yes [provider]  triamcinolone cream (KENALOG) 0.1 % Apply 1 application topically 2 (two) times daily. 11/06/17  Yes Glean Hess, MD    Allergies  Allergen Reactions  . Ace Inhibitors Other (See Comments)  . Orange Oil Other (See Comments)    Unable to tolerate it.   . Celebrex  [Celecoxib] Rash    Past Surgical History:  Procedure Laterality Date  . BREAST SURGERY  05/1997   necrosis  . COLONOSCOPY  04/2008    Social History   Tobacco Use  . Smoking status: Never Smoker  . Smokeless tobacco: Never Used  Substance Use Topics  . Alcohol use: No    Alcohol/week: 0.0 oz  . Drug use: No     Medication list has been reviewed and updated.  PHQ 2/9 Scores 06/24/2017 06/07/2016  PHQ - 2 Score 0 0    Physical Exam  Constitutional: She is  oriented to person, place, and time. She appears well-developed and well-nourished. No distress.  HENT:  Head: Normocephalic and atraumatic.  Right Ear: Tympanic membrane and ear canal normal.  Left Ear: Tympanic membrane and ear canal normal.  Nose: Right sinus exhibits no maxillary sinus tenderness. Left sinus exhibits no maxillary sinus tenderness.  Mouth/Throat: Uvula is midline and oropharynx is clear and moist.  Eyes: Conjunctivae and EOM are normal. Right eye exhibits no discharge. Left eye exhibits no discharge. No scleral icterus.  Neck: Normal range of motion. Carotid bruit is not present. No erythema present. No thyromegaly present.  Cardiovascular: Normal rate, regular rhythm, normal heart sounds and normal pulses.  Pulmonary/Chest: Effort normal. No respiratory distress. She has no wheezes. Right breast exhibits no mass, no nipple discharge, no skin change and no tenderness. Left breast exhibits no mass, no nipple discharge, no skin change and no tenderness.  Abdominal: Soft. Bowel sounds are normal. There is no hepatosplenomegaly. There is no tenderness. There is no CVA tenderness.  Musculoskeletal: Normal range of motion.  Lymphadenopathy:    She has no cervical adenopathy.    She has no axillary adenopathy.  Neurological: She is alert and oriented to person, place, and time. She has normal reflexes. No cranial nerve deficit or sensory  deficit.  Skin: Skin is warm, dry and intact. No rash noted.  Psychiatric: She has a normal mood and affect. Her speech is normal and behavior is normal. Thought content normal.  Nursing note and vitals reviewed.   BP 118/78   Pulse 71   Ht _0  (1.6 m)   Wt 208 lb (94.3 kg)   SpO2 100%   BMI 36.85 kg/m   Assessment and Plan: 1. Annual physical exam Reinforced diet and exercise - POCT urinalysis dipstick  2. Breast cancer screening Done in January  3. Essential (primary) hypertension controlled - CBC with Differential/Platelet -  TSH  4. Controlled type 2 diabetes mellitus without complication, without long-term current use of insulin (HCC) Stable - work harder on diet Continue current medication - Comprehensive metabolic panel - Hemoglobin A1c  5. Hyperlipidemia associated with type 2 diabetes mellitus ([place]) On statin therapy - Lipid panel  6. Coronary artery disease of native artery of native heart with stable angina pectoris ([place]) No recent sx Follow up with Cardiology Continue statin, ACE and diuretic   No orders of the defined types were placed in this encounter.   Partially dictated using Editor, commissioning. Any errors are unintentional.  Halina Maidens, MD [facility]  03/07/2018

## 2018-03-07 NOTE — Patient Instructions (Signed)

## 2018-03-08 LAB — CBC WITH DIFFERENTIAL/PLATELET
BASOS ABS: 0 10*3/uL (ref 0.0–0.2)
Basos: 1 %
EOS (ABSOLUTE): 0.1 10*3/uL (ref 0.0–0.4)
Eos: 2 %
Hematocrit: 34.1 % (ref 34.0–46.6)
Hemoglobin: 11.4 g/dL (ref 11.1–15.9)
IMMATURE GRANULOCYTES: 0 %
Immature Grans (Abs): 0 10*3/uL (ref 0.0–0.1)
LYMPHS ABS: 2.5 10*3/uL (ref 0.7–3.1)
LYMPHS: 48 %
MCH: 26.3 pg — ABNORMAL LOW (ref 26.6–33.0)
MCHC: 33.4 g/dL (ref 31.5–35.7)
MCV: 79 fL (ref 79–97)
MONOCYTES: 6 %
Monocytes Absolute: 0.3 10*3/uL (ref 0.1–0.9)
NEUTROS ABS: 2.2 10*3/uL (ref 1.4–7.0)
Neutrophils: 43 %
PLATELETS: 277 10*3/uL (ref 150–379)
RBC: 4.33 x10E6/uL (ref 3.77–5.28)
RDW: 15.1 % (ref 12.3–15.4)
WBC: 5.1 10*3/uL (ref 3.4–10.8)

## 2018-03-08 LAB — LIPID PANEL
CHOLESTEROL TOTAL: 136 mg/dL (ref 100–199)
Chol/HDL Ratio: 2.1 ratio (ref 0.0–4.4)
HDL: 65 mg/dL (ref >39)
LDL CALC: 56 mg/dL (ref 0–99)
Triglycerides: 75 mg/dL (ref 0–149)
VLDL Cholesterol Cal: 15 mg/dL (ref 5–40)

## 2018-03-08 LAB — COMPREHENSIVE METABOLIC PANEL
ALK PHOS: 113 IU/L (ref 39–117)
ALT: 25 IU/L (ref 0–32)
AST: 23 IU/L (ref 0–40)
Albumin/Globulin Ratio: 1.5 (ref 1.2–2.2)
Albumin: 4.5 g/dL (ref 3.6–4.8)
BUN/Creatinine Ratio: 14 (ref 12–28)
BUN: 15 mg/dL (ref 8–27)
Bilirubin Total: 0.5 mg/dL (ref 0.0–1.2)
CALCIUM: 9.5 mg/dL (ref 8.7–10.3)
CO2: 26 mmol/L (ref 20–29)
CREATININE: 1.05 mg/dL — AB (ref 0.57–1.00)
Chloride: 101 mmol/L (ref 96–106)
GFR calc Af Amer: 67 mL/min/{1.73_m2} (ref >59)
GFR, EST NON AFRICAN AMERICAN: 58 mL/min/{1.73_m2} — AB (ref >59)
GLUCOSE: 104 mg/dL — AB (ref 65–99)
Globulin, Total: 3 g/dL (ref 1.5–4.5)
Potassium: 4.2 mmol/L (ref 3.5–5.2)
Sodium: 143 mmol/L (ref 134–144)
Total Protein: 7.5 g/dL (ref 6.0–8.5)

## 2018-03-08 LAB — TSH: TSH: 2.91 u[IU]/mL (ref 0.450–4.500)

## 2018-03-08 LAB — HEMOGLOBIN A1C
ESTIMATED AVERAGE GLUCOSE: 157 mg/dL
Hgb A1c MFr Bld: 7.1 % — ABNORMAL HIGH (ref 4.8–5.6)

## 2018-04-19 HISTORY — PX: COLONOSCOPY: SHX174

## 2018-04-22 ENCOUNTER — Other Ambulatory Visit: Payer: Self-pay | Admitting: Internal Medicine

## 2018-04-22 DIAGNOSIS — E119 Type 2 diabetes mellitus without complications: Secondary | ICD-10-CM

## 2018-05-02 ENCOUNTER — Telehealth: Payer: Self-pay

## 2018-05-02 NOTE — Telephone Encounter (Signed)
Patient called concern about her prep for her colonoscopy. She received papers instructing her to stop taking her metformin medication prior to her procedure. Patient was unsure of stopping this medication and wanted to get a verbal response from Dr. Judithann GravesBerglund regarding her medication. She also takes Losartan and wants to know does she take prior to procedure. I advise patient she needs to follow instructions via her GI doctor gave her. Please advise. Patient would like response today since she has a procedure 05/08/2018

## 2018-05-02 NOTE — Telephone Encounter (Signed)
lmom 

## 2018-05-02 NOTE — Telephone Encounter (Signed)
Patient needs to follow the prep instructions as provided, otherwise the procedure may not be complete.

## 2018-05-13 ENCOUNTER — Encounter: Payer: Self-pay | Admitting: Internal Medicine

## 2018-06-16 ENCOUNTER — Other Ambulatory Visit: Payer: Self-pay | Admitting: Internal Medicine

## 2018-06-16 DIAGNOSIS — E119 Type 2 diabetes mellitus without complications: Secondary | ICD-10-CM

## 2018-07-07 ENCOUNTER — Encounter: Payer: Self-pay | Admitting: Internal Medicine

## 2018-07-07 ENCOUNTER — Ambulatory Visit: Payer: BC Managed Care – PPO | Admitting: Internal Medicine

## 2018-07-07 VITALS — BP 108/68 | HR 60 | Ht 63.0 in | Wt 197.0 lb

## 2018-07-07 DIAGNOSIS — E119 Type 2 diabetes mellitus without complications: Secondary | ICD-10-CM

## 2018-07-07 DIAGNOSIS — I10 Essential (primary) hypertension: Secondary | ICD-10-CM

## 2018-07-07 NOTE — Progress Notes (Signed)
Date:  07/07/2018   Name:  Laura Adkins   DOB:  10-Jan-1956   MRN:  [id number]   Chief Complaint: Diabetes Diabetes  She presents for her follow-up diabetic visit. She has type 2 diabetes mellitus. Her disease course has been stable. Pertinent negatives for hypoglycemia include no headaches or tremors. Pertinent negatives for diabetes include no chest pain, no fatigue, no polydipsia and no polyuria. Current diabetic treatment includes oral agent (monotherapy). She is compliant with treatment all of the time. Her weight is decreasing steadily. She is following a generally healthy diet. She participates in exercise daily. She monitors blood glucose at home 1-2 x per day. Blood glucose monitoring compliance is excellent. Her breakfast blood glucose is taken between 6-7 am. Her breakfast blood glucose range is generally 90-110 mg/dl. An ACE inhibitor/angiotensin II receptor blocker is being taken. Eye exam is current.  Hypertension  This is a chronic problem. The problem is controlled. Pertinent negatives include no chest pain, headaches, palpitations or shortness of breath. Past treatments include angiotensin blockers. The current treatment provides significant improvement.   Lab Results  Component Value Date   HGBA1C 7.1 (H) 03/07/2018   Lab Results  Component Value Date   CREATININE 1.05 (H) 03/07/2018   BUN 15 03/07/2018   NA 143 03/07/2018   K 4.2 03/07/2018   CL 101 03/07/2018   CO2 26 03/07/2018   Lab Results  Component Value Date   CHOL 136 03/07/2018   HDL 65 03/07/2018   LDLCALC 56 03/07/2018   TRIG 75 03/07/2018   CHOLHDL 2.1 03/07/2018      Review of Systems  Constitutional: Negative for appetite change, fatigue, fever and unexpected weight change.  HENT: Negative for tinnitus and trouble swallowing.   Eyes: Negative for visual disturbance.  Respiratory: Negative for cough, chest tightness and shortness of breath.   Cardiovascular: Negative for chest pain,  palpitations and leg swelling.  Gastrointestinal: Negative for abdominal pain.  Endocrine: Negative for polydipsia and polyuria.  Genitourinary: Negative for dysuria and hematuria.  Musculoskeletal: Negative for arthralgias.  Neurological: Negative for tremors, numbness and headaches.  Psychiatric/Behavioral: Negative for dysphoric mood.    Patient Active Problem List   Diagnosis Date Noted  . Hyperlipidemia associated with type 2 diabetes mellitus ([place]) 11/06/2017  . Low grade squamous intraepithelial lesion (LGSIL) on Papanicolaou smear of cervix 02/06/2016  . Controlled type 2 diabetes mellitus without complication, without long-term current use of insulin ([place]) 10/10/2015  . Coronary artery disease of native artery of native heart with stable angina pectoris ([place]) 10/10/2015  . Fibrocystic breast 05/05/2015  . Essential (primary) hypertension 05/05/2015  . Migraine without aura and responsive to treatment 05/05/2015  . Obstructive apnea 05/05/2015  . Arthritis of knee, degenerative 05/05/2015  . Posterior tibial tendinitis 05/05/2015    Allergies  Allergen Reactions  . Ace Inhibitors Other (See Comments)  . Orange Oil Other (See Comments)    Unable to tolerate it.   . Celebrex  [Celecoxib] Rash    Past Surgical History:  Procedure Laterality Date  . BREAST SURGERY  05/1997   necrosis  . COLONOSCOPY  04/2008  . COLONOSCOPY  04/2018   normal - repeat 10 yrs    Social History   Tobacco Use  . Smoking status: Never Smoker  . Smokeless tobacco: Never Used  Substance Use Topics  . Alcohol use: No    Alcohol/week: 0.0 standard drinks  . Drug use: No     Medication list has  been reviewed and updated.  Current Meds  Medication Sig  . aspirin 81 MG chewable tablet Chew 1 tablet by mouth daily.  Marland Kitchen atorvastatin (LIPITOR) 80 MG tablet Take 80 mg by mouth daily.  . Blood Glucose Monitoring Suppl (ONE TOUCH ULTRA SYSTEM KIT) w/Device KIT 1 kit by Does not apply route  once.  . Calcium Carbonate-Vit D-Min (CALTRATE 600+D PLUS MINERALS) 600-800 MG-UNIT TABS Take 1 tablet by mouth daily.  Marland Kitchen glucose blood (ONE TOUCH ULTRA TEST) test strip 100 each by Other route 2 (two) times daily. Use to test blood sugar 2 times daily.  Marland Kitchen KLOR-CON M20 20 MEQ tablet Take 1 tablet by mouth daily.  Marland Kitchen losartan (COZAAR) 100 MG tablet Take 1 tablet by mouth daily.  . metFORMIN (GLUCOPHAGE) 500 MG tablet TAKE 2 TABLETS (1,000 MG TOTAL) BY MOUTH DAILY.  . Multiple Vitamins tablet Take by mouth.  Gl[facility] DELICA LANCETS 62U MISC USE ONCE DAILY AS DIRECTED  . torsemide (DEMADEX) 20 MG tablet Take 1 tablet by mouth daily.  Marland Kitchen triamcinolone cream (KENALOG) 0.1 % Apply 1 application topically 2 (two) times daily.    PHQ 2/9 Scores 07/07/2018 06/24/2017 06/07/2016  PHQ - 2 Score 0 0 0   Wt Readings from Last 3 Encounters:  07/07/18 197 lb (89.4 kg)  03/07/18 208 lb (94.3 kg)  11/06/17 210 lb (95.3 kg)    Physical Exam  Constitutional: She is oriented to person, place, and time. She appears well-developed. No distress.  HENT:  Head: Normocephalic and atraumatic.  Neck: Normal range of motion. Neck supple.  Cardiovascular: Normal rate, regular rhythm and normal heart sounds.  Pulmonary/Chest: Effort normal and breath sounds normal. No respiratory distress.  Musculoskeletal: Normal range of motion.  Lymphadenopathy:    She has no cervical adenopathy.  Neurological: She is alert and oriented to person, place, and time.  Skin: Skin is warm and dry. No rash noted. No erythema.  Psychiatric: She has a normal mood and affect. Her behavior is normal. Thought content normal.  Nursing note and vitals reviewed.   BP 108/68 (BP Location: Right Arm, Patient Position: Sitting, Cuff Size: Normal)   Pulse 60   Ht 5' 3"  (1.6 m)   Wt 197 lb (89.4 kg)   SpO2 100%   BMI 34.90 kg/m   Assessment and Plan: 1. Controlled type 2 diabetes mellitus without complication, without long-term current  use of insulin (HCC) Excellent compliance with diet and exercise Eye exam report requested - Hemoglobin A1c  2. Essential (primary) hypertension controlled   No orders of the defined types were placed in this encounter.   Partially dictated using Editor, commissioning. Any errors are unintentional.  Halina Maidens, MD [facility]  07/07/2018

## 2018-07-08 LAB — HEMOGLOBIN A1C
Est. average glucose Bld gHb Est-mCnc: 131 mg/dL
Hgb A1c MFr Bld: 6.2 % — ABNORMAL HIGH (ref 4.8–5.6)

## 2018-07-30 ENCOUNTER — Encounter: Payer: Self-pay | Admitting: Internal Medicine

## 2018-08-01 ENCOUNTER — Other Ambulatory Visit: Payer: Self-pay | Admitting: Internal Medicine

## 2018-09-09 ENCOUNTER — Ambulatory Visit (INDEPENDENT_AMBULATORY_CARE_PROVIDER_SITE_OTHER): Payer: BC Managed Care – PPO

## 2018-09-09 DIAGNOSIS — Z23 Encounter for immunization: Secondary | ICD-10-CM

## 2018-11-07 ENCOUNTER — Ambulatory Visit: Payer: BC Managed Care – PPO | Admitting: Internal Medicine

## 2018-11-07 ENCOUNTER — Encounter: Payer: Self-pay | Admitting: Internal Medicine

## 2018-11-07 VITALS — BP 118/68 | HR 57 | Ht 63.0 in | Wt 202.0 lb

## 2018-11-07 DIAGNOSIS — Z1231 Encounter for screening mammogram for malignant neoplasm of breast: Secondary | ICD-10-CM | POA: Diagnosis not present

## 2018-11-07 DIAGNOSIS — E119 Type 2 diabetes mellitus without complications: Secondary | ICD-10-CM | POA: Diagnosis not present

## 2018-11-07 DIAGNOSIS — I10 Essential (primary) hypertension: Secondary | ICD-10-CM

## 2018-11-07 NOTE — Patient Instructions (Signed)
For cold symptoms:    Sugar free cough drops or sugar free Robitussin    Coricidin HBP    Flonase nasal spray

## 2018-11-07 NOTE — Progress Notes (Signed)
  Date:  11/07/2018   Name:  Laura Adkins   DOB:  12/12/1955   MRN:  [id number]   Chief Complaint: Diabetes (Follow up. ) and Hypertension  Diabetes  She presents for her follow-up diabetic visit. She has type 2 diabetes mellitus. Her disease course has been stable. Pertinent negatives for hypoglycemia include no headaches or tremors. Pertinent negatives for diabetes include no chest pain, no fatigue, no polydipsia and no polyuria. She monitors blood glucose at home 3-4 x per week. Her breakfast blood glucose is taken between 6-7 am. Her breakfast blood glucose range is generally 110-130 mg/dl. An ACE inhibitor/angiotensin II receptor blocker is being taken.  Hypertension  This is a chronic problem. The problem is controlled. Pertinent negatives include no chest pain, headaches, palpitations or shortness of breath.   Lab Results  Component Value Date   HGBA1C 6.2 (H) 07/07/2018   Lab Results  Component Value Date   CREATININE 1.05 (H) 03/07/2018   BUN 15 03/07/2018   NA 143 03/07/2018   K 4.2 03/07/2018   CL 101 03/07/2018   CO2 26 03/07/2018     Review of Systems  Constitutional: Negative for appetite change, fatigue, fever and unexpected weight change.  HENT: Negative for tinnitus and trouble swallowing.   Eyes: Negative for visual disturbance.  Respiratory: Negative for cough, chest tightness and shortness of breath.   Cardiovascular: Negative for chest pain, palpitations and leg swelling.  Gastrointestinal: Negative for abdominal pain.  Endocrine: Negative for polydipsia and polyuria.  Genitourinary: Negative for dysuria and hematuria.  Musculoskeletal: Negative for arthralgias.  Neurological: Negative for tremors, numbness and headaches.  Psychiatric/Behavioral: Negative for dysphoric mood.    Patient Active Problem List   Diagnosis Date Noted  . Hyperlipidemia associated with type 2 diabetes mellitus ([facility]CC) 11/06/2017  . Low grade squamous intraepithelial  lesion (LGSIL) on Papanicolaou smear of cervix 02/06/2016  . Controlled type 2 diabetes mellitus without complication, without long-term current use of insulin (HCC) 10/10/2015  . Coronary artery disease of native artery of native heart with stable angina pectoris (HCC) 10/10/2015  . Fibrocystic breast 05/05/2015  . Essential (primary) hypertension 05/05/2015  . Migraine without aura and responsive to treatment 05/05/2015  . Obstructive apnea 05/05/2015  . Arthritis of knee, degenerative 05/05/2015  . Posterior tibial tendinitis 05/05/2015    Allergies  Allergen Reactions  . Ace Inhibitors Other (See Comments)  . Orange Oil Other (See Comments)    Unable to tolerate it.   . Celebrex  [Celecoxib] Rash    Past Surgical History:  Procedure Laterality Date  . BREAST SURGERY  05/1997   necrosis  . COLONOSCOPY  04/2008  . COLONOSCOPY  04/2018   normal - repeat 10 yrs    Social History   Tobacco Use  . Smoking status: Never Smoker  . Smokeless tobacco: Never Used  Substance Use Topics  . Alcohol use: No    Alcohol/week: 0.0 standard drinks  . Drug use: No     Medication list has been reviewed and updated.  Current Meds  Medication Sig  . aspirin 81 MG chewable tablet Chew 1 tablet by mouth daily.  . atorvastatin (LIPITOR) 80 MG tablet Take 80 mg by mouth daily.  . Blood Glucose Monitoring Suppl (ONE TOUCH ULTRA SYSTEM KIT) w/Device KIT 1 kit by Does not apply route once.  . Calcium Carbonate-Vit D-Min (CALTRATE 600+D PLUS MINERALS) 600-800 MG-UNIT TABS Take 1 tablet by mouth daily.  . KLOR-CON M20 20 MEQ tablet   Take 1 tablet by mouth daily.  Marland Kitchen losartan (COZAAR) 100 MG tablet Take 1 tablet by mouth daily.  . metFORMIN (GLUCOPHAGE) 500 MG tablet TAKE 2 TABLETS (1,000 MG TOTAL) BY MOUTH DAILY.  . Multiple Vitamins tablet Take by mouth.  . ONE TOUCH ULTRA TEST test strip USE TO TEST BLOOD SUGAR 2 TIMES DAILY.  Marland Kitchen ONETOUCH DELICA LANCETS 61P MISC USE ONCE DAILY AS DIRECTED     PHQ 2/9 Scores 11/07/2018 07/07/2018 06/24/2017 06/07/2016  PHQ - 2 Score 0 0 0 0    Physical Exam Vitals signs and nursing note reviewed.  Constitutional:      General: She is not in acute distress.    Appearance: She is well-developed.  HENT:     Head: Normocephalic and atraumatic.  Eyes:     Extraocular Movements: Extraocular movements intact.     Pupils: Pupils are equal, round, and reactive to light.  Neck:     Musculoskeletal: Normal range of motion and neck supple.     Vascular: No carotid bruit.  Cardiovascular:     Rate and Rhythm: Normal rate and regular rhythm.  Pulmonary:     Effort: Pulmonary effort is normal. No respiratory distress.     Breath sounds: Normal breath sounds. No wheezing.  Musculoskeletal: Normal range of motion.  Skin:    General: Skin is warm and dry.     Findings: No rash.  Neurological:     Mental Status: She is alert and oriented to person, place, and time.  Psychiatric:        Behavior: Behavior normal.        Thought Content: Thought content normal.     BP 118/68 (BP Location: Right Arm, Patient Position: Sitting, Cuff Size: Normal)   Pulse (!) 57   Ht 5' 3" (1.6 m)   Wt 202 lb (91.6 kg)   SpO2 100%   BMI 35.78 kg/m   Assessment and Plan: 1. Controlled type 2 diabetes mellitus without complication, without long-term current use of insulin (HCC) Continue current regimen, resume exercise - Basic metabolic panel - Hemoglobin A1c  2. Essential (primary) hypertension Controlled   3. Encounter for screening mammogram for breast cancer Scheduled at [facility] - MM 3D SCREEN BREAST BILATERAL   Partially dictated using Editor, commissioning. Any errors are unintentional.  Halina Maidens, MD [facility]  11/07/2018

## 2018-11-08 LAB — BASIC METABOLIC PANEL
BUN / CREAT RATIO: 15 (ref 12–28)
BUN: 14 mg/dL (ref 8–27)
CO2: 26 mmol/L (ref 20–29)
CREATININE: 0.91 mg/dL (ref 0.57–1.00)
Calcium: 9.8 mg/dL (ref 8.7–10.3)
Chloride: 98 mmol/L (ref 96–106)
GFR calc non Af Amer: 68 mL/min/{1.73_m2} (ref >59)
GFR, EST AFRICAN AMERICAN: 79 mL/min/{1.73_m2} (ref >59)
Glucose: 92 mg/dL (ref 65–99)
Potassium: 4.4 mmol/L (ref 3.5–5.2)
Sodium: 138 mmol/L (ref 134–144)

## 2018-11-08 LAB — HEMOGLOBIN A1C
Est. average glucose Bld gHb Est-mCnc: 134 mg/dL
Hgb A1c MFr Bld: 6.3 % — ABNORMAL HIGH (ref 4.8–5.6)

## 2019-01-19 LAB — HM DIABETES EYE EXAM

## 2019-02-28 ENCOUNTER — Other Ambulatory Visit: Payer: Self-pay | Admitting: Internal Medicine

## 2019-03-11 ENCOUNTER — Encounter: Payer: Self-pay | Admitting: Internal Medicine

## 2019-03-11 ENCOUNTER — Other Ambulatory Visit: Payer: Self-pay

## 2019-03-11 ENCOUNTER — Ambulatory Visit (INDEPENDENT_AMBULATORY_CARE_PROVIDER_SITE_OTHER): Payer: BC Managed Care – PPO | Admitting: Internal Medicine

## 2019-03-11 VITALS — BP 126/82 | HR 81 | Temp 97.8°F | Resp 16 | Ht 63.0 in | Wt 204.0 lb

## 2019-03-11 DIAGNOSIS — E785 Hyperlipidemia, unspecified: Secondary | ICD-10-CM

## 2019-03-11 DIAGNOSIS — I10 Essential (primary) hypertension: Secondary | ICD-10-CM | POA: Diagnosis not present

## 2019-03-11 DIAGNOSIS — E1169 Type 2 diabetes mellitus with other specified complication: Secondary | ICD-10-CM

## 2019-03-11 DIAGNOSIS — I25118 Atherosclerotic heart disease of native coronary artery with other forms of angina pectoris: Secondary | ICD-10-CM

## 2019-03-11 DIAGNOSIS — Z1231 Encounter for screening mammogram for malignant neoplasm of breast: Secondary | ICD-10-CM | POA: Diagnosis not present

## 2019-03-11 DIAGNOSIS — E118 Type 2 diabetes mellitus with unspecified complications: Secondary | ICD-10-CM

## 2019-03-11 DIAGNOSIS — Z0000 Encounter for general adult medical examination without abnormal findings: Secondary | ICD-10-CM | POA: Diagnosis not present

## 2019-03-11 LAB — POCT URINALYSIS DIPSTICK
Bilirubin, UA: NEGATIVE
Blood, UA: NEGATIVE
Glucose, UA: NEGATIVE
Ketones, UA: NEGATIVE
Leukocytes, UA: NEGATIVE
Nitrite, UA: NEGATIVE
Protein, UA: NEGATIVE
Spec Grav, UA: 1.01 (ref 1.010–1.025)
Urobilinogen, UA: 0.2 E.U./dL
pH, UA: 7.5 (ref 5.0–8.0)

## 2019-03-11 MED ORDER — METFORMIN HCL 500 MG PO TABS: 1000.0000 mg | ORAL_TABLET | Freq: Every day | ORAL | 3 refills | Status: DC

## 2019-03-11 MED ORDER — LOSARTAN POTASSIUM 100 MG PO TABS: 100.0000 mg | ORAL_TABLET | Freq: Every day | ORAL | 3 refills | Status: DC

## 2019-03-11 MED ORDER — ONETOUCH DELICA LANCETS 33G MISC: 1.0000 | Freq: Every day | 3 refills | Status: DC

## 2019-03-11 MED ORDER — ATORVASTATIN CALCIUM 80 MG PO TABS: 80.0000 mg | ORAL_TABLET | Freq: Every day | ORAL | 3 refills | Status: DC

## 2019-03-11 NOTE — Progress Notes (Signed)
Date:  03/11/2019   Name:  Laura Adkins   DOB:  Oct 05, 1956   MRN:  [id number]   Chief Complaint: Annual Exam (Breast Exam. Pap next year.) and Diabetes (100-119) Laura Adkins is a 63 y.o. female who presents today for her Complete Annual Exam. She feels well. She reports exercising walking. She reports she is sleeping fairly well. She is not interested in Shingrix at this time.  Mammogram 11/2018 Pap smear 01/2017 Colonoscopy  04/2018  Diabetes  She presents for her follow-up diabetic visit. She has type 2 diabetes mellitus. Pertinent negatives for hypoglycemia include no dizziness, headaches, nervousness/anxiousness or tremors. Pertinent negatives for diabetes include no chest pain, no fatigue, no polydipsia and no polyuria. Current diabetic treatment includes oral agent (monotherapy). She is compliant with treatment none of the time. She monitors blood glucose at home 1-2 x per day. Her breakfast blood glucose is taken between 6-7 am. Her breakfast blood glucose range is generally 130-140 mg/dl. An ACE inhibitor/angiotensin II receptor blocker is being taken. She sees a podiatrist.Eye exam is current.  Hypertension  This is a chronic problem. The problem is controlled. Pertinent negatives include no chest pain, headaches, palpitations or shortness of breath. Past treatments include angiotensin blockers.  Hyperlipidemia  The problem is controlled. Pertinent negatives include no chest pain or shortness of breath. Current antihyperlipidemic treatment includes statins. The current treatment provides significant improvement of lipids. There are no compliance problems.   CAD - followed by cardiology for non obstructive CAD, treated with metoprolol.  She denies chest pain, SOB, dizziness.  Lab Results  Component Value Date   HGBA1C 6.3 (H) 11/07/2018   Lab Results  Component Value Date   CREATININE 0.91 11/07/2018   BUN 14 11/07/2018   NA 138 11/07/2018   K 4.4 11/07/2018    CL 98 11/07/2018   CO2 26 11/07/2018   Lab Results  Component Value Date   CHOL 136 03/07/2018   HDL 65 03/07/2018   LDLCALC 56 03/07/2018   TRIG 75 03/07/2018   CHOLHDL 2.1 03/07/2018     Review of Systems  Constitutional: Negative for chills, fatigue and fever.  HENT: Negative for congestion, hearing loss, tinnitus, trouble swallowing and voice change.   Eyes: Negative for visual disturbance.  Respiratory: Negative for cough, chest tightness, shortness of breath and wheezing.   Cardiovascular: Negative for chest pain, palpitations and leg swelling.  Gastrointestinal: Negative for abdominal pain, constipation, diarrhea and vomiting.  Endocrine: Negative for polydipsia and polyuria.  Genitourinary: Negative for dysuria, frequency, genital sores, vaginal bleeding and vaginal discharge.  Musculoskeletal: Negative for arthralgias, gait problem and joint swelling.  Skin: Negative for color change and rash.  Allergic/Immunologic: Negative for environmental allergies.  Neurological: Negative for dizziness, tremors, light-headedness and headaches.  Hematological: Negative for adenopathy. Does not bruise/bleed easily.  Psychiatric/Behavioral: Negative for dysphoric mood and sleep disturbance. The patient is not nervous/anxious.     Patient Active Problem List   Diagnosis Date Noted  . Hyperlipidemia associated with type 2 diabetes mellitus ([place]) 11/06/2017  . Low grade squamous intraepithelial lesion (LGSIL) on Papanicolaou smear of cervix 02/06/2016  . Controlled type 2 diabetes mellitus without complication, without long-term current use of insulin ([place]) 10/10/2015  . Coronary artery disease of native artery of native heart with stable angina pectoris ([place]) 10/10/2015  . Fibrocystic breast 05/05/2015  . Essential (primary) hypertension 05/05/2015  . Migraine without aura and responsive to treatment 05/05/2015  . Obstructive apnea 05/05/2015  .  Arthritis of knee, degenerative  05/05/2015  . Posterior tibial tendinitis 05/05/2015    Allergies  Allergen Reactions  . Ace Inhibitors Other (See Comments)  . Orange Oil Other (See Comments)    Unable to tolerate it.   . Celebrex  [Celecoxib] Rash    Past Surgical History:  Procedure Laterality Date  . BREAST SURGERY  05/1997   necrosis  . COLONOSCOPY  04/2008  . COLONOSCOPY  04/2018   normal - repeat 10 yrs    Social History   Tobacco Use  . Smoking status: Never Smoker  . Smokeless tobacco: Never Used  Substance Use Topics  . Alcohol use: No    Alcohol/week: 0.0 standard drinks  . Drug use: No     Medication list has been reviewed and updated.  Current Meds  Medication Sig  . aspirin 81 MG chewable tablet Chew 1 tablet by mouth daily.  Marland Kitchen atorvastatin (LIPITOR) 80 MG tablet Take 80 mg by mouth daily.  . Blood Glucose Monitoring Suppl (ONE TOUCH ULTRA SYSTEM KIT) w/Device KIT 1 kit by Does not apply route once.  . Calcium Carbonate-Vit D-Min (CALTRATE 600+D PLUS MINERALS) 600-800 MG-UNIT TABS Take 1 tablet by mouth daily.  Marland Kitchen KLOR-CON M20 20 MEQ tablet Take 1 tablet by mouth daily.  Marland Kitchen losartan (COZAAR) 100 MG tablet Take 1 tablet by mouth daily.  . metFORMIN (GLUCOPHAGE) 500 MG tablet TAKE 2 TABLETS (1,000 MG TOTAL) BY MOUTH DAILY.  . Multiple Vitamins tablet Take by mouth.  . ONE TOUCH ULTRA TEST test strip USE TO TEST BLOOD SUGAR 2 TIMES DAILY.  Marland Kitchen ONETOUCH DELICA LANCETS 69S MISC USE ONCE DAILY AS DIRECTED  . torsemide (DEMADEX) 20 MG tablet Take 1 tablet by mouth daily.  Marland Kitchen triamcinolone cream (KENALOG) 0.1 % Apply 1 application topically 2 (two) times daily.    PHQ 2/9 Scores 03/11/2019 11/07/2018 07/07/2018 06/24/2017  PHQ - 2 Score 0 0 0 0  PHQ- 9 Score 0 - - -    BP Readings from Last 3 Encounters:  03/11/19 126/82  11/07/18 118/68  07/07/18 108/68    Physical Exam Vitals signs and nursing note reviewed.  Constitutional:      General: She is not in acute distress.    Appearance:  She is well-developed.  HENT:     Head: Normocephalic and atraumatic.     Right Ear: Tympanic membrane and ear canal normal.     Left Ear: Tympanic membrane and ear canal normal.     Nose:     Right Sinus: No maxillary sinus tenderness.     Left Sinus: No maxillary sinus tenderness.     Mouth/Throat:     Pharynx: Uvula midline.  Eyes:     General: No scleral icterus.       Right eye: No discharge.        Left eye: No discharge.     Conjunctiva/sclera: Conjunctivae normal.  Neck:     Musculoskeletal: Normal range of motion. No erythema.     Thyroid: No thyromegaly.     Vascular: No carotid bruit.  Cardiovascular:     Rate and Rhythm: Normal rate and regular rhythm.     Pulses: Normal pulses.     Heart sounds: Normal heart sounds.  Pulmonary:     Effort: Pulmonary effort is normal. No respiratory distress.     Breath sounds: No wheezing.  Chest:     Breasts:        Right: No mass, nipple discharge, skin  change or tenderness.        Left: No mass, nipple discharge, skin change or tenderness.  Abdominal:     General: Bowel sounds are normal.     Palpations: Abdomen is soft.     Tenderness: There is no abdominal tenderness.  Musculoskeletal: Normal range of motion.  Lymphadenopathy:     Cervical: No cervical adenopathy.  Skin:    General: Skin is warm and dry.     Findings: No rash.  Neurological:     Mental Status: She is alert and oriented to person, place, and time.     Cranial Nerves: No cranial nerve deficit.     Sensory: No sensory deficit.     Deep Tendon Reflexes: Reflexes are normal and symmetric.  Psychiatric:        Speech: Speech normal.        Behavior: Behavior normal.        Thought Content: Thought content normal.   Foot exam - normal skin, nails, pulses and sensation bilaterally; toe nails curved under dramatically   Wt Readings from Last 3 Encounters:  03/11/19 204 lb (92.5 kg)  11/07/18 202 lb (91.6 kg)  07/07/18 197 lb (89.4 kg)    BP 126/82    Pulse 81   Temp 97.8 F (36.6 C)   Resp 16   Ht 5' 3"  (1.6 m)   Wt 204 lb (92.5 kg)   SpO2 99%   BMI 36.14 kg/m   Assessment and Plan: 1. Annual physical exam Normal exam except for weight Continue exercise, work on diet - POCT urinalysis dipstick  2. Encounter for screening mammogram for breast cancer Recently done - normal  3. Hyperlipidemia associated with type 2 diabetes mellitus ([place]) On statin therapy - atorvastatin (LIPITOR) 80 MG tablet; Take 1 tablet (80 mg total) by mouth daily.  Dispense: 90 tablet; Refill: 3 - Lipid panel  4. Essential (primary) hypertension Controlled, continue current Rx - losartan (COZAAR) 100 MG tablet; Take 1 tablet (100 mg total) by mouth daily.  Dispense: 90 tablet; Refill: 3 - CBC with Differential/Platelet - Comprehensive metabolic panel - TSH  5. Type II diabetes mellitus with complication [facility]) May reduce FSBS to daily, continue current medications See Podiatry as planned next month - OneTouch Delica Lancets 76F MISC; Apply 1 each topically daily. as directed  Dispense: 100 each; Refill: 3 - metFORMIN (GLUCOPHAGE) 500 MG tablet; Take 2 tablets (1,000 mg total) by mouth daily.  Dispense: 180 tablet; Refill: 3 - Hemoglobin A1c  6. Coronary artery disease of native artery of native heart with stable angina pectoris ([place]) Stable without current angina Continue metoprolol   Partially dictated using Editor, commissioning. Any errors are unintentional.  Halina Maidens, MD [facility]  03/11/2019

## 2019-03-12 LAB — CBC WITH DIFFERENTIAL/PLATELET
Basophils Absolute: 0.1 10*3/uL (ref 0.0–0.2)
Basos: 1 %
EOS (ABSOLUTE): 0.1 10*3/uL (ref 0.0–0.4)
Eos: 3 %
Hematocrit: 34.7 % (ref 34.0–46.6)
Hemoglobin: 11.3 g/dL (ref 11.1–15.9)
Immature Grans (Abs): 0 10*3/uL (ref 0.0–0.1)
Immature Granulocytes: 0 %
Lymphocytes Absolute: 2.3 10*3/uL (ref 0.7–3.1)
Lymphs: 50 %
MCH: 26 pg — ABNORMAL LOW (ref 26.6–33.0)
MCHC: 32.6 g/dL (ref 31.5–35.7)
MCV: 80 fL (ref 79–97)
Monocytes Absolute: 0.4 10*3/uL (ref 0.1–0.9)
Monocytes: 8 %
Neutrophils Absolute: 1.8 10*3/uL (ref 1.4–7.0)
Neutrophils: 38 %
Platelets: 244 10*3/uL (ref 150–450)
RBC: 4.35 x10E6/uL (ref 3.77–5.28)
RDW: 13.6 % (ref 11.7–15.4)
WBC: 4.6 10*3/uL (ref 3.4–10.8)

## 2019-03-12 LAB — COMPREHENSIVE METABOLIC PANEL
ALT: 22 IU/L (ref 0–32)
AST: 19 IU/L (ref 0–40)
Albumin/Globulin Ratio: 1.6 (ref 1.2–2.2)
Albumin: 4.5 g/dL (ref 3.8–4.8)
Alkaline Phosphatase: 99 IU/L (ref 39–117)
BUN/Creatinine Ratio: 13 (ref 12–28)
BUN: 12 mg/dL (ref 8–27)
Bilirubin Total: 0.3 mg/dL (ref 0.0–1.2)
CO2: 26 mmol/L (ref 20–29)
Calcium: 10.1 mg/dL (ref 8.7–10.3)
Chloride: 101 mmol/L (ref 96–106)
Creatinine, Ser: 0.94 mg/dL (ref 0.57–1.00)
GFR calc Af Amer: 76 mL/min/{1.73_m2} (ref >59)
GFR calc non Af Amer: 66 mL/min/{1.73_m2} (ref >59)
Globulin, Total: 2.9 g/dL (ref 1.5–4.5)
Glucose: 71 mg/dL (ref 65–99)
Potassium: 4.1 mmol/L (ref 3.5–5.2)
Sodium: 141 mmol/L (ref 134–144)
Total Protein: 7.4 g/dL (ref 6.0–8.5)

## 2019-03-12 LAB — LIPID PANEL
Chol/HDL Ratio: 2.3 ratio (ref 0.0–4.4)
Cholesterol, Total: 143 mg/dL (ref 100–199)
HDL: 62 mg/dL (ref >39)
LDL Calculated: 60 mg/dL (ref 0–99)
Triglycerides: 105 mg/dL (ref 0–149)
VLDL Cholesterol Cal: 21 mg/dL (ref 5–40)

## 2019-03-12 LAB — HEMOGLOBIN A1C
Est. average glucose Bld gHb Est-mCnc: 137 mg/dL
Hgb A1c MFr Bld: 6.4 % — ABNORMAL HIGH (ref 4.8–5.6)

## 2019-03-12 LAB — TSH: TSH: 2.63 u[IU]/mL (ref 0.450–4.500)

## 2019-04-07 ENCOUNTER — Other Ambulatory Visit: Payer: Self-pay | Admitting: Internal Medicine

## 2019-04-07 DIAGNOSIS — L259 Unspecified contact dermatitis, unspecified cause: Secondary | ICD-10-CM

## 2019-05-01 ENCOUNTER — Ambulatory Visit: Payer: BC Managed Care – PPO | Admitting: Internal Medicine

## 2019-05-01 ENCOUNTER — Other Ambulatory Visit: Payer: Self-pay

## 2019-05-01 ENCOUNTER — Encounter: Payer: Self-pay | Admitting: Internal Medicine

## 2019-05-01 VITALS — BP 100/62 | HR 62 | Temp 97.9°F | Resp 12 | Ht 63.0 in | Wt 203.6 lb

## 2019-05-01 DIAGNOSIS — R600 Localized edema: Secondary | ICD-10-CM | POA: Diagnosis not present

## 2019-05-01 DIAGNOSIS — M25571 Pain in right ankle and joints of right foot: Secondary | ICD-10-CM

## 2019-05-01 DIAGNOSIS — M79671 Pain in right foot: Secondary | ICD-10-CM | POA: Diagnosis not present

## 2019-05-01 NOTE — Patient Instructions (Addendum)
Wear compression stocking during the day.  Take Tylenol.  Use ice massage.  Elevate when possible.   Plantar Fasciitis  Plantar fasciitis is a painful foot condition that affects the heel. It occurs when the band of tissue that connects the toes to the heel bone (plantar fascia) becomes irritated. This can happen as the result of exercising too much or doing other repetitive activities (overuse injury). The pain from plantar fasciitis can range from mild irritation to severe pain that makes it difficult to walk or move. The pain is usually worse in the morning after sleeping, or after sitting or lying down for a while. Pain may also be worse after long periods of walking or standing. What are the causes? This condition may be caused by:  Standing for long periods of time.  Wearing shoes that do not have good arch support.  Doing activities that put stress on joints (high-impact activities), including running, aerobics, and ballet.  Being overweight.  An abnormal way of walking (gait).  Tight muscles in the back of your lower leg (calf).  High arches in your feet.  Starting a new athletic activity. What are the signs or symptoms? The main symptom of this condition is heel pain. Pain may:  Be worse with first steps after a time of rest, especially in the morning after sleeping or after you have been sitting or lying down for a while.  Be worse after long periods of standing still.  Decrease after 30-45 minutes of activity, such as gentle walking. How is this diagnosed? This condition may be diagnosed based on your medical history and your symptoms. Your health care provider may ask questions about your activity level. Your health care provider will do a physical exam to check for:  A tender area on the bottom of your foot.  A high arch in your foot.  Pain when you move your foot.  Difficulty moving your foot. You may have imaging tests to confirm the diagnosis, such  as:  X-rays.  Ultrasound.  MRI. How is this treated? Treatment for plantar fasciitis depends on how severe your condition is. Treatment may include:  Rest, ice, applying pressure (compression), and raising the affected foot (elevation). This may be called RICE therapy. Your health care provider may recommend RICE therapy along with over-the-counter pain medicines to manage your pain.  Exercises to stretch your calves and your plantar fascia.  A splint that holds your foot in a stretched, upward position while you sleep (night splint).  Physical therapy to relieve symptoms and prevent problems in the future.  Injections of steroid medicine (cortisone) to relieve pain and inflammation.  Stimulating your plantar fascia with electrical impulses (extracorporeal shock wave therapy). This is usually the last treatment option before surgery.  Surgery, if other treatments have not worked after 12 months. Follow these instructions at home:  Managing pain, stiffness, and swelling  If directed, put ice on the painful area: ? Put ice in a plastic bag, or use a frozen bottle of water. ? Place a towel between your skin and the bag or bottle. ? Roll the bottom of your foot over the bag or bottle. ? Do this for 20 minutes, 2-3 times a day.  Wear athletic shoes that have air-sole or gel-sole cushions, or try wearing soft shoe inserts that are designed for plantar fasciitis.  Raise (elevate) your foot above the level of your heart while you are sitting or lying down. Activity  Avoid activities that cause pain. Ask  your health care provider what activities are safe for you.  Do physical therapy exercises and stretches as told by your health care provider.  Try activities and forms of exercise that are easier on your joints (low-impact). Examples include swimming, water aerobics, and biking. General instructions  Take over-the-counter and prescription medicines only as told by your health  care provider.  Wear a night splint while sleeping, if told by your health care provider. Loosen the splint if your toes tingle, become numb, or turn cold and blue.  Maintain a healthy weight, or work with your health care provider to lose weight as needed.  Keep all follow-up visits as told by your health care provider. This is important. Contact a health care provider if you:  Have symptoms that do not go away after caring for yourself at home.  Have pain that gets worse.  Have pain that affects your ability to move or do your daily activities. Summary  Plantar fasciitis is a painful foot condition that affects the heel. It occurs when the band of tissue that connects the toes to the heel bone (plantar fascia) becomes irritated.  The main symptom of this condition is heel pain that may be worse after exercising too much or standing still for a long time.  Treatment varies, but it usually starts with rest, ice, compression, and elevation (RICE therapy) and over-the-counter medicines to manage pain. This information is not intended to replace advice given to you by your health care provider. Make sure you discuss any questions you have with your health care provider. Document Released: 07/31/2001 Document Revised: 09/02/2017 Document Reviewed: 09/02/2017 Elsevier Interactive Patient Education  2019 [employer]

## 2019-05-01 NOTE — Progress Notes (Signed)
Date:  05/01/2019   Name:  Laura Adkins   DOB:  06/24/1956   MRN:  [id number]   Chief Complaint: Foot Swelling (R) ankle and foot swelling. X 3 days. Hot to touch and red. Tried to soak in espon salt and elevation. Its not getting any better. )  Foot Injury  The incident occurred 3 to 5 days ago. There was no injury mechanism. The pain is present in the right ankle and right heel. The quality of the pain is described as aching and shooting. The pain is mild. The pain has been fluctuating since onset. Pertinent negatives include no numbness or tingling. The symptoms are aggravated by weight bearing. She has tried acetaminophen for the symptoms. The treatment provided mild relief.  She has also noted that she has some fluid retention in the right lower leg.  It is better in the AM but worsens through the day.  She has tried elevating it. Yesterday her ankle was very red and warm and more painful.  She took tylenol last night and it is better today. She has some pain under the heel upon standing first thing in the AM.  It gets better during the day.  Review of Systems  Constitutional: Negative for chills, fatigue and fever.  Respiratory: Negative for cough, chest tightness and shortness of breath.   Cardiovascular: Positive for leg swelling. Negative for chest pain and palpitations.  Musculoskeletal: Positive for arthralgias, gait problem and joint swelling.  Neurological: Negative for dizziness, tingling, numbness and headaches.    Patient Active Problem List   Diagnosis Date Noted  . Hyperlipidemia associated with type 2 diabetes mellitus ([place]) 11/06/2017  . Low grade squamous intraepithelial lesion (LGSIL) on Papanicolaou smear of cervix 02/06/2016  . Controlled type 2 diabetes mellitus without complication, without long-term current use of insulin ([place]) 10/10/2015  . Coronary artery disease of native artery of native heart with stable angina pectoris ([place]) 10/10/2015  .  Fibrocystic breast 05/05/2015  . Essential (primary) hypertension 05/05/2015  . Migraine without aura and responsive to treatment 05/05/2015  . Obstructive apnea 05/05/2015  . Arthritis of knee, degenerative 05/05/2015  . Posterior tibial tendinitis 05/05/2015    Allergies  Allergen Reactions  . Ace Inhibitors Other (See Comments)  . Orange Oil Other (See Comments)    Unable to tolerate it.   . Celebrex  [Celecoxib] Rash    Past Surgical History:  Procedure Laterality Date  . BREAST SURGERY  05/1997   necrosis  . COLONOSCOPY  04/2008  . COLONOSCOPY  04/2018   normal - repeat 10 yrs    Social History   Tobacco Use  . Smoking status: Never Smoker  . Smokeless tobacco: Never Used  Substance Use Topics  . Alcohol use: No    Alcohol/week: 0.0 standard drinks  . Drug use: No     Medication list has been reviewed and updated.  Current Meds  Medication Sig  . aspirin 81 MG chewable tablet Chew 1 tablet by mouth daily.  Marland Kitchen atorvastatin (LIPITOR) 80 MG tablet Take 1 tablet (80 mg total) by mouth daily.  . Blood Glucose Monitoring Suppl (ONE TOUCH ULTRA SYSTEM KIT) w/Device KIT 1 kit by Does not apply route once.  . Calcium Carbonate-Vit D-Min (CALTRATE 600+D PLUS MINERALS) 600-800 MG-UNIT TABS Take 1 tablet by mouth daily.  Marland Kitchen KLOR-CON M20 20 MEQ tablet Take 1 tablet by mouth daily.  Marland Kitchen losartan (COZAAR) 100 MG tablet Take 1 tablet (100 mg total) by mouth  daily.  . metFORMIN (GLUCOPHAGE) 500 MG tablet Take 2 tablets (1,000 mg total) by mouth daily.  . Multiple Vitamins tablet Take by mouth.  . ONE TOUCH ULTRA TEST test strip USE TO TEST BLOOD SUGAR 2 TIMES DAILY.  Glory Rosebush Delica Lancets 42A MISC Apply 1 each topically daily. as directed  . torsemide (DEMADEX) 20 MG tablet Take 1 tablet by mouth daily.  Marland Kitchen triamcinolone cream (KENALOG) 0.1 % APPLY TO AFFECTED AREA TWICE A DAY    PHQ 2/9 Scores 05/01/2019 03/11/2019 11/07/2018 07/07/2018  PHQ - 2 Score 0 0 0 0  PHQ- 9 Score 0  0 - -    BP Readings from Last 3 Encounters:  05/01/19 100/62  03/11/19 126/82  11/07/18 118/68    Physical Exam Vitals signs and nursing note reviewed.  Constitutional:      General: She is not in acute distress.    Appearance: She is well-developed.  HENT:     Head: Normocephalic and atraumatic.  Cardiovascular:     Rate and Rhythm: Normal rate and regular rhythm.     Pulses:          Dorsalis pedis pulses are 2+ on the right side and 2+ on the left side.       Posterior tibial pulses are 2+ on the right side and 2+ on the left side.  Pulmonary:     Effort: Pulmonary effort is normal. No respiratory distress.     Breath sounds: No wheezing or rhonchi.  Musculoskeletal: Normal range of motion.     Right ankle: She exhibits normal range of motion, no swelling and no ecchymosis.     Right lower leg: 1+ Edema present.     Left lower leg: No edema.       Feet:  Skin:    General: Skin is warm and dry.     Findings: No rash.  Neurological:     Mental Status: She is alert and oriented to person, place, and time.  Psychiatric:        Behavior: Behavior normal.        Thought Content: Thought content normal.     Wt Readings from Last 3 Encounters:  05/01/19 203 lb 9.6 oz (92.4 kg)  03/11/19 204 lb (92.5 kg)  11/07/18 202 lb (91.6 kg)    BP 100/62   Pulse 62   Temp 97.9 F (36.6 C) (Oral)   Resp 12   Ht 5' 3"  (1.6 m)   Wt 203 lb 9.6 oz (92.4 kg)   SpO2 98%   BMI 36.07 kg/m   Assessment and Plan: 1. Acute right ankle pain Possible gout - check Uric acid Take tylenol as needed - Uric acid - CBC with Differential/Platelet  2. Localized edema Wear compression stockings Reduce salt intake Elevate  3. Pain of right heel Possible plantar fasciitis Use ice massage and tylenol   Partially dictated using Editor, commissioning. Any errors are unintentional.  Halina Maidens, MD [facility]  05/01/2019

## 2019-05-02 LAB — CBC WITH DIFFERENTIAL/PLATELET
Basophils Absolute: 0.1 10*3/uL (ref 0.0–0.2)
Basos: 1 %
EOS (ABSOLUTE): 0.2 10*3/uL (ref 0.0–0.4)
Eos: 4 %
Hematocrit: 35.2 % (ref 34.0–46.6)
Hemoglobin: 11.3 g/dL (ref 11.1–15.9)
Immature Grans (Abs): 0 10*3/uL (ref 0.0–0.1)
Immature Granulocytes: 0 %
Lymphocytes Absolute: 2.6 10*3/uL (ref 0.7–3.1)
Lymphs: 51 %
MCH: 26.5 pg — ABNORMAL LOW (ref 26.6–33.0)
MCHC: 32.1 g/dL (ref 31.5–35.7)
MCV: 83 fL (ref 79–97)
Monocytes Absolute: 0.4 10*3/uL (ref 0.1–0.9)
Monocytes: 8 %
Neutrophils Absolute: 1.8 10*3/uL (ref 1.4–7.0)
Neutrophils: 36 %
Platelets: 262 10*3/uL (ref 150–450)
RBC: 4.26 x10E6/uL (ref 3.77–5.28)
RDW: 14.2 % (ref 11.7–15.4)
WBC: 5.1 10*3/uL (ref 3.4–10.8)

## 2019-05-02 LAB — URIC ACID: Uric Acid: 4.8 mg/dL (ref 2.5–7.1)

## 2019-07-14 ENCOUNTER — Other Ambulatory Visit: Payer: Self-pay

## 2019-07-14 ENCOUNTER — Ambulatory Visit: Payer: BC Managed Care – PPO | Admitting: Internal Medicine

## 2019-07-14 ENCOUNTER — Encounter: Payer: Self-pay | Admitting: Internal Medicine

## 2019-07-14 VITALS — BP 112/72 | HR 57 | Ht 63.0 in | Wt 201.0 lb

## 2019-07-14 DIAGNOSIS — E118 Type 2 diabetes mellitus with unspecified complications: Secondary | ICD-10-CM | POA: Diagnosis not present

## 2019-07-14 DIAGNOSIS — Z23 Encounter for immunization: Secondary | ICD-10-CM | POA: Diagnosis not present

## 2019-07-14 DIAGNOSIS — I10 Essential (primary) hypertension: Secondary | ICD-10-CM

## 2019-07-14 NOTE — Progress Notes (Signed)
Date:  07/14/2019   Name:  Laura Adkins   DOB:  [birth year]   MRN:  [id number]   Chief Complaint: Diabetes (4 month follow up.)  Diabetes She presents for her follow-up diabetic visit. She has type 2 diabetes mellitus. Her disease course has been stable. Pertinent negatives for hypoglycemia include no headaches or tremors. Pertinent negatives for diabetes include no chest pain, no fatigue, no polydipsia and no polyuria. There are no hypoglycemic complications. Symptoms are stable. Current diabetic treatment includes oral agent (monotherapy). She is compliant with treatment all of the time. Her weight is stable. She is following a generally healthy diet. She participates in exercise daily. She monitors blood glucose at home 1-2 x per day. Her breakfast blood glucose is taken between 6-7 am. Her breakfast blood glucose range is generally 110-130 mg/dl. An ACE inhibitor/angiotensin II receptor blocker is being taken. Eye exam is current.  Hypertension This is a chronic problem. The problem is controlled. Pertinent negatives include no chest pain, headaches, palpitations or shortness of breath. Past treatments include angiotensin blockers. The current treatment provides significant improvement.   Lab Results  Component Value Date   HGBA1C 6.4 (H) 03/11/2019   Lab Results  Component Value Date   CREATININE 0.94 03/11/2019   BUN 12 03/11/2019   NA 141 03/11/2019   K 4.1 03/11/2019   CL 101 03/11/2019   CO2 26 03/11/2019     Review of Systems  Constitutional: Negative for appetite change, fatigue, fever and unexpected weight change.  HENT: Positive for hearing loss. Negative for tinnitus and trouble swallowing.   Eyes: Negative for visual disturbance.  Respiratory: Negative for cough, chest tightness and shortness of breath.   Cardiovascular: Negative for chest pain, palpitations and leg swelling.  Gastrointestinal: Negative for abdominal pain.  Endocrine: Negative for  polydipsia and polyuria.  Genitourinary: Negative for dysuria and hematuria.  Musculoskeletal: Negative for arthralgias.  Skin: Negative for color change and rash.  Neurological: Negative for tremors, numbness and headaches.  Psychiatric/Behavioral: Negative for dysphoric mood and sleep disturbance.    Patient Active Problem List   Diagnosis Date Noted  . Localized edema 05/01/2019  . Hyperlipidemia associated with type 2 diabetes mellitus ([place]) 11/06/2017  . Low grade squamous intraepithelial lesion (LGSIL) on Papanicolaou smear of cervix 02/06/2016  . Controlled type 2 diabetes mellitus without complication, without long-term current use of insulin ([place]) 10/10/2015  . Coronary artery disease of native artery of native heart with stable angina pectoris ([place]) 10/10/2015  . Fibrocystic breast 05/05/2015  . Essential (primary) hypertension 05/05/2015  . Migraine without aura and responsive to treatment 05/05/2015  . Obstructive apnea 05/05/2015  . Arthritis of knee, degenerative 05/05/2015  . Posterior tibial tendinitis 05/05/2015    Allergies  Allergen Reactions  . Ace Inhibitors Other (See Comments)  . Orange Oil Other (See Comments)    Unable to tolerate it.   . Celebrex  [Celecoxib] Rash    Past Surgical History:  Procedure Laterality Date  . BREAST SURGERY  05/1997   necrosis  . COLONOSCOPY  04/2008  . COLONOSCOPY  04/2018   normal - repeat 10 yrs    Social History   Tobacco Use  . Smoking status: Never Smoker  . Smokeless tobacco: Never Used  Substance Use Topics  . Alcohol use: No    Alcohol/week: 0.0 standard drinks  . Drug use: No     Medication list has been reviewed and updated.  Current Meds  Medication Sig  .  aspirin 81 MG chewable tablet Chew 1 tablet by mouth daily.  Marland Kitchen atorvastatin (LIPITOR) 80 MG tablet Take 1 tablet (80 mg total) by mouth daily.  . Blood Glucose Monitoring Suppl (ONE TOUCH ULTRA SYSTEM KIT) w/Device KIT 1 kit by Does not apply  route once.  . Calcium Carbonate-Vit D-Min (CALTRATE 600+D PLUS MINERALS) 600-800 MG-UNIT TABS Take 1 tablet by mouth daily.  Marland Kitchen KLOR-CON M20 20 MEQ tablet Take 1 tablet by mouth daily.  Marland Kitchen losartan (COZAAR) 100 MG tablet Take 1 tablet (100 mg total) by mouth daily.  . metFORMIN (GLUCOPHAGE) 500 MG tablet Take 2 tablets (1,000 mg total) by mouth daily.  . Multiple Vitamins tablet Take by mouth.  . ONE TOUCH ULTRA TEST test strip USE TO TEST BLOOD SUGAR 2 TIMES DAILY.  Glory Rosebush Delica Lancets 91Y MISC Apply 1 each topically daily. as directed  . torsemide (DEMADEX) 20 MG tablet Take 1 tablet by mouth daily.  Marland Kitchen triamcinolone cream (KENALOG) 0.1 % APPLY TO AFFECTED AREA TWICE A DAY    PHQ 2/9 Scores 07/14/2019 05/01/2019 03/11/2019 11/07/2018  PHQ - 2 Score 0 0 0 0  PHQ- 9 Score - 0 0 -    BP Readings from Last 3 Encounters:  07/14/19 112/72  05/01/19 100/62  03/11/19 126/82    Physical Exam Vitals signs and nursing note reviewed.  Constitutional:      General: She is not in acute distress.    Appearance: Normal appearance. She is well-developed.  HENT:     Head: Normocephalic and atraumatic.  Neck:     Musculoskeletal: Normal range of motion and neck supple.  Cardiovascular:     Rate and Rhythm: Normal rate and regular rhythm.     Pulses: Normal pulses.  Pulmonary:     Effort: Pulmonary effort is normal. No respiratory distress.  Musculoskeletal: Normal range of motion.     Right lower leg: No edema.     Left lower leg: No edema.  Lymphadenopathy:     Cervical: No cervical adenopathy.  Skin:    General: Skin is warm and dry.     Capillary Refill: Capillary refill takes less than 2 seconds.     Findings: No rash.  Neurological:     General: No focal deficit present.     Mental Status: She is alert and oriented to person, place, and time.  Psychiatric:        Behavior: Behavior normal.        Thought Content: Thought content normal.     Wt Readings from Last 3  Encounters:  07/14/19 201 lb (91.2 kg)  05/01/19 203 lb 9.6 oz (92.4 kg)  03/11/19 204 lb (92.5 kg)    BP 112/72   Pulse (!) 57   Ht _0  (1.6 m)   Wt 201 lb (91.2 kg)   SpO2 99%   BMI 35.61 kg/m   Assessment and Plan: 1. Type II diabetes mellitus with complication (HCC) Clinically stable by exam and report without s/s of hypoglycemia. DM complicated by HTN, CAD. Tolerating medications metformin well without side effects or other concerns. Eye exam and foot exam up to date - Hemoglobin A1c  2. Essential (primary) hypertension Clinically stable exam with well controlled BP.   Tolerating medications, cozaar 100 mg, without side effects at this time. Pt to continue current regimen and low sodium diet; benefits of regular exercise as able discussed.  3. Need for influenza vaccination - Flu Vaccine QUAD 36+ mos IM  Partially dictated using Editor, commissioning. Any errors are unintentional.  Halina Maidens, MD [facility]  07/14/2019

## 2019-07-15 LAB — HEMOGLOBIN A1C
Est. average glucose Bld gHb Est-mCnc: 134 mg/dL
Hgb A1c MFr Bld: 6.3 % — ABNORMAL HIGH (ref 4.8–5.6)

## 2019-10-14 ENCOUNTER — Other Ambulatory Visit: Payer: Self-pay | Admitting: Internal Medicine

## 2019-10-14 ENCOUNTER — Other Ambulatory Visit: Payer: Self-pay

## 2019-10-14 MED ORDER — GLUCOSE BLOOD VI STRP: ORAL_STRIP | 12 refills | Status: DC

## 2019-11-16 ENCOUNTER — Other Ambulatory Visit: Payer: Self-pay

## 2019-11-16 ENCOUNTER — Ambulatory Visit: Payer: BC Managed Care – PPO | Admitting: Internal Medicine

## 2019-11-16 ENCOUNTER — Encounter: Payer: Self-pay | Admitting: Internal Medicine

## 2019-11-16 VITALS — BP 126/74 | HR 64 | Temp 97.6°F | Ht 63.0 in | Wt 204.7 lb

## 2019-11-16 DIAGNOSIS — E118 Type 2 diabetes mellitus with unspecified complications: Secondary | ICD-10-CM

## 2019-11-16 DIAGNOSIS — I10 Essential (primary) hypertension: Secondary | ICD-10-CM | POA: Diagnosis not present

## 2019-11-16 DIAGNOSIS — Z1231 Encounter for screening mammogram for malignant neoplasm of breast: Secondary | ICD-10-CM

## 2019-11-16 NOTE — Progress Notes (Signed)
Date:  11/16/2019   Name:  Laura Adkins   DOB:  Jul 04, 1956   MRN:  [id number]   Chief Complaint: No chief complaint on file.  Diabetes She presents for her follow-up diabetic visit. She has type 2 diabetes mellitus. Her disease course has been stable. Pertinent negatives for hypoglycemia include no headaches or tremors. Pertinent negatives for diabetes include no chest pain, no fatigue, no polydipsia and no polyuria. Current diabetic treatment includes oral agent (monotherapy). She is compliant with treatment all of the time. She monitors blood glucose at home 3-4 x per week. There is no change in her home blood glucose trend. Her breakfast blood glucose is taken between 7-8 am. Her breakfast blood glucose range is generally 110-130 mg/dl. An ACE inhibitor/angiotensin II receptor blocker is being taken. Eye exam is current.  Hypertension This is a chronic problem. The problem is controlled. Pertinent negatives include no chest pain, headaches, palpitations or shortness of breath. Past treatments include angiotensin blockers. The current treatment provides significant improvement. There are no compliance problems.     Lab Results  Component Value Date   CREATININE 0.94 03/11/2019   BUN 12 03/11/2019   NA 141 03/11/2019   K 4.1 03/11/2019   CL 101 03/11/2019   CO2 26 03/11/2019   Lab Results  Component Value Date   CHOL 143 03/11/2019   HDL 62 03/11/2019   LDLCALC 60 03/11/2019   TRIG 105 03/11/2019   CHOLHDL 2.3 03/11/2019   Lab Results  Component Value Date   TSH 2.630 03/11/2019   Lab Results  Component Value Date   HGBA1C 6.3 (H) 07/14/2019     Review of Systems  Constitutional: Negative for appetite change, fatigue, fever and unexpected weight change.  HENT: Negative for tinnitus and trouble swallowing.   Eyes: Negative for visual disturbance.  Respiratory: Negative for cough, chest tightness and shortness of breath.   Cardiovascular: Negative for chest  pain, palpitations and leg swelling.  Gastrointestinal: Negative for abdominal pain.  Endocrine: Negative for polydipsia and polyuria.  Genitourinary: Negative for dysuria and hematuria.  Musculoskeletal: Negative for arthralgias.  Neurological: Negative for tremors, numbness and headaches.  Psychiatric/Behavioral: Negative for dysphoric mood.    Patient Active Problem List   Diagnosis Date Noted  . Localized edema 05/01/2019  . Hyperlipidemia associated with type 2 diabetes mellitus ([place]) 11/06/2017  . Low grade squamous intraepithelial lesion (LGSIL) on Papanicolaou smear of cervix 02/06/2016  . Type II diabetes mellitus with complication ([place]) 59/16/3846  . Coronary artery disease of native artery of native heart with stable angina pectoris ([place]) 10/10/2015  . Fibrocystic breast 05/05/2015  . Essential (primary) hypertension 05/05/2015  . Migraine without aura and responsive to treatment 05/05/2015  . Obstructive apnea 05/05/2015  . Arthritis of knee, degenerative 05/05/2015  . Posterior tibial tendinitis 05/05/2015    Allergies  Allergen Reactions  . Ace Inhibitors Other (See Comments)  . Orange Oil Other (See Comments)    Unable to tolerate it.   . Celebrex  [Celecoxib] Rash    Past Surgical History:  Procedure Laterality Date  . BREAST SURGERY  05/1997   necrosis  . COLONOSCOPY  04/2008  . COLONOSCOPY  04/2018   normal - repeat 10 yrs    Social History   Tobacco Use  . Smoking status: Never Smoker  . Smokeless tobacco: Never Used  Substance Use Topics  . Alcohol use: No    Alcohol/week: 0.0 standard drinks  . Drug use: No  Medication list has been reviewed and updated.  Current Meds  Medication Sig  . aspirin 81 MG chewable tablet Chew 1 tablet by mouth daily.  Marland Kitchen atorvastatin (LIPITOR) 80 MG tablet Take 1 tablet (80 mg total) by mouth daily.  . Blood Glucose Monitoring Suppl (ONE TOUCH ULTRA SYSTEM KIT) w/Device KIT 1 kit by Does not apply route  once.  . Calcium Carbonate-Vit D-Min (CALTRATE 600+D PLUS MINERALS) 600-800 MG-UNIT TABS Take 1 tablet by mouth daily.  Marland Kitchen glucose blood test strip Use to test blood sugar twice daily.  Marland Kitchen KLOR-CON M20 20 MEQ tablet Take 1 tablet by mouth daily.  Marland Kitchen losartan (COZAAR) 100 MG tablet Take 1 tablet (100 mg total) by mouth daily.  . metFORMIN (GLUCOPHAGE) 500 MG tablet Take 2 tablets (1,000 mg total) by mouth daily.  . Multiple Vitamins tablet Take by mouth.  Glory Rosebush Delica Lancets 67E MISC Apply 1 each topically daily. as directed  . torsemide (DEMADEX) 20 MG tablet Take 1 tablet by mouth daily.  Marland Kitchen triamcinolone cream (KENALOG) 0.1 % APPLY TO AFFECTED AREA TWICE A DAY    PHQ 2/9 Scores 07/14/2019 05/01/2019 03/11/2019 11/07/2018  PHQ - 2 Score 0 0 0 0  PHQ- 9 Score - 0 0 -    BP Readings from Last 3 Encounters:  11/16/19 126/74  07/14/19 112/72  05/01/19 100/62    Physical Exam Vitals and nursing note reviewed.  Constitutional:      General: She is not in acute distress.    Appearance: Normal appearance. She is well-developed.  HENT:     Head: Normocephalic and atraumatic.  Cardiovascular:     Rate and Rhythm: Normal rate and regular rhythm.     Pulses: Normal pulses.  Pulmonary:     Effort: Pulmonary effort is normal. No respiratory distress.     Breath sounds: No wheezing or rhonchi.  Musculoskeletal:     Cervical back: Normal range of motion.     Right lower leg: No edema.     Left lower leg: No edema.  Skin:    General: Skin is warm and dry.     Capillary Refill: Capillary refill takes less than 2 seconds.     Findings: No rash.  Neurological:     Mental Status: She is alert and oriented to person, place, and time.  Psychiatric:        Behavior: Behavior normal.        Thought Content: Thought content normal.     Wt Readings from Last 3 Encounters:  11/16/19 204 lb 11.2 oz (92.9 kg)  07/14/19 201 lb (91.2 kg)  05/01/19 203 lb 9.6 oz (92.4 kg)    BP 126/74    Pulse 64   Temp 97.6 F (36.4 C)   Ht 5' 3"  (1.6 m)   Wt 204 lb 11.2 oz (92.9 kg)   SpO2 96%   BMI 36.26 kg/m   Assessment and Plan: 1. Type II diabetes mellitus with complication (HCC) Clinically stable by exam and report without s/s of hypoglycemia. DM complicated by HTN. Needs to resume healthy diet - off track with the holidays and caring for her elderly brother Tolerating medications metformin well without side effects or other concerns. - Basic metabolic panel - Hemoglobin A1c  2. Essential (primary) hypertension Clinically stable exam with well controlled BP.   Tolerating medications, losartan and torsemide, without side effects at this time. Pt to continue current regimen and low sodium diet; benefits of regular exercise as able  discussed.  3. Encounter for screening mammogram for breast cancer To be scheduled at [facility] in January - MM 3D SCREEN BREAST BILATERAL; Future   Partially dictated using Editor, commissioning. Any errors are unintentional.  Halina Maidens, MD [facility]  11/16/2019

## 2019-11-17 LAB — BASIC METABOLIC PANEL
BUN/Creatinine Ratio: 15 (ref 12–28)
BUN: 16 mg/dL (ref 8–27)
CO2: 25 mmol/L (ref 20–29)
Calcium: 10 mg/dL (ref 8.7–10.3)
Chloride: 100 mmol/L (ref 96–106)
Creatinine, Ser: 1.04 mg/dL — ABNORMAL HIGH (ref 0.57–1.00)
GFR calc Af Amer: 67 mL/min/{1.73_m2} (ref >59)
GFR calc non Af Amer: 58 mL/min/{1.73_m2} — ABNORMAL LOW (ref >59)
Glucose: 76 mg/dL (ref 65–99)
Potassium: 4.1 mmol/L (ref 3.5–5.2)
Sodium: 140 mmol/L (ref 134–144)

## 2019-11-17 LAB — HEMOGLOBIN A1C
Est. average glucose Bld gHb Est-mCnc: 126 mg/dL
Hgb A1c MFr Bld: 6 % — ABNORMAL HIGH (ref 4.8–5.6)

## 2019-11-22 ENCOUNTER — Other Ambulatory Visit: Payer: Self-pay | Admitting: Internal Medicine

## 2019-11-22 DIAGNOSIS — E118 Type 2 diabetes mellitus with unspecified complications: Secondary | ICD-10-CM

## 2020-03-11 ENCOUNTER — Other Ambulatory Visit: Payer: Self-pay

## 2020-03-11 ENCOUNTER — Ambulatory Visit (INDEPENDENT_AMBULATORY_CARE_PROVIDER_SITE_OTHER): Payer: BC Managed Care – PPO | Admitting: Internal Medicine

## 2020-03-11 ENCOUNTER — Other Ambulatory Visit (HOSPITAL_COMMUNITY)
Admission: RE | Admit: 2020-03-11 | Discharge: 2020-03-11 | Disposition: A | Discharge status: Home / Self Care | Payer: BC Managed Care – PPO | Source: Ambulatory Visit | Attending: Internal Medicine | Admitting: Internal Medicine

## 2020-03-11 ENCOUNTER — Other Ambulatory Visit: Payer: Self-pay | Admitting: Internal Medicine

## 2020-03-11 ENCOUNTER — Encounter: Payer: Self-pay | Admitting: Internal Medicine

## 2020-03-11 VITALS — BP 128/84 | HR 59 | Temp 97.1°F | Ht 63.0 in | Wt 210.0 lb

## 2020-03-11 DIAGNOSIS — I25118 Atherosclerotic heart disease of native coronary artery with other forms of angina pectoris: Secondary | ICD-10-CM

## 2020-03-11 DIAGNOSIS — Z1231 Encounter for screening mammogram for malignant neoplasm of breast: Secondary | ICD-10-CM | POA: Diagnosis not present

## 2020-03-11 DIAGNOSIS — Z0000 Encounter for general adult medical examination without abnormal findings: Secondary | ICD-10-CM | POA: Diagnosis not present

## 2020-03-11 DIAGNOSIS — Z124 Encounter for screening for malignant neoplasm of cervix: Secondary | ICD-10-CM

## 2020-03-11 DIAGNOSIS — I10 Essential (primary) hypertension: Secondary | ICD-10-CM

## 2020-03-11 DIAGNOSIS — E118 Type 2 diabetes mellitus with unspecified complications: Secondary | ICD-10-CM

## 2020-03-11 DIAGNOSIS — E1169 Type 2 diabetes mellitus with other specified complication: Secondary | ICD-10-CM | POA: Diagnosis not present

## 2020-03-11 DIAGNOSIS — E785 Hyperlipidemia, unspecified: Secondary | ICD-10-CM

## 2020-03-11 DIAGNOSIS — Z9989 Dependence on other enabling machines and devices: Secondary | ICD-10-CM

## 2020-03-11 DIAGNOSIS — G4733 Obstructive sleep apnea (adult) (pediatric): Secondary | ICD-10-CM

## 2020-03-11 LAB — POCT URINALYSIS DIPSTICK
Bilirubin, UA: NEGATIVE
Blood, UA: NEGATIVE
Glucose, UA: NEGATIVE
Ketones, UA: NEGATIVE
Leukocytes, UA: NEGATIVE
Nitrite, UA: NEGATIVE
Protein, UA: NEGATIVE
Spec Grav, UA: 1.01 (ref 1.010–1.025)
Urobilinogen, UA: 0.2 E.U./dL
pH, UA: 7 (ref 5.0–8.0)

## 2020-03-11 MED ORDER — ATORVASTATIN CALCIUM 80 MG PO TABS: 80.0000 mg | ORAL_TABLET | Freq: Every day | ORAL | 1 refills | Status: DC

## 2020-03-11 MED ORDER — METFORMIN HCL 500 MG PO TABS: 1000.0000 mg | ORAL_TABLET | Freq: Every day | ORAL | 3 refills | Status: DC

## 2020-03-11 MED ORDER — LOSARTAN POTASSIUM 100 MG PO TABS: 100.0000 mg | ORAL_TABLET | Freq: Every day | ORAL | 3 refills | Status: DC

## 2020-03-11 MED ORDER — TORSEMIDE 20 MG PO TABS: 20.0000 mg | ORAL_TABLET | Freq: Every day | ORAL | 1 refills | Status: DC

## 2020-03-11 NOTE — Progress Notes (Signed)
Date:  03/11/2020   Name:  Laura Adkins   DOB:  [birth year]   MRN:  [id number]   Chief Complaint: Annual Exam (Breast Exam. Pap smear. ) Laura Adkins is a 64 y.o. female who presents today for her Complete Annual Exam. She feels fairly well. She reports exercising intermittently but not as much as before. She reports she is sleeping well. She denies breast issues - recent mammogram was normal.  Mammogram 11/2019 Colonoscopy  04/2018 Pap  01/2017 neg  Immunization History  Administered Date(s) Administered  . Influenza,inj,Quad PF,6+ Mos 10/10/2015, 10/08/2016, 08/14/2017, 09/09/2018, 07/14/2019  . Moderna SARS-COVID-2 Vaccination 02/08/2020  . Pneumococcal Polysaccharide-23 06/04/2013  . Pneumococcal-Unspecified 06/04/2013  . Tdap 06/28/2014, 02/06/2016    Hypertension This is a chronic problem. The problem is controlled. Pertinent negatives include no chest pain, headaches, palpitations or shortness of breath. Past treatments include angiotensin blockers. The current treatment provides significant improvement.  Diabetes She presents for her follow-up diabetic visit. She has type 2 diabetes mellitus. Her disease course has been stable. Pertinent negatives for hypoglycemia include no dizziness, headaches, nervousness/anxiousness or tremors. Pertinent negatives for diabetes include no chest pain, no fatigue, no polydipsia and no polyuria. Current diabetic treatment includes oral agent (monotherapy) (metformin). She is compliant with treatment all of the time. She participates in exercise three times a week. An ACE inhibitor/angiotensin II receptor blocker is being taken.  Hyperlipidemia This is a chronic problem. The problem is controlled. Pertinent negatives include no chest pain or shortness of breath. Current antihyperlipidemic treatment includes statins. The current treatment provides significant improvement of lipids.  CAD - 2016 - Stress test: LAD ischemia - metoprolol  started LHC - non obstructive CAD; 60% distal LAD, 60% mid LCx Followed by [facility].  Last visit 02/2019 with no changes made.  Lab Results  Component Value Date   CREATININE 1.04 (H) 11/16/2019   BUN 16 11/16/2019   NA 140 11/16/2019   K 4.1 11/16/2019   CL 100 11/16/2019   CO2 25 11/16/2019   Lab Results  Component Value Date   CHOL 143 03/11/2019   HDL 62 03/11/2019   LDLCALC 60 03/11/2019   TRIG 105 03/11/2019   CHOLHDL 2.3 03/11/2019   Lab Results  Component Value Date   TSH 2.630 03/11/2019   Lab Results  Component Value Date   HGBA1C 6.0 (H) 11/16/2019   Lab Results  Component Value Date   WBC 5.1 05/01/2019   HGB 11.3 05/01/2019   HCT 35.2 05/01/2019   MCV 83 05/01/2019   PLT 262 05/01/2019   Lab Results  Component Value Date   ALT 22 03/11/2019   AST 19 03/11/2019   ALKPHOS 99 03/11/2019   BILITOT 0.3 03/11/2019     Review of Systems  Constitutional: Positive for unexpected weight change (gained 10 lbs). Negative for chills, fatigue and fever.  HENT: Positive for hearing loss. Negative for congestion, tinnitus, trouble swallowing and voice change.   Eyes: Negative for pain, discharge and visual disturbance.  Respiratory: Negative for cough, chest tightness, shortness of breath and wheezing.   Cardiovascular: Negative for chest pain, palpitations and leg swelling.  Gastrointestinal: Negative for abdominal pain, blood in stool, constipation, diarrhea and vomiting.  Endocrine: Negative for polydipsia and polyuria.  Genitourinary: Negative for dysuria, frequency, genital sores, vaginal bleeding and vaginal discharge.  Musculoskeletal: Negative for arthralgias, gait problem and joint swelling.  Skin: Negative for color change and rash.  Neurological: Negative for dizziness, tremors,  light-headedness and headaches.  Hematological: Negative for adenopathy. Does not bruise/bleed easily.  Psychiatric/Behavioral: Negative for dysphoric mood and  sleep disturbance. The patient is not nervous/anxious.     Patient Active Problem List   Diagnosis Date Noted  . Localized edema 05/01/2019  . Hyperlipidemia associated with type 2 diabetes mellitus ([place]) 11/06/2017  . Low grade squamous intraepithelial lesion (LGSIL) on Papanicolaou smear of cervix 02/06/2016  . Type II diabetes mellitus with complication ([place]) 41/28/2081  . Coronary artery disease of native artery of native heart with stable angina pectoris ([place]) 10/10/2015  . Fibrocystic breast 05/05/2015  . Essential (primary) hypertension 05/05/2015  . Migraine without aura and responsive to treatment 05/05/2015  . OSA on CPAP 05/05/2015  . Arthritis of knee, degenerative 05/05/2015  . Posterior tibial tendinitis 05/05/2015  . LGSIL (low grade squamous intraepithelial dysplasia) 03/10/2014    Allergies  Allergen Reactions  . Ace Inhibitors Other (See Comments)  . Orange Oil Other (See Comments)    Unable to tolerate it.   . Celebrex  [Celecoxib] Rash    Past Surgical History:  Procedure Laterality Date  . BREAST SURGERY  05/1997   necrosis  . COLONOSCOPY  04/2008  . COLONOSCOPY  04/2018   normal - repeat 10 yrs    Social History   Tobacco Use  . Smoking status: Never Smoker  . Smokeless tobacco: Never Used  Substance Use Topics  . Alcohol use: No    Alcohol/week: 0.0 standard drinks  . Drug use: No     Medication list has been reviewed and updated.  Current Meds  Medication Sig  . aspirin 81 MG chewable tablet Chew 1 tablet by mouth daily.  Marland Kitchen atorvastatin (LIPITOR) 80 MG tablet TAKE 1 TABLET BY MOUTH EVERY DAY  . Blood Glucose Monitoring Suppl (ONE TOUCH ULTRA SYSTEM KIT) w/Device KIT 1 kit by Does not apply route once.  . Calcium Carbonate-Vit D-Min (CALTRATE 600+D PLUS MINERALS) 600-800 MG-UNIT TABS Take 1 tablet by mouth daily.  Marland Kitchen glucose blood test strip Use to test blood sugar twice daily.  Marland Kitchen KLOR-CON M20 20 MEQ tablet Take 1 tablet by mouth daily.    Marland Kitchen losartan (COZAAR) 100 MG tablet Take 1 tablet (100 mg total) by mouth daily.  . metFORMIN (GLUCOPHAGE) 500 MG tablet Take 2 tablets (1,000 mg total) by mouth daily.  . Multiple Vitamins tablet Take by mouth.  Glory Rosebush Delica Lancets 38I MISC APPLY 1 EACH TOPICALLY DAILY. AS DIRECTED  . torsemide (DEMADEX) 20 MG tablet Take 1 tablet by mouth daily.  Marland Kitchen triamcinolone cream (KENALOG) 0.1 % APPLY TO AFFECTED AREA TWICE A DAY    PHQ 2/9 Scores 03/11/2020 07/14/2019 05/01/2019 03/11/2019  PHQ - 2 Score 0 0 0 0  PHQ- 9 Score 0 - 0 0    BP Readings from Last 3 Encounters:  03/11/20 128/84  11/16/19 126/74  07/14/19 112/72    Physical Exam Vitals and nursing note reviewed.  Constitutional:      General: She is not in acute distress.    Appearance: She is well-developed.  HENT:     Head: Normocephalic and atraumatic.     Right Ear: Tympanic membrane, ear canal and external ear normal.     Left Ear: Tympanic membrane and external ear normal.     Nose:     Right Sinus: No maxillary sinus tenderness.     Left Sinus: No maxillary sinus tenderness.  Eyes:     General: No scleral icterus.  Right eye: No discharge.        Left eye: No discharge.     Conjunctiva/sclera: Conjunctivae normal.  Neck:     Thyroid: No thyromegaly.     Vascular: No carotid bruit.  Cardiovascular:     Rate and Rhythm: Normal rate and regular rhythm.     Pulses: Normal pulses.     Heart sounds: Normal heart sounds.  Pulmonary:     Effort: Pulmonary effort is normal. No respiratory distress.     Breath sounds: No wheezing.  Chest:     Breasts:        Right: No mass, nipple discharge, skin change or tenderness.        Left: No mass, nipple discharge, skin change or tenderness.  Abdominal:     General: Bowel sounds are normal.     Palpations: Abdomen is soft.     Tenderness: There is no abdominal tenderness.  Genitourinary:    Labia:        Right: No tenderness or lesion.        Left: No tenderness,  lesion or injury.      Vagina: Normal.     Cervix: Normal.     Uterus: Normal.      Adnexa: Right adnexa normal and left adnexa normal.     Comments: Pap obtained Musculoskeletal:        General: Normal range of motion.     Cervical back: Normal range of motion. No erythema.     Right lower leg: No edema.     Left lower leg: No edema.  Lymphadenopathy:     Cervical: No cervical adenopathy.  Skin:    General: Skin is warm and dry.     Findings: No rash.  Neurological:     Mental Status: She is alert and oriented to person, place, and time.     Cranial Nerves: No cranial nerve deficit.     Sensory: No sensory deficit.     Deep Tendon Reflexes: Reflexes are normal and symmetric.  Psychiatric:        Attention and Perception: Attention normal.        Mood and Affect: Mood normal.        Speech: Speech normal.        Behavior: Behavior normal.     Wt Readings from Last 3 Encounters:  03/11/20 210 lb (95.3 kg)  11/16/19 204 lb 11.2 oz (92.9 kg)  07/14/19 201 lb (91.2 kg)    BP 128/84   Pulse (!) 59   Temp (!) 97.1 F (36.2 C) (Temporal)   Ht 5' 3"  (1.6 m)   Wt 210 lb (95.3 kg)   SpO2 98%   BMI 37.20 kg/m   Assessment and Plan: 1. Annual physical exam Normal exam except for weight gain Need to resume regular exercise - POCT urinalysis dipstick  2. Encounter for screening mammogram for breast cancer Done in January  3. Essential (primary) hypertension Clinically stable exam with well controlled BP on losartan and torsemide. Tolerating medications without side effects at this time. Pt to continue current regimen and low sodium diet; benefits of regular exercise as able discussed. - CBC with Differential/Platelet - Comprehensive metabolic panel - losartan (COZAAR) 100 MG tablet; Take 1 tablet (100 mg total) by mouth daily.  Dispense: 90 tablet; Refill: 3 - torsemide (DEMADEX) 20 MG tablet; Take 1 tablet (20 mg total) by mouth daily.  Dispense: 90 tablet; Refill:  1  4. Coronary artery disease of  native artery of native heart with stable angina pectoris ([place]) Stable CAD - treated medically with HTN control, statin and aspirin. No evidence of disease progression but needs to avoid further weight gain and begin regular exercise  5. Hyperlipidemia associated with type 2 diabetes mellitus ([place]) Tolerating statin medication without side effects at this time LDL is at goal of < 70 on current dose Continue same therapy without change at this time. - Lipid panel - atorvastatin (LIPITOR) 80 MG tablet; Take 1 tablet (80 mg total) by mouth daily.  Dispense: 90 tablet; Refill: 1  6. Type II diabetes mellitus with complication ([facility]) Clinically stable by exam and report without s/s of hypoglycemia. DM complicated by CAD, lipids. Pt is reminded to schedule DM eye exam Tolerating medications  well without side effects or other concerns. - Hemoglobin A1c - TSH - metFORMIN (GLUCOPHAGE) 500 MG tablet; Take 2 tablets (1,000 mg total) by mouth daily.  Dispense: 180 tablet; Refill: 3  7. OSA on CPAP Doing well with refreshing sleep and no daytime somnolence or headache  8. Encounter for screening for cervical cancer  Pap obtained today - Cytology - PAP   Partially dictated using Dragon software. Any errors are unintentional.  Halina Maidens, MD [facility]  03/11/2020

## 2020-03-11 NOTE — Patient Instructions (Signed)
Exercising to Stay Healthy To become healthy and stay healthy, it is recommended that you do moderate-intensity and vigorous-intensity exercise. You can tell that you are exercising at a moderate intensity if your heart starts beating faster and you start breathing faster but can still hold a conversation. You can tell that you are exercising at a vigorous intensity if you are breathing much harder and faster and cannot hold a conversation while exercising. Exercising regularly is important. It has many health benefits, such as:  Improving overall fitness, flexibility, and endurance.  Increasing bone density.  Helping with weight control.  Decreasing body fat.  Increasing muscle strength.  Reducing stress and tension.  Improving overall health. How often should I exercise? Choose an activity that you enjoy, and set realistic goals. Your health care provider can help you make an activity plan that works for you. Exercise regularly as told by your health care provider. This may include:  Doing strength training two times a week, such as: ? Lifting weights. ? Using resistance bands. ? Push-ups. ? Sit-ups. ? Yoga.  Doing a certain intensity of exercise for a given amount of time. Choose from these options: ? A total of 150 minutes of moderate-intensity exercise every week. ? A total of 75 minutes of vigorous-intensity exercise every week. ? A mix of moderate-intensity and vigorous-intensity exercise every week. Children, pregnant women, people who have not exercised regularly, people who are overweight, and older adults may need to talk with a health care provider about what activities are safe to do. If you have a medical condition, be sure to talk with your health care provider before you start a new exercise program. What are some exercise ideas? Moderate-intensity exercise ideas include:  Walking 1 mile (1.6 km) in about 15  minutes.  Biking.  Hiking.  Golfing.  Dancing.  Water aerobics. Vigorous-intensity exercise ideas include:  Walking 4.5 miles (7.2 km) or more in about 1 hour.  Jogging or running 5 miles (8 km) in about 1 hour.  Biking 10 miles (16.1 km) or more in about 1 hour.  Lap swimming.  Roller-skating or in-line skating.  Cross-country skiing.  Vigorous competitive sports, such as football, basketball, and soccer.  Jumping rope.  Aerobic dancing. What are some everyday activities that can help me to get exercise?  Yard work, such as: ? Pushing a lawn mower. ? Raking and bagging leaves.  Washing your car.  Pushing a stroller.  Shoveling snow.  Gardening.  Washing windows or floors. How can I be more active in my day-to-day activities?  Use stairs instead of an elevator.  Take a walk during your lunch break.  If you drive, park your car farther away from your work or school.  If you take public transportation, get off one stop early and walk the rest of the way.  Stand up or walk around during all of your indoor phone calls.  Get up, stretch, and walk around every 30 minutes throughout the day.  Enjoy exercise with a friend. Support to continue exercising will help you keep a regular routine of activity. What guidelines can I follow while exercising?  Before you start a new exercise program, talk with your health care provider.  Do not exercise so much that you hurt yourself, feel dizzy, or get very short of breath.  Wear comfortable clothes and wear shoes with good support.  Drink plenty of water while you exercise to prevent dehydration or heat stroke.  Work out until your breathing   and your heartbeat get faster. Where to find more information  [place] Department of Health and Human Services: www.hhs.gov  Centers for Disease Control and Prevention (CDC): www.cdc.gov Summary  Exercising regularly is important. It will improve your overall fitness,  flexibility, and endurance.  Regular exercise also will improve your overall health. It can help you control your weight, reduce stress, and improve your bone density.  Do not exercise so much that you hurt yourself, feel dizzy, or get very short of breath.  Before you start a new exercise program, talk with your health care provider. This information is not intended to replace advice given to you by your health care provider. Make sure you discuss any questions you have with your health care provider. Document Revised: 10/18/2017 Document Reviewed: 09/26/2017 Elsevier Patient Education  2020 Elsevier Inc.  

## 2020-03-12 LAB — CBC WITH DIFFERENTIAL/PLATELET
Basophils Absolute: 0.1 10*3/uL (ref 0.0–0.2)
Basos: 1 %
EOS (ABSOLUTE): 0.1 10*3/uL (ref 0.0–0.4)
Eos: 3 %
Hematocrit: 35.4 % (ref 34.0–46.6)
Hemoglobin: 11.3 g/dL (ref 11.1–15.9)
Immature Grans (Abs): 0 10*3/uL (ref 0.0–0.1)
Immature Granulocytes: 0 %
Lymphocytes Absolute: 2.2 10*3/uL (ref 0.7–3.1)
Lymphs: 44 %
MCH: 26 pg — ABNORMAL LOW (ref 26.6–33.0)
MCHC: 31.9 g/dL (ref 31.5–35.7)
MCV: 82 fL (ref 79–97)
Monocytes Absolute: 0.5 10*3/uL (ref 0.1–0.9)
Monocytes: 10 %
Neutrophils Absolute: 2 10*3/uL (ref 1.4–7.0)
Neutrophils: 42 %
Platelets: 247 10*3/uL (ref 150–450)
RBC: 4.34 x10E6/uL (ref 3.77–5.28)
RDW: 13.5 % (ref 11.7–15.4)
WBC: 4.8 10*3/uL (ref 3.4–10.8)

## 2020-03-12 LAB — COMPREHENSIVE METABOLIC PANEL
ALT: 23 IU/L (ref 0–32)
AST: 18 IU/L (ref 0–40)
Albumin/Globulin Ratio: 1.5 (ref 1.2–2.2)
Albumin: 4.3 g/dL (ref 3.8–4.8)
Alkaline Phosphatase: 97 IU/L (ref 39–117)
BUN/Creatinine Ratio: 14 (ref 12–28)
BUN: 12 mg/dL (ref 8–27)
Bilirubin Total: 0.4 mg/dL (ref 0.0–1.2)
CO2: 23 mmol/L (ref 20–29)
Calcium: 9.5 mg/dL (ref 8.7–10.3)
Chloride: 105 mmol/L (ref 96–106)
Creatinine, Ser: 0.87 mg/dL (ref 0.57–1.00)
GFR calc Af Amer: 83 mL/min/{1.73_m2} (ref >59)
GFR calc non Af Amer: 72 mL/min/{1.73_m2} (ref >59)
Globulin, Total: 2.9 g/dL (ref 1.5–4.5)
Glucose: 105 mg/dL — ABNORMAL HIGH (ref 65–99)
Potassium: 4.4 mmol/L (ref 3.5–5.2)
Sodium: 142 mmol/L (ref 134–144)
Total Protein: 7.2 g/dL (ref 6.0–8.5)

## 2020-03-12 LAB — LIPID PANEL
Chol/HDL Ratio: 2.1 ratio (ref 0.0–4.4)
Cholesterol, Total: 141 mg/dL (ref 100–199)
HDL: 66 mg/dL (ref >39)
LDL Chol Calc (NIH): 59 mg/dL (ref 0–99)
Triglycerides: 84 mg/dL (ref 0–149)
VLDL Cholesterol Cal: 16 mg/dL (ref 5–40)

## 2020-03-12 LAB — HEMOGLOBIN A1C
Est. average glucose Bld gHb Est-mCnc: 143 mg/dL
Hgb A1c MFr Bld: 6.6 % — ABNORMAL HIGH (ref 4.8–5.6)

## 2020-03-12 LAB — TSH: TSH: 1.75 u[IU]/mL (ref 0.450–4.500)

## 2020-03-14 ENCOUNTER — Telehealth: Payer: Self-pay

## 2020-03-14 NOTE — Telephone Encounter (Signed)
Opened in error

## 2020-03-16 LAB — CYTOLOGY - PAP
Comment: NEGATIVE
Diagnosis: UNDETERMINED — AB
High risk HPV: NEGATIVE

## 2020-04-23 LAB — HM DIABETES EYE EXAM

## 2020-06-24 ENCOUNTER — Other Ambulatory Visit: Payer: Self-pay | Admitting: Internal Medicine

## 2020-06-24 NOTE — Telephone Encounter (Signed)
Medication Refill - Medication: KLOR-CON M20 20 MEQ tablet [[id number]]    Preferred Pharmacy (with phone number or street name):  [id number] - [place], [place] - [address] AT [address]  [address] [place] [address]  Phone: [phone] Fax: [phone]     Agent: Please be advised that RX refills may take up to 3 business days. We ask that you follow-up with your pharmacy.

## 2020-06-24 NOTE — Telephone Encounter (Signed)
Requested medication (s) are due for refill today - expired Rx  Requested medication (s) are on the active medication list -yes  Future visit scheduled -yes  Last refill: 5 years  Notes to clinic: Patient is requesting RF of expired Rx by historical provider. Sent to office for review.  Requested Prescriptions  Pending Prescriptions Disp Refills   KLOR-CON M20 20 MEQ tablet  11    Sig: Take 1 tablet (20 mEq total) by mouth daily.      Endocrinology:  Minerals - Potassium Supplementation Passed - 06/24/2020 12:32 PM      Passed - K in normal range and within 360 days    Potassium  Date Value Ref Range Status  03/11/2020 4.4 3.5 - 5.2 mmol/L Final          Passed - Cr in normal range and within 360 days    Creatinine, Ser  Date Value Ref Range Status  03/11/2020 0.87 0.57 - 1.00 mg/dL Final          Passed - Valid encounter within last 12 months    Recent Outpatient Visits           3 months ago Annual physical exam   [facility] Reubin Milan, MD   7 months ago Type II diabetes mellitus with complication [facility])   [facility] Reubin Milan, MD   11 months ago Type II diabetes mellitus with complication [facility])   [facility] Reubin Milan, MD   1 year ago Acute right ankle pain   [facility] Reubin Milan, MD   1 year ago Annual physical exam   [facility] Reubin Milan, MD       Future Appointments             In 2 weeks Judithann Graves Nyoka Cowden, MD [facility]   In 8 months Reubin Milan, MD [facility]                Requested Prescriptions  Pending Prescriptions Disp Refills   KLOR-CON M20 20 MEQ tablet  11    Sig: Take 1 tablet (20 mEq total) by mouth daily.      Endocrinology:  Minerals - Potassium Supplementation Passed - 06/24/2020 12:32 PM      Passed - K in normal range and within 360 days    Potassium  Date Value Ref Range Status  03/11/2020 4.4 3.5  - 5.2 mmol/L Final          Passed - Cr in normal range and within 360 days    Creatinine, Ser  Date Value Ref Range Status  03/11/2020 0.87 0.57 - 1.00 mg/dL Final          Passed - Valid encounter within last 12 months    Recent Outpatient Visits           3 months ago Annual physical exam   [facility] Reubin Milan, MD   7 months ago Type II diabetes mellitus with complication [facility])   [facility] Reubin Milan, MD   11 months ago Type II diabetes mellitus with complication [facility])   [facility] Reubin Milan, MD   1 year ago Acute right ankle pain   [facility] Reubin Milan, MD   1 year ago Annual physical exam   [facility] Reubin Milan, MD  Future Appointments             In 2 weeks Judithann Graves Nyoka Cowden, MD [facility], [facility]   In 8 months Judithann Graves Nyoka Cowden, MD [facility], [facility]

## 2020-07-13 ENCOUNTER — Ambulatory Visit: Payer: BC Managed Care – PPO | Admitting: Internal Medicine

## 2020-07-14 ENCOUNTER — Encounter: Payer: Self-pay | Admitting: Internal Medicine

## 2020-07-14 ENCOUNTER — Ambulatory Visit (INDEPENDENT_AMBULATORY_CARE_PROVIDER_SITE_OTHER): Payer: BC Managed Care – PPO | Admitting: Internal Medicine

## 2020-07-14 ENCOUNTER — Other Ambulatory Visit: Payer: Self-pay

## 2020-07-14 VITALS — BP 112/70 | HR 53 | Temp 97.8°F | Ht 63.0 in | Wt 208.0 lb

## 2020-07-14 DIAGNOSIS — M7672 Peroneal tendinitis, left leg: Secondary | ICD-10-CM

## 2020-07-14 DIAGNOSIS — Z23 Encounter for immunization: Secondary | ICD-10-CM | POA: Diagnosis not present

## 2020-07-14 DIAGNOSIS — E118 Type 2 diabetes mellitus with unspecified complications: Secondary | ICD-10-CM

## 2020-07-14 DIAGNOSIS — I10 Essential (primary) hypertension: Secondary | ICD-10-CM

## 2020-07-14 NOTE — Progress Notes (Signed)
Date:  07/14/2020   Name:  Laura Adkins   DOB:  [birth year]   MRN:  [id number]   Chief Complaint: Diabetes (follow up / flu shot last reading 137 this morning ) and Hypertension  Diabetes She presents for her follow-up diabetic visit. She has type 2 diabetes mellitus. Her disease course has been stable. Pertinent negatives for hypoglycemia include no headaches or tremors. Pertinent negatives for diabetes include no chest pain, no fatigue, no polydipsia and no polyuria. Symptoms are stable. Current diabetic treatment includes oral agent (monotherapy). She is compliant with treatment all of the time. An ACE inhibitor/angiotensin II receptor blocker is being taken. Eye exam is current.  Hypertension This is a chronic problem. The problem is controlled. Pertinent negatives include no chest pain, headaches, palpitations or shortness of breath. Past treatments include angiotensin blockers. The current treatment provides significant improvement. There are no compliance problems.    Immunization History  Administered Date(s) Administered  . Influenza,inj,Quad PF,6+ Mos 10/10/2015, 10/08/2016, 08/14/2017, 09/09/2018, 07/14/2019  . Moderna SARS-COVID-2 Vaccination 02/08/2020, 03/11/2020  . Pneumococcal Polysaccharide-23 06/04/2013  . Pneumococcal-Unspecified 06/04/2013  . Tdap 06/28/2014, 02/06/2016    Lab Results  Component Value Date   CREATININE 0.87 03/11/2020   BUN 12 03/11/2020   NA 142 03/11/2020   K 4.4 03/11/2020   CL 105 03/11/2020   CO2 23 03/11/2020   Lab Results  Component Value Date   CHOL 141 03/11/2020   HDL 66 03/11/2020   LDLCALC 59 03/11/2020   TRIG 84 03/11/2020   CHOLHDL 2.1 03/11/2020   Lab Results  Component Value Date   TSH 1.750 03/11/2020   Lab Results  Component Value Date   HGBA1C 6.6 (H) 03/11/2020   Lab Results  Component Value Date   WBC 4.8 03/11/2020   HGB 11.3 03/11/2020   HCT 35.4 03/11/2020   MCV 82 03/11/2020   PLT 247  03/11/2020   Lab Results  Component Value Date   ALT 23 03/11/2020   AST 18 03/11/2020   ALKPHOS 97 03/11/2020   BILITOT 0.4 03/11/2020     Review of Systems  Constitutional: Negative for appetite change, fatigue, fever and unexpected weight change.  HENT: Negative for tinnitus and trouble swallowing.   Eyes: Negative for visual disturbance.  Respiratory: Negative for cough, chest tightness and shortness of breath.   Cardiovascular: Negative for chest pain, palpitations and leg swelling.  Gastrointestinal: Negative for abdominal pain.  Endocrine: Negative for polydipsia and polyuria.  Genitourinary: Negative for dysuria and hematuria.  Musculoskeletal: Negative for arthralgias.  Neurological: Negative for tremors, numbness and headaches.  Psychiatric/Behavioral: Negative for dysphoric mood.    Patient Active Problem List   Diagnosis Date Noted  . Localized edema 05/01/2019  . Hyperlipidemia associated with type 2 diabetes mellitus ([place]) 11/06/2017  . Low grade squamous intraepithelial lesion (LGSIL) on Papanicolaou smear of cervix 02/06/2016  . Type II diabetes mellitus with complication ([place]) 71/04/2693  . Coronary artery disease of native artery of native heart with stable angina pectoris ([place]) 10/10/2015  . Fibrocystic breast 05/05/2015  . Essential (primary) hypertension 05/05/2015  . Migraine without aura and responsive to treatment 05/05/2015  . OSA on CPAP 05/05/2015  . Arthritis of knee, degenerative 05/05/2015  . Posterior tibial tendinitis 05/05/2015  . LGSIL (low grade squamous intraepithelial dysplasia) 03/10/2014    Allergies  Allergen Reactions  . Ace Inhibitors Other (See Comments)  . Orange Oil Other (See Comments)    Unable to tolerate it.   Marland Kitchen  Celebrex  [Celecoxib] Rash    Past Surgical History:  Procedure Laterality Date  . BREAST SURGERY  05/1997   necrosis  . COLONOSCOPY  04/2008  . COLONOSCOPY  04/2018   normal - repeat 10 yrs    Social  History   Tobacco Use  . Smoking status: Never Smoker  . Smokeless tobacco: Never Used  Vaping Use  . Vaping Use: Never used  Substance Use Topics  . Alcohol use: No    Alcohol/week: 0.0 standard drinks  . Drug use: No     Medication list has been reviewed and updated.  Current Meds  Medication Sig  . aspirin 81 MG chewable tablet Chew 1 tablet by mouth daily.  Marland Kitchen atorvastatin (LIPITOR) 80 MG tablet Take 1 tablet (80 mg total) by mouth daily.  . Blood Glucose Monitoring Suppl (ONE TOUCH ULTRA SYSTEM KIT) w/Device KIT 1 kit by Does not apply route once.  . Calcium Carbonate-Vit D-Min (CALTRATE 600+D PLUS MINERALS) 600-800 MG-UNIT TABS Take 1 tablet by mouth daily.  Marland Kitchen glucose blood test strip Use to test blood sugar twice daily.  Marland Kitchen KLOR-CON M20 20 MEQ tablet Take 1 tablet by mouth daily.  Marland Kitchen losartan (COZAAR) 100 MG tablet Take 1 tablet (100 mg total) by mouth daily.  . metFORMIN (GLUCOPHAGE) 500 MG tablet Take 2 tablets (1,000 mg total) by mouth daily.  Glory Rosebush Delica Lancets 78L MISC APPLY 1 EACH TOPICALLY DAILY. AS DIRECTED  . torsemide (DEMADEX) 20 MG tablet Take 1 tablet (20 mg total) by mouth daily.  Marland Kitchen triamcinolone cream (KENALOG) 0.1 % APPLY TO AFFECTED AREA TWICE A DAY    PHQ 2/9 Scores 03/11/2020 07/14/2019 05/01/2019 03/11/2019  PHQ - 2 Score 0 0 0 0  PHQ- 9 Score 0 - 0 0    GAD 7 : Generalized Anxiety Score 03/11/2020  Nervous, Anxious, on Edge 0  Control/stop worrying 0  Worry too much - different things 0  Trouble relaxing 0  Restless 0  Easily annoyed or irritable 0  Afraid - awful might happen 0  Total GAD 7 Score 0  Anxiety Difficulty Not difficult at all    BP Readings from Last 3 Encounters:  07/14/20 112/70  03/11/20 128/84  11/16/19 126/74    Physical Exam Vitals and nursing note reviewed.  Constitutional:      General: She is not in acute distress.    Appearance: Normal appearance. She is well-developed.  HENT:     Head: Normocephalic and  atraumatic.  Cardiovascular:     Rate and Rhythm: Normal rate and regular rhythm.     Pulses: Normal pulses.     Heart sounds: No murmur heard.   Pulmonary:     Effort: Pulmonary effort is normal. No respiratory distress.     Breath sounds: No wheezing or rhonchi.  Musculoskeletal:     Cervical back: Normal range of motion.     Right lower leg: No edema.     Left lower leg: No edema.  Lymphadenopathy:     Cervical: No cervical adenopathy.  Skin:    General: Skin is warm and dry.     Findings: No rash.  Neurological:     General: No focal deficit present.     Mental Status: She is alert and oriented to person, place, and time.  Psychiatric:        Mood and Affect: Mood normal.     Wt Readings from Last 3 Encounters:  07/14/20 208 lb (94.3 kg)  03/11/20 210 lb (95.3 kg)  11/16/19 204 lb 11.2 oz (92.9 kg)    BP 112/70   Pulse (!) 53   Temp 97.8 F (36.6 C) (Oral)   Ht _0  (1.6 m)   Wt 208 lb (94.3 kg)   SpO2 100%   BMI 36.85 kg/m   Assessment and Plan: 1. Essential (primary) hypertension Clinically stable exam with well controlled BP on losartan and demadex. Tolerating medications without side effects at this time. Pt to continue current regimen and low sodium diet; benefits of regular exercise as able discussed.  Weight is improved today  2. Type II diabetes mellitus with complication (HCC) Clinically stable by exam and report without s/s of hypoglycemia. DM complicated by HTN. Tolerating medications metformin -  well without side effects or other concerns. - Hemoglobin A1c  3. Peroneal tendonitis of left lower leg Much improved with ice, tylenol and elevation  4. Need for immunization against influenza - Flu Vaccine QUAD 36+ mos IM   Partially dictated using Editor, commissioning. Any errors are unintentional.  Halina Maidens, MD [facility]  07/14/2020

## 2020-07-15 LAB — HEMOGLOBIN A1C
Est. average glucose Bld gHb Est-mCnc: 143 mg/dL
Hgb A1c MFr Bld: 6.6 % — ABNORMAL HIGH (ref 4.8–5.6)

## 2020-09-25 ENCOUNTER — Other Ambulatory Visit: Payer: Self-pay | Admitting: Internal Medicine

## 2020-09-25 DIAGNOSIS — I10 Essential (primary) hypertension: Secondary | ICD-10-CM

## 2020-09-25 NOTE — Telephone Encounter (Signed)
Requested Prescriptions  Pending Prescriptions Disp Refills   torsemide (DEMADEX) 20 MG tablet [Pharmacy Med Name: TORSEMIDE 20 MG TABLET] 90 tablet 1    Sig: TAKE 1 TABLET BY MOUTH EVERY DAY     Cardiovascular:  Diuretics - Loop Passed - 09/25/2020  8:55 AM      Passed - K in normal range and within 360 days    Potassium  Date Value Ref Range Status  03/11/2020 4.4 3.5 - 5.2 mmol/L Final         Passed - Ca in normal range and within 360 days    Calcium  Date Value Ref Range Status  03/11/2020 9.5 8.7 - 10.3 mg/dL Final         Passed - Na in normal range and within 360 days    Sodium  Date Value Ref Range Status  03/11/2020 142 134 - 144 mmol/L Final         Passed - Cr in normal range and within 360 days    Creatinine, Ser  Date Value Ref Range Status  03/11/2020 0.87 0.57 - 1.00 mg/dL Final         Passed - Last BP in normal range    BP Readings from Last 1 Encounters:  07/14/20 112/70         Passed - Valid encounter within last 6 months    Recent Outpatient Visits          2 months ago Essential (primary) hypertension   [facility] Reubin Milan, MD   6 months ago Annual physical exam   [facility] Reubin Milan, MD   10 months ago Type II diabetes mellitus with complication [facility])   [facility] Reubin Milan, MD   1 year ago Type II diabetes mellitus with complication [facility])   [facility] Reubin Milan, MD   1 year ago Acute right ankle pain   [facility] Reubin Milan, MD      Future Appointments            In 1 month Judithann Graves Nyoka Cowden, MD [facility]   In 5 months Judithann Graves, Nyoka Cowden, MD [facility], [facility]

## 2020-10-19 ENCOUNTER — Other Ambulatory Visit: Payer: Self-pay | Admitting: Internal Medicine

## 2020-11-01 ENCOUNTER — Other Ambulatory Visit: Payer: Self-pay

## 2020-11-01 ENCOUNTER — Ambulatory Visit: Payer: BC Managed Care – PPO | Admitting: Internal Medicine

## 2020-11-01 ENCOUNTER — Encounter: Payer: Self-pay | Admitting: Internal Medicine

## 2020-11-01 VITALS — BP 136/82 | HR 58 | Temp 98.0°F | Ht 63.0 in | Wt 214.0 lb

## 2020-11-01 DIAGNOSIS — E118 Type 2 diabetes mellitus with unspecified complications: Secondary | ICD-10-CM | POA: Diagnosis not present

## 2020-11-01 DIAGNOSIS — E785 Hyperlipidemia, unspecified: Secondary | ICD-10-CM | POA: Diagnosis not present

## 2020-11-01 DIAGNOSIS — E1169 Type 2 diabetes mellitus with other specified complication: Secondary | ICD-10-CM | POA: Diagnosis not present

## 2020-11-01 DIAGNOSIS — I10 Essential (primary) hypertension: Secondary | ICD-10-CM

## 2020-11-01 MED ORDER — ATORVASTATIN CALCIUM 80 MG PO TABS: 80.0000 mg | ORAL_TABLET | Freq: Every day | ORAL | 1 refills | Status: DC

## 2020-11-01 NOTE — Progress Notes (Signed)
Date:  11/01/2020   Name:  Laura Adkins   DOB:  1956/04/23   MRN:  [id number]   Chief Complaint: Diabetes (F/u this morning 142/) and Hypertension  Diabetes She presents for her follow-up diabetic visit. She has type 2 diabetes mellitus. Her disease course has been stable. Pertinent negatives for hypoglycemia include no headaches or tremors. Pertinent negatives for diabetes include no chest pain, no fatigue, no polydipsia and no polyuria. There are no hypoglycemic complications. Current diabetic treatment includes oral agent (monotherapy). She is compliant with treatment all of the time. Weight trend: has increased a bit from Thanksgiving plus at least 5 funerals with associated food and less exercise. She is following a generally healthy diet. She rarely participates in exercise. She monitors blood glucose at home 1-2 x per day. Her breakfast blood glucose is taken between 7-8 am. Her breakfast blood glucose range is generally 130-140 mg/dl. Eye exam is current.  Hypertension This is a chronic problem. The problem is controlled. Pertinent negatives include no chest pain, headaches, palpitations or shortness of breath. Past treatments include angiotensin blockers.    Lab Results  Component Value Date   CREATININE 0.87 03/11/2020   BUN 12 03/11/2020   NA 142 03/11/2020   K 4.4 03/11/2020   CL 105 03/11/2020   CO2 23 03/11/2020   Lab Results  Component Value Date   CHOL 141 03/11/2020   HDL 66 03/11/2020   LDLCALC 59 03/11/2020   TRIG 84 03/11/2020   CHOLHDL 2.1 03/11/2020   Lab Results  Component Value Date   TSH 1.750 03/11/2020   Lab Results  Component Value Date   HGBA1C 6.6 (H) 07/14/2020   Lab Results  Component Value Date   WBC 4.8 03/11/2020   HGB 11.3 03/11/2020   HCT 35.4 03/11/2020   MCV 82 03/11/2020   PLT 247 03/11/2020   Lab Results  Component Value Date   ALT 23 03/11/2020   AST 18 03/11/2020   ALKPHOS 97 03/11/2020   BILITOT 0.4  03/11/2020     Review of Systems  Constitutional: Positive for unexpected weight change. Negative for appetite change, fatigue and fever.  HENT: Negative for tinnitus and trouble swallowing.   Eyes: Negative for visual disturbance.  Respiratory: Negative for cough, chest tightness and shortness of breath.   Cardiovascular: Negative for chest pain, palpitations and leg swelling.  Gastrointestinal: Negative for abdominal pain.  Endocrine: Negative for polydipsia and polyuria.  Genitourinary: Negative for dysuria and hematuria.  Musculoskeletal: Negative for arthralgias.  Neurological: Negative for tremors, numbness and headaches.  Psychiatric/Behavioral: Negative for dysphoric mood.    Patient Active Problem List   Diagnosis Date Noted  . Localized edema 05/01/2019  . Hyperlipidemia associated with type 2 diabetes mellitus ([place]) 11/06/2017  . Low grade squamous intraepithelial lesion (LGSIL) on Papanicolaou smear of cervix 02/06/2016  . Type II diabetes mellitus with complication ([place]) 19/37/9024  . Coronary artery disease of native artery of native heart with stable angina pectoris ([place]) 10/10/2015  . Fibrocystic breast 05/05/2015  . Essential (primary) hypertension 05/05/2015  . Migraine without aura and responsive to treatment 05/05/2015  . OSA on CPAP 05/05/2015  . Arthritis of knee, degenerative 05/05/2015  . Posterior tibial tendinitis 05/05/2015  . LGSIL (low grade squamous intraepithelial dysplasia) 03/10/2014    Allergies  Allergen Reactions  . Ace Inhibitors Other (See Comments)  . Orange Oil Other (See Comments)    Unable to tolerate it.   . Celebrex  [Celecoxib]  Rash    Past Surgical History:  Procedure Laterality Date  . BREAST SURGERY  05/1997   necrosis  . COLONOSCOPY  04/2008  . COLONOSCOPY  04/2018   normal - repeat 10 yrs    Social History   Tobacco Use  . Smoking status: Never Smoker  . Smokeless tobacco: Never Used  Vaping Use  . Vaping Use:  Never used  Substance Use Topics  . Alcohol use: No    Alcohol/week: 0.0 standard drinks  . Drug use: No     Medication list has been reviewed and updated.  Current Meds  Medication Sig  . aspirin 81 MG chewable tablet Chew 1 tablet by mouth daily.  Marland Kitchen atorvastatin (LIPITOR) 80 MG tablet Take 1 tablet (80 mg total) by mouth daily.  . Blood Glucose Monitoring Suppl (ONE TOUCH ULTRA SYSTEM KIT) w/Device KIT 1 kit by Does not apply route once.  . Calcium Carbonate-Vit D-Min (CALTRATE 600+D PLUS MINERALS) 600-800 MG-UNIT TABS Take 1 tablet by mouth daily.  Marland Kitchen KLOR-CON M20 20 MEQ tablet Take 1 tablet by mouth daily.  Marland Kitchen losartan (COZAAR) 100 MG tablet Take 1 tablet (100 mg total) by mouth daily.  . metFORMIN (GLUCOPHAGE) 500 MG tablet Take 2 tablets (1,000 mg total) by mouth daily.  . Multiple Vitamins tablet Take by mouth.  Laura Adkins Delica Lancets 82U MISC APPLY 1 EACH TOPICALLY DAILY. AS DIRECTED  . ONETOUCH ULTRA test strip USE TO TEST BLOOD SUGAR TWICE DAILY.  Marland Kitchen torsemide (DEMADEX) 20 MG tablet TAKE 1 TABLET BY MOUTH EVERY DAY  . triamcinolone cream (KENALOG) 0.1 % APPLY TO AFFECTED AREA TWICE A DAY (Patient taking differently: as needed.)    PHQ 2/9 Scores 11/01/2020 03/11/2020 07/14/2019 05/01/2019  PHQ - 2 Score 0 0 0 0  PHQ- 9 Score 0 0 - 0    GAD 7 : Generalized Anxiety Score 11/01/2020 03/11/2020  Nervous, Anxious, on Edge 0 0  Control/stop worrying 0 0  Worry too much - different things 0 0  Trouble relaxing 0 0  Restless 0 0  Easily annoyed or irritable 0 0  Afraid - awful might happen 0 0  Total GAD 7 Score 0 0  Anxiety Difficulty - Not difficult at all    BP Readings from Last 3 Encounters:  11/01/20 136/82  07/14/20 112/70  03/11/20 128/84    Physical Exam Vitals and nursing note reviewed.  Constitutional:      General: She is not in acute distress.    Appearance: She is well-developed.  HENT:     Head: Normocephalic and atraumatic.  Cardiovascular:      Rate and Rhythm: Normal rate and regular rhythm.     Pulses: Normal pulses.  Pulmonary:     Effort: Pulmonary effort is normal. No respiratory distress.     Breath sounds: No wheezing or rhonchi.  Musculoskeletal:     Right lower leg: No edema.     Left lower leg: No edema.  Skin:    General: Skin is warm and dry.     Findings: No rash.  Neurological:     General: No focal deficit present.     Mental Status: She is alert and oriented to person, place, and time.  Psychiatric:        Mood and Affect: Mood and affect and mood normal.     Wt Readings from Last 3 Encounters:  11/01/20 214 lb (97.1 kg)  07/14/20 208 lb (94.3 kg)  03/11/20 210 lb (95.3  kg)    BP 136/82   Pulse (!) 58   Temp 98 F (36.7 C) (Oral)   Ht 5' 3"  (1.6 m)   Wt 214 lb (97.1 kg)   SpO2 98%   BMI 37.91 kg/m   Assessment and Plan: 1. Type II diabetes mellitus with complication (HCC) Clinically stable by exam and report without s/s of hypoglycemia.  Recent weight gain discouraging but pt plans to get back on track with diet and exercise. DM complicated by HTN. Tolerating medications well without side effects or other concerns. - Hemoglobin A1c  2. Essential (primary) hypertension Blood pressure is well controlled on ARB and diuretic Continue current regimen - low salt diet and resume exercise  3. Hyperlipidemia associated with type 2 diabetes mellitus (HCC) - atorvastatin (LIPITOR) 80 MG tablet; Take 1 tablet (80 mg total) by mouth daily.  Dispense: 90 tablet; Refill: 1   Partially dictated using Editor, commissioning. Any errors are unintentional.  Halina Maidens, MD [facility]  11/01/2020

## 2020-11-02 LAB — HEMOGLOBIN A1C
Est. average glucose Bld gHb Est-mCnc: 148 mg/dL
Hgb A1c MFr Bld: 6.8 % — ABNORMAL HIGH (ref 4.8–5.6)

## 2021-01-13 ENCOUNTER — Telehealth: Payer: Self-pay

## 2021-01-13 NOTE — Telephone Encounter (Signed)
Called pt could not leave VM. Answering machine was not set up. Phone rung then had a busy signal. Pt will have to try to get in touch with the billing company who sent the bill to the patient they would have to handle it for pt.  Will route result note to [facility] Nurse Triage for follow up when patient returns call to clinic. Nurse may give results to patient if they return call. CRM created for this message.    KP

## 2021-01-13 NOTE — Telephone Encounter (Signed)
Copied from CRM [id number]. Topic: General - Other >> Jan 13, 2021 12:38 PM Wyonia Hough E wrote: Reason for CRM: pt called to speak with Dr. Jerrell Belfast about a pathologist bill/ pt has not been able to get through to that office at all after several tries / Pt stated she paid the $35 bill in January but they keep sending her a bill/ please advise

## 2021-02-02 ENCOUNTER — Other Ambulatory Visit: Payer: Self-pay | Admitting: Internal Medicine

## 2021-02-02 DIAGNOSIS — E118 Type 2 diabetes mellitus with unspecified complications: Secondary | ICD-10-CM

## 2021-02-10 ENCOUNTER — Ambulatory Visit: Payer: Self-pay

## 2021-02-10 NOTE — Telephone Encounter (Signed)
Patient called and says she's been having bug bites while sleeping and also while sitting in her chair. She says the bites are small, red and raised. She says they itch to the point that when she scratches they bleed. She says she's using hydrocortisone cream and rubbing alcohol that is not working. She denies any other symptoms. Appointment scheduled for Tuesday, 02/14/21 at 0940 with Dr. Judithann Graves, care advice given, patient verbalized understanding.    Summary: Clinical Advice   Patient is experiencing skin irration from possible insect bite, back of hand, back of neck and lower back is irritated and alcohol is not working. Offered patient appointment declined at this time and seeking clinical advice       Reason for Disposition . Diagnosis of bed bug bites is uncertain  Answer Assessment - Initial Assessment Questions 1. LOCATION: "Where are the bites located?"      Hands, neck, lower part of back 2. ONSET: "When did you get bitten?"      Today 3. CAUSE: Why do you suspect bed bugs?" (e.g., recent travel, recent purchase of used clothing)      N/A 4. REDNESS: "Is the area red or pink?" If Yes, ask: "What size is area of redness?" (inches or cm). "When did the redness start?"     Various sizes 5. ITCHING: "Does it itch?" If Yes, ask: "How bad is the itch?"    - MILD: doesn't interfere with normal activities   - MODERATE-SEVERE: interferes with work, school, sleep, or other activities      Moderate-severe 6. SWELLING: "How big is the swelling?" (inches, cm, or compare to coins)     Small area 7. OTHER SYMPTOMS: "Do you have any other symptoms?"  (e.g., difficulty breathing, hives)     No  Protocols used: BED BUG BITE-A-AH

## 2021-02-14 ENCOUNTER — Ambulatory Visit (INDEPENDENT_AMBULATORY_CARE_PROVIDER_SITE_OTHER): Payer: BC Managed Care – PPO | Admitting: Internal Medicine

## 2021-02-14 ENCOUNTER — Encounter: Payer: Self-pay | Admitting: Internal Medicine

## 2021-02-14 ENCOUNTER — Telehealth: Payer: Self-pay

## 2021-02-14 VITALS — BP 130/81 | Ht 63.0 in

## 2021-02-14 DIAGNOSIS — B888 Other specified infestations: Secondary | ICD-10-CM

## 2021-02-14 DIAGNOSIS — L299 Pruritus, unspecified: Secondary | ICD-10-CM

## 2021-02-14 MED ORDER — TRIAMCINOLONE ACETONIDE 0.1 % EX CREA: TOPICAL_CREAM | CUTANEOUS | 0 refills | Status: AC

## 2021-02-14 NOTE — Telephone Encounter (Signed)
This visit type is being conducted due to national recommendations for restrictions regarding the COVID- 19 Pandemic (e.g. social distancing) in effort to limit this patients exposure and mitigate transmission in our community. This visit type is felt to be most appropriate for this patient at this time. I connected with the patient today and received telephone consent from the patient and patient understand this consent will be good for 1 year.  KP  

## 2021-02-14 NOTE — Progress Notes (Signed)
Date:  02/14/2021   Name:  Laura Adkins   DOB:  06-19-1956   MRN:  [id number]  I connected with this patient, Laura Adkins, by telephone at the patient's home.  I verified that I am speaking with the correct person using two identifiers. This visit was conducted via telephone due to the Covid-19 outbreak from my office at [facility] in [place], [place]. I discussed the limitations, risks, security and privacy concerns of performing an evaluation and management service by telephone. I also discussed with the patient that there may be a patient responsible charge related to this service. The patient expressed understanding and agreed to proceed.  Chief Complaint: Insect Bite (X2 weeks found a nest in the bed, something is biting pt in bed, red, itchy, little brown bug) Started a while ago intermittently.  They got worse and she then found a nest near her bed.  She sprayed it and changed sheets.  She has tried drying clothes on highest temperature. She still continues to get bites during the night.  She can not afford an exterminator at this time.  She wants something for the itching - using triamcinolone cream and just started taking claritin. HPI  Lab Results  Component Value Date   CREATININE 0.87 03/11/2020   BUN 12 03/11/2020   NA 142 03/11/2020   K 4.4 03/11/2020   CL 105 03/11/2020   CO2 23 03/11/2020   Lab Results  Component Value Date   CHOL 141 03/11/2020   HDL 66 03/11/2020   LDLCALC 59 03/11/2020   TRIG 84 03/11/2020   CHOLHDL 2.1 03/11/2020   Lab Results  Component Value Date   TSH 1.750 03/11/2020   Lab Results  Component Value Date   HGBA1C 6.8 (H) 11/01/2020   Lab Results  Component Value Date   WBC 4.8 03/11/2020   HGB 11.3 03/11/2020   HCT 35.4 03/11/2020   MCV 82 03/11/2020   PLT 247 03/11/2020   Lab Results  Component Value Date   ALT 23 03/11/2020   AST 18 03/11/2020   ALKPHOS 97 03/11/2020   BILITOT 0.4 03/11/2020      Review of Systems  Constitutional: Negative for chills and fatigue.  Respiratory: Negative for chest tightness and shortness of breath.   Cardiovascular: Negative for chest pain.  Skin:       Insect bites and itching    Patient Active Problem List   Diagnosis Date Noted  . Localized edema 05/01/2019  . Hyperlipidemia associated with type 2 diabetes mellitus ([place]) 11/06/2017  . Low grade squamous intraepithelial lesion (LGSIL) on Papanicolaou smear of cervix 02/06/2016  . Type II diabetes mellitus with complication ([place]) 38/88/2800  . Coronary artery disease of native artery of native heart with stable angina pectoris ([place]) 10/10/2015  . Fibrocystic breast 05/05/2015  . Essential (primary) hypertension 05/05/2015  . Migraine without aura and responsive to treatment 05/05/2015  . OSA on CPAP 05/05/2015  . Arthritis of knee, degenerative 05/05/2015  . Posterior tibial tendinitis 05/05/2015  . LGSIL (low grade squamous intraepithelial dysplasia) 03/10/2014    Allergies  Allergen Reactions  . Ace Inhibitors Other (See Comments)  . Orange Oil Other (See Comments)    Unable to tolerate it.   . Celebrex  [Celecoxib] Rash    Past Surgical History:  Procedure Laterality Date  . BREAST SURGERY  05/1997   necrosis  . COLONOSCOPY  04/2008  . COLONOSCOPY  04/2018   normal - repeat 10 yrs  Social History   Tobacco Use  . Smoking status: Never Smoker  . Smokeless tobacco: Never Used  Vaping Use  . Vaping Use: Never used  Substance Use Topics  . Alcohol use: No    Alcohol/week: 0.0 standard drinks  . Drug use: No     Medication list has been reviewed and updated.  Current Meds  Medication Sig  . aspirin 81 MG chewable tablet Chew 1 tablet by mouth daily.  Marland Kitchen atorvastatin (LIPITOR) 80 MG tablet Take 1 tablet (80 mg total) by mouth daily.  . Blood Glucose Monitoring Suppl (ONE TOUCH ULTRA SYSTEM KIT) w/Device KIT 1 kit by Does not apply route once.  . Calcium  Carbonate-Vit D-Min (CALTRATE 600+D PLUS MINERALS) 600-800 MG-UNIT TABS Take 1 tablet by mouth daily.  Marland Kitchen KLOR-CON M20 20 MEQ tablet Take 1 tablet by mouth daily.  Marland Kitchen losartan (COZAAR) 100 MG tablet Take 1 tablet (100 mg total) by mouth daily.  . metFORMIN (GLUCOPHAGE) 500 MG tablet Take 2 tablets (1,000 mg total) by mouth daily.  . Multiple Vitamins tablet Take by mouth.  Glory Rosebush Delica Lancets 13Y MISC APPLY 1 EACH TOPICALLY DAILY. AS DIRECTED  . ONETOUCH ULTRA test strip USE TO TEST BLOOD SUGAR TWICE DAILY.  Marland Kitchen torsemide (DEMADEX) 20 MG tablet TAKE 1 TABLET BY MOUTH EVERY DAY  . triamcinolone cream (KENALOG) 0.1 % APPLY TO AFFECTED AREA TWICE A DAY (Patient taking differently: as needed.)    PHQ 2/9 Scores 02/14/2021 11/01/2020 03/11/2020 07/14/2019  PHQ - 2 Score 0 0 0 0  PHQ- 9 Score 0 0 0 -    GAD 7 : Generalized Anxiety Score 02/14/2021 11/01/2020 03/11/2020  Nervous, Anxious, on Edge 0 0 0  Control/stop worrying 0 0 0  Worry too much - different things 0 0 0  Trouble relaxing 0 0 0  Restless 0 0 0  Easily annoyed or irritable 0 0 0  Afraid - awful might happen 0 0 0  Total GAD 7 Score 0 0 0  Anxiety Difficulty - - Not difficult at all    BP Readings from Last 3 Encounters:  02/14/21 130/81  11/01/20 136/82  07/14/20 112/70    Physical Exam HENT:     Right Ear: Decreased hearing noted.     Left Ear: Decreased hearing noted.  Psychiatric:        Mood and Affect: Mood normal.        Behavior: Behavior normal.    Telephonic visit -   Wt Readings from Last 3 Encounters:  11/01/20 214 lb (97.1 kg)  07/14/20 208 lb (94.3 kg)  03/11/20 210 lb (95.3 kg)    BP 130/81   Ht 5' 3"  (1.6 m)   BMI 37.91 kg/m   Assessment and Plan: 1. Pruritic dermatitis Continue TAC and claritin daily for sx relief  2. Infestation by bed bug Continue efforts to control the infestation but will ultimately need an exterminator to treat the whole house - triamcinolone (KENALOG) 0.1 %;  APPLY TO AFFECTED AREA TWICE A DAY  Dispense: 60 g; Refill: 0  I spent 10 minutes on this encounter. Partially dictated using Editor, commissioning. Any errors are unintentional.  Halina Maidens, MD [facility]  02/14/2021

## 2021-03-03 ENCOUNTER — Other Ambulatory Visit: Payer: Self-pay | Admitting: Internal Medicine

## 2021-03-03 DIAGNOSIS — I10 Essential (primary) hypertension: Secondary | ICD-10-CM

## 2021-03-11 ENCOUNTER — Other Ambulatory Visit: Payer: Self-pay | Admitting: Internal Medicine

## 2021-03-11 DIAGNOSIS — E118 Type 2 diabetes mellitus with unspecified complications: Secondary | ICD-10-CM

## 2021-03-11 NOTE — Telephone Encounter (Signed)
Requested Prescriptions  Pending Prescriptions Disp Refills  . metFORMIN (GLUCOPHAGE) 500 MG tablet [Pharmacy Med Name: METFORMIN HCL 500 MG TABLET] 180 tablet 0    Sig: TAKE 2 TABLETS (1,000 MG TOTAL) BY MOUTH DAILY.     Endocrinology:  Diabetes - Biguanides Failed - 03/11/2021  8:58 AM      Failed - Cr in normal range and within 360 days    Creatinine, Ser  Date Value Ref Range Status  03/11/2020 0.87 0.57 - 1.00 mg/dL Final         Failed - AA eGFR in normal range and within 360 days    GFR calc Af Amer  Date Value Ref Range Status  03/11/2020 83 >59 mL/min/1.73 Final    Comment:    **[facility] currently reports eGFR in compliance with the current**   recommendations of the National Kidney Foundation. [facility] will   update reporting as new guidelines are published from the NKF-ASN   Task force.    GFR calc non Af Amer  Date Value Ref Range Status  03/11/2020 72 >59 mL/min/1.73 Final         Passed - HBA1C is between 0 and 7.9 and within 180 days    Hgb A1c MFr Bld  Date Value Ref Range Status  11/01/2020 6.8 (H) 4.8 - 5.6 % Final    Comment:             Prediabetes: 5.7 - 6.4          Diabetes: >6.4          Glycemic control for adults with diabetes: <7.0          Passed - Valid encounter within last 6 months    Recent Outpatient Visits          3 weeks ago Pruritic dermatitis   [facility] Berglund, Laura H, MD   4 months ago Type II diabetes mellitus with complication (HCC)   [facility] Berglund, Laura H, MD   8 months ago Essential (primary) hypertension   [facility] Berglund, Laura H, MD   1 year ago Annual physical exam   [facility] Berglund, Laura H, MD   1 year ago Type II diabetes mellitus with complication (HCC)   [facility] Berglund, Laura H, MD      Future Appointments            In 3 days Berglund, Laura H, MD [facility]             

## 2021-03-14 ENCOUNTER — Ambulatory Visit (INDEPENDENT_AMBULATORY_CARE_PROVIDER_SITE_OTHER): Payer: BC Managed Care – PPO | Admitting: Internal Medicine

## 2021-03-14 ENCOUNTER — Other Ambulatory Visit (HOSPITAL_COMMUNITY)
Admission: RE | Admit: 2021-03-14 | Discharge: 2021-03-14 | Disposition: A | Discharge status: Home / Self Care | Payer: BC Managed Care – PPO | Source: Ambulatory Visit | Attending: Internal Medicine | Admitting: Internal Medicine

## 2021-03-14 ENCOUNTER — Encounter: Payer: Self-pay | Admitting: Internal Medicine

## 2021-03-14 ENCOUNTER — Other Ambulatory Visit: Payer: Self-pay

## 2021-03-14 VITALS — BP 130/80 | HR 63 | Temp 98.1°F | Ht 63.0 in | Wt 211.0 lb

## 2021-03-14 DIAGNOSIS — Z0000 Encounter for general adult medical examination without abnormal findings: Secondary | ICD-10-CM

## 2021-03-14 DIAGNOSIS — I25118 Atherosclerotic heart disease of native coronary artery with other forms of angina pectoris: Secondary | ICD-10-CM

## 2021-03-14 DIAGNOSIS — E118 Type 2 diabetes mellitus with unspecified complications: Secondary | ICD-10-CM | POA: Diagnosis not present

## 2021-03-14 DIAGNOSIS — E1169 Type 2 diabetes mellitus with other specified complication: Secondary | ICD-10-CM | POA: Diagnosis not present

## 2021-03-14 DIAGNOSIS — R87612 Low grade squamous intraepithelial lesion on cytologic smear of cervix (LGSIL): Secondary | ICD-10-CM | POA: Insufficient documentation

## 2021-03-14 DIAGNOSIS — I10 Essential (primary) hypertension: Secondary | ICD-10-CM

## 2021-03-14 DIAGNOSIS — R21 Rash and other nonspecific skin eruption: Secondary | ICD-10-CM

## 2021-03-14 DIAGNOSIS — E785 Hyperlipidemia, unspecified: Secondary | ICD-10-CM

## 2021-03-14 LAB — POCT URINALYSIS DIPSTICK
Bilirubin, UA: NEGATIVE
Glucose, UA: NEGATIVE
Ketones, UA: NEGATIVE
Nitrite, UA: NEGATIVE
Protein, UA: POSITIVE — AB
Spec Grav, UA: 1.01 (ref 1.010–1.025)
Urobilinogen, UA: 0.2 E.U./dL
pH, UA: 6.5 (ref 5.0–8.0)

## 2021-03-14 MED ORDER — METFORMIN HCL 500 MG PO TABS
1000.0000 mg | ORAL_TABLET | Freq: Every day | ORAL | 3 refills | Status: DC
Start: 2021-03-14 — End: 2022-03-16

## 2021-03-14 MED ORDER — ATORVASTATIN CALCIUM 80 MG PO TABS: 80.0000 mg | ORAL_TABLET | Freq: Every day | ORAL | 3 refills | Status: DC

## 2021-03-14 MED ORDER — LOSARTAN POTASSIUM 100 MG PO TABS: 1.0000 | ORAL_TABLET | Freq: Every day | ORAL | 3 refills | Status: DC

## 2021-03-14 NOTE — Progress Notes (Signed)
Date:  03/14/2021   Name:  Laura Adkins   DOB:  November 26, 1955   MRN:  [id number]   Chief Complaint: Annual Exam (Breast exam with pap, foot exam )  Laura Adkins is a 65 y.o. female who presents today for her Complete Annual Exam. She feels well. She reports exercising none. She reports she is sleeping well. Breast complaints none.  Mammogram: 11/2020 DEXA: none Pap smear: 02/2020 ASCUS HPV negative Colonoscopy: 04/2018 repeat 2029  Immunization History  Administered Date(s) Administered  . Influenza,inj,Quad PF,6+ Mos 10/10/2015, 10/08/2016, 08/14/2017, 09/09/2018, 07/14/2019, 07/14/2020  . Moderna Sars-Covid-2 Vaccination 02/08/2020, 03/11/2020, 09/23/2020  . Pneumococcal Polysaccharide-23 06/04/2013  . Pneumococcal-Unspecified 06/04/2013  . Tdap 06/28/2014, 02/06/2016    Hypertension This is a chronic problem. The problem is controlled. Pertinent negatives include no chest pain, headaches, palpitations or shortness of breath. Past treatments include angiotensin blockers. The current treatment provides significant improvement. Hypertensive end-organ damage includes CAD/MI.  Diabetes She presents for her follow-up diabetic visit. She has type 2 diabetes mellitus. Her disease course has been stable. Pertinent negatives for hypoglycemia include no dizziness, headaches, nervousness/anxiousness or tremors. Pertinent negatives for diabetes include no chest pain, no fatigue, no polydipsia and no polyuria. Current diabetic treatment includes oral agent (monotherapy). She is compliant with treatment all of the time. An ACE inhibitor/angiotensin II receptor blocker is being taken.  Hyperlipidemia This is a chronic problem. The problem is controlled. Pertinent negatives include no chest pain or shortness of breath.    Lab Results  Component Value Date   CREATININE 0.87 03/11/2020   BUN 12 03/11/2020   NA 142 03/11/2020   K 4.4 03/11/2020   CL 105 03/11/2020   CO2 23  03/11/2020   Lab Results  Component Value Date   CHOL 141 03/11/2020   HDL 66 03/11/2020   LDLCALC 59 03/11/2020   TRIG 84 03/11/2020   CHOLHDL 2.1 03/11/2020   Lab Results  Component Value Date   TSH 1.750 03/11/2020   Lab Results  Component Value Date   HGBA1C 6.8 (H) 11/01/2020   Lab Results  Component Value Date   WBC 4.8 03/11/2020   HGB 11.3 03/11/2020   HCT 35.4 03/11/2020   MCV 82 03/11/2020   PLT 247 03/11/2020   Lab Results  Component Value Date   ALT 23 03/11/2020   AST 18 03/11/2020   ALKPHOS 97 03/11/2020   BILITOT 0.4 03/11/2020     Review of Systems  Constitutional: Negative for chills, fatigue and fever.  HENT: Positive for hearing loss. Negative for congestion, tinnitus, trouble swallowing and voice change.   Eyes: Negative for visual disturbance.  Respiratory: Negative for cough, chest tightness, shortness of breath and wheezing.   Cardiovascular: Negative for chest pain, palpitations and leg swelling.  Gastrointestinal: Negative for abdominal pain, constipation, diarrhea and vomiting.  Endocrine: Negative for polydipsia and polyuria.  Genitourinary: Negative for dysuria, frequency, genital sores, vaginal bleeding and vaginal discharge.  Musculoskeletal: Negative for arthralgias, gait problem and joint swelling.  Skin: Negative for color change and rash.  Neurological: Negative for dizziness, tremors, light-headedness and headaches.  Hematological: Negative for adenopathy. Does not bruise/bleed easily.  Psychiatric/Behavioral: Negative for dysphoric mood and sleep disturbance. The patient is not nervous/anxious.     Patient Active Problem List   Diagnosis Date Noted  . Localized edema 05/01/2019  . Hyperlipidemia associated with type 2 diabetes mellitus ([place]) 11/06/2017  . 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 Type II diabetes mellitus with complication (HCC) 10/10/2015  . Coronary  artery disease of native artery of native heart with stable angina pectoris (HCC) 10/10/2015  . Fibrocystic breast 05/05/2015  . Essential (primary) hypertension 05/05/2015  . Migraine without aura and responsive to treatment 05/05/2015  . OSA on CPAP 05/05/2015  . Arthritis of knee, degenerative 05/05/2015  . Posterior tibial tendinitis 05/05/2015    Allergies  Allergen Reactions  . Ace Inhibitors Other (See Comments)  . Orange Oil Other (See Comments)    Unable to tolerate it.   . Celebrex  [Celecoxib] Rash    Past Surgical History:  Procedure Laterality Date  . BREAST SURGERY  05/1997   necrosis  . COLONOSCOPY  04/2008  . COLONOSCOPY  04/2018   normal - repeat 10 yrs    Social History   Tobacco Use  . Smoking status: Never Smoker  . Smokeless tobacco: Never Used  Vaping Use  . Vaping Use: Never used  Substance Use Topics  . Alcohol use: No    Alcohol/week: 0.0 standard drinks  . Drug use: No     Medication list has been reviewed and updated.  Current Meds  Medication Sig  . aspirin 81 MG chewable tablet Chew 1 tablet by mouth daily.  . atorvastatin (LIPITOR) 80 MG tablet Take 1 tablet (80 mg total) by mouth daily.  . Blood Glucose Monitoring Suppl (ONE TOUCH ULTRA SYSTEM KIT) w/Device KIT 1 kit by Does not apply route once.  . Calcium Carbonate-Vit D-Min (CALTRATE 600+D PLUS MINERALS) 600-800 MG-UNIT TABS Take 1 tablet by mouth daily.  . KLOR-CON M20 20 MEQ tablet Take 1 tablet by mouth daily.  . losartan (COZAAR) 100 MG tablet TAKE 1 TABLET BY MOUTH EVERY DAY  . metFORMIN (GLUCOPHAGE) 500 MG tablet TAKE 2 TABLETS (1,000 MG TOTAL) BY MOUTH DAILY.  . Multiple Vitamins tablet Take by mouth.  . OneTouch Delica Lancets 33G MISC APPLY 1 EACH TOPICALLY DAILY. AS DIRECTED  . ONETOUCH ULTRA test strip USE TO TEST BLOOD SUGAR TWICE DAILY.  . torsemide (DEMADEX) 20 MG tablet TAKE 1 TABLET BY MOUTH EVERY DAY  . triamcinolone (KENALOG) 0.1 % APPLY TO AFFECTED AREA TWICE A  DAY    PHQ 2/9 Scores 03/14/2021 02/14/2021 11/01/2020 03/11/2020  PHQ - 2 Score 0 0 0 0  PHQ- 9 Score 0 0 0 0    GAD 7 : Generalized Anxiety Score 03/14/2021 02/14/2021 11/01/2020 03/11/2020  Nervous, Anxious, on Edge 1 0 0 0  Control/stop worrying 1 0 0 0  Worry too much - different things 1 0 0 0  Trouble relaxing 0 0 0 0  Restless 0 0 0 0  Easily annoyed or irritable 0 0 0 0  Afraid - awful might happen 0 0 0 0  Total GAD 7 Score 3 0 0 0  Anxiety Difficulty - - - Not difficult at all    BP Readings from Last 3 Encounters:  03/14/21 130/80  02/14/21 130/81  11/01/20 136/82    Physical Exam Vitals and nursing note reviewed.  Constitutional:      General: She is not in acute distress.    Appearance: She is well-developed. She is obese.  HENT:     Head: Normocephalic and atraumatic.     Right Ear: Tympanic membrane and ear canal normal.     Left Ear: Tympanic membrane and ear canal normal.     Nose:     Right Sinus: No maxillary sinus   tenderness.     Left Sinus: No maxillary sinus tenderness.  Eyes:     General: No scleral icterus.       Right eye: No discharge.        Left eye: No discharge.     Conjunctiva/sclera: Conjunctivae normal.  Neck:     Thyroid: No thyromegaly.     Vascular: No carotid bruit.  Cardiovascular:     Rate and Rhythm: Normal rate and regular rhythm.     Pulses: Normal pulses.     Heart sounds: Normal heart sounds.  Pulmonary:     Effort: Pulmonary effort is normal. No respiratory distress.     Breath sounds: No wheezing.  Chest:  Breasts:     Right: No mass, nipple discharge, skin change or tenderness.     Left: No mass, nipple discharge, skin change or tenderness.    Abdominal:     General: Bowel sounds are normal.     Palpations: Abdomen is soft.     Tenderness: There is no abdominal tenderness.  Genitourinary:    Labia:        Right: No tenderness, lesion or injury.        Left: No tenderness, lesion or injury.      Vagina:  Normal.     Cervix: Friability present. No discharge or lesion.     Uterus: Normal.      Adnexa: Right adnexa normal and left adnexa normal.     Comments: Pap obtained Musculoskeletal:     Cervical back: Normal range of motion. No erythema.     Right lower leg: No edema.     Left lower leg: No edema.  Lymphadenopathy:     Cervical: No cervical adenopathy.  Skin:    General: Skin is warm and dry.     Capillary Refill: Capillary refill takes less than 2 seconds.     Findings: Lesion present. No rash.     Comments: Multiple hyper pigmented circular lesion on upper arms and backs  Neurological:     General: No focal deficit present.     Mental Status: She is alert and oriented to person, place, and time.     Cranial Nerves: No cranial nerve deficit.     Sensory: No sensory deficit.     Deep Tendon Reflexes: Reflexes are normal and symmetric.  Psychiatric:        Attention and Perception: Attention normal.        Mood and Affect: Mood normal.        Behavior: Behavior normal.     Wt Readings from Last 3 Encounters:  03/14/21 211 lb (95.7 kg)  11/01/20 214 lb (97.1 kg)  07/14/20 208 lb (94.3 kg)    BP 130/80   Pulse 63   Temp 98.1 F (36.7 C) (Oral)   Ht 5' 3" (1.6 m)   Wt 211 lb (95.7 kg)   SpO2 100%   BMI 37.38 kg/m   Assessment and Plan: 1. Annual physical exam Exam is normal except for weight. Encourage regular exercise and appropriate dietary changes.  2. Essential (primary) hypertension Clinically stable exam with well controlled BP. Tolerating medications without side effects at this time. Pt to continue current regimen and low sodium diet; - CBC with Differential/Platelet - TSH - POCT urinalysis dipstick - losartan (COZAAR) 100 MG tablet; Take 1 tablet (100 mg total) by mouth daily.  Dispense: 90 tablet; Refill: 3  3. Type II diabetes mellitus with complication (HCC) Clinically stable by  exam and report without s/s of hypoglycemia. DM complicated by  HTN. Tolerating medications well without side effects or other concerns. - Comprehensive metabolic panel - Hemoglobin A1c - metFORMIN (GLUCOPHAGE) 500 MG tablet; Take 2 tablets (1,000 mg total) by mouth daily.  Dispense: 180 tablet; Refill: 3  4. Hyperlipidemia associated with type 2 diabetes mellitus (HCC) Tolerating statin medication without side effects at this time LDL is at goal of < 70 on current dose Continue same therapy without change at this time. - Lipid panel - atorvastatin (LIPITOR) 80 MG tablet; Take 1 tablet (80 mg total) by mouth daily.  Dispense: 90 tablet; Refill: 3  5. 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 Coronary artery disease of native artery of native heart with stable angina pectoris (HCC) On statin therapy Recommend annual cardiology follow up  7. Skin rash - Ambulatory referral to Dermatology   Partially dictated using Dragon software. Any errors are unintentional.  Laura Berglund, MD [facility]  03/14/2021     

## 2021-03-15 LAB — CBC WITH DIFFERENTIAL/PLATELET
Basophils Absolute: 0.1 10*3/uL (ref 0.0–0.2)
Basos: 1 %
EOS (ABSOLUTE): 0.2 10*3/uL (ref 0.0–0.4)
Eos: 4 %
Hematocrit: 36.5 % (ref 34.0–46.6)
Hemoglobin: 11.6 g/dL (ref 11.1–15.9)
Immature Grans (Abs): 0 10*3/uL (ref 0.0–0.1)
Immature Granulocytes: 0 %
Lymphocytes Absolute: 2.1 10*3/uL (ref 0.7–3.1)
Lymphs: 40 %
MCH: 25.3 pg — ABNORMAL LOW (ref 26.6–33.0)
MCHC: 31.8 g/dL (ref 31.5–35.7)
MCV: 80 fL (ref 79–97)
Monocytes Absolute: 0.5 10*3/uL (ref 0.1–0.9)
Monocytes: 10 %
Neutrophils Absolute: 2.3 10*3/uL (ref 1.4–7.0)
Neutrophils: 45 %
Platelets: 249 10*3/uL (ref 150–450)
RBC: 4.58 x10E6/uL (ref 3.77–5.28)
RDW: 13.4 % (ref 11.7–15.4)
WBC: 5.2 10*3/uL (ref 3.4–10.8)

## 2021-03-15 LAB — COMPREHENSIVE METABOLIC PANEL
ALT: 24 IU/L (ref 0–32)
AST: 24 IU/L (ref 0–40)
Albumin/Globulin Ratio: 1.6 (ref 1.2–2.2)
Albumin: 4.4 g/dL (ref 3.8–4.8)
Alkaline Phosphatase: 111 IU/L (ref 44–121)
BUN/Creatinine Ratio: 15 (ref 12–28)
BUN: 14 mg/dL (ref 8–27)
Bilirubin Total: 0.3 mg/dL (ref 0.0–1.2)
CO2: 26 mmol/L (ref 20–29)
Calcium: 9.7 mg/dL (ref 8.7–10.3)
Chloride: 100 mmol/L (ref 96–106)
Creatinine, Ser: 0.92 mg/dL (ref 0.57–1.00)
Globulin, Total: 2.7 g/dL (ref 1.5–4.5)
Glucose: 102 mg/dL — ABNORMAL HIGH (ref 65–99)
Potassium: 4.1 mmol/L (ref 3.5–5.2)
Sodium: 141 mmol/L (ref 134–144)
Total Protein: 7.1 g/dL (ref 6.0–8.5)
eGFR: 70 mL/min/{1.73_m2} (ref >59)

## 2021-03-15 LAB — LIPID PANEL
Chol/HDL Ratio: 2.5 ratio (ref 0.0–4.4)
Cholesterol, Total: 147 mg/dL (ref 100–199)
HDL: 59 mg/dL (ref >39)
LDL Chol Calc (NIH): 71 mg/dL (ref 0–99)
Triglycerides: 88 mg/dL (ref 0–149)
VLDL Cholesterol Cal: 17 mg/dL (ref 5–40)

## 2021-03-15 LAB — TSH: TSH: 2.48 u[IU]/mL (ref 0.450–4.500)

## 2021-03-15 LAB — HEMOGLOBIN A1C
Est. average glucose Bld gHb Est-mCnc: 154 mg/dL
Hgb A1c MFr Bld: 7 % — ABNORMAL HIGH (ref 4.8–5.6)

## 2021-03-16 LAB — CYTOLOGY - PAP
Comment: NEGATIVE
Diagnosis: NEGATIVE
High risk HPV: NEGATIVE

## 2021-03-17 ENCOUNTER — Other Ambulatory Visit: Payer: Self-pay | Admitting: Internal Medicine

## 2021-03-17 DIAGNOSIS — I10 Essential (primary) hypertension: Secondary | ICD-10-CM

## 2021-03-17 NOTE — Telephone Encounter (Signed)
Approved per protocol.  Requested Prescriptions  Pending Prescriptions Disp Refills  . torsemide (DEMADEX) 20 MG tablet [Pharmacy Med Name: TORSEMIDE 20 MG TABLET] 90 tablet 1    Sig: TAKE 1 TABLET BY MOUTH EVERY DAY     Cardiovascular:  Diuretics - Loop Passed - 03/17/2021  1:12 AM      Passed - K in normal range and within 360 days    Potassium  Date Value Ref Range Status  03/14/2021 4.1 3.5 - 5.2 mmol/L Final         Passed - Ca in normal range and within 360 days    Calcium  Date Value Ref Range Status  03/14/2021 9.7 8.7 - 10.3 mg/dL Final         Passed - Na in normal range and within 360 days    Sodium  Date Value Ref Range Status  03/14/2021 141 134 - 144 mmol/L Final         Passed - Cr in normal range and within 360 days    Creatinine, Ser  Date Value Ref Range Status  03/14/2021 0.92 0.57 - 1.00 mg/dL Final         Passed - Last BP in normal range    BP Readings from Last 1 Encounters:  03/14/21 130/80         Passed - Valid encounter within last 6 months    Recent Outpatient Visits          3 days ago Annual physical exam   [facility] Reubin Milan, MD   1 month ago Pruritic dermatitis   [facility] Reubin Milan, MD   4 months ago Type II diabetes mellitus with complication [facility])   [facility] Reubin Milan, MD   8 months ago Essential (primary) hypertension   [facility] Reubin Milan, MD   1 year ago Annual physical exam   [facility] Reubin Milan, MD      Future Appointments            In 3 months Judithann Graves Nyoka Cowden, MD [facility]   In 12 months Judithann Graves Nyoka Cowden, MD [facility], [facility]

## 2021-07-11 LAB — HM DIABETES EYE EXAM

## 2021-07-14 ENCOUNTER — Ambulatory Visit: Payer: BC Managed Care – PPO | Admitting: Internal Medicine

## 2021-07-14 ENCOUNTER — Encounter: Payer: Self-pay | Admitting: Internal Medicine

## 2021-07-14 ENCOUNTER — Other Ambulatory Visit: Payer: Self-pay

## 2021-07-14 VITALS — BP 118/82 | HR 53 | Temp 98.0°F | Ht 63.0 in | Wt 209.0 lb

## 2021-07-14 DIAGNOSIS — E118 Type 2 diabetes mellitus with unspecified complications: Secondary | ICD-10-CM | POA: Diagnosis not present

## 2021-07-14 DIAGNOSIS — I10 Essential (primary) hypertension: Secondary | ICD-10-CM

## 2021-07-14 DIAGNOSIS — Z23 Encounter for immunization: Secondary | ICD-10-CM

## 2021-07-14 LAB — POCT GLYCOSYLATED HEMOGLOBIN (HGB A1C): Hemoglobin A1C: 6.5 % — AB (ref 4.0–5.6)

## 2021-07-14 NOTE — Progress Notes (Signed)
Date:  07/14/2021   Name:  Laura Adkins   DOB:  October 15, 1956   MRN:  [id number]   Chief Complaint: Hypertension and Diabetes (Last BS 138 this morning )  Diabetes She presents for her follow-up diabetic visit. She has type 2 diabetes mellitus. Her disease course has been stable. Pertinent negatives for hypoglycemia include no headaches or tremors. Pertinent negatives for diabetes include no chest pain, no fatigue, no polydipsia and no polyuria. Symptoms are stable. Pertinent negatives for diabetic complications include no CVA. Current diabetic treatment includes oral agent (monotherapy) (metformin 1000 mg daily). Her weight is stable. She is following a generally healthy diet. Her breakfast blood glucose is taken between 6-7 am. Her breakfast blood glucose range is generally 130-140 mg/dl. An ACE inhibitor/angiotensin II receptor blocker is being taken. Eye exam is not current.  Hypertension This is a chronic problem. The problem is controlled. Pertinent negatives include no chest pain, headaches, palpitations or shortness of breath. Past treatments include angiotensin blockers and diuretics. The current treatment provides significant improvement. There are no compliance problems.  Hypertensive end-organ damage includes heart failure. There is no history of kidney disease, CAD/MI or CVA.   Lab Results  Component Value Date   CREATININE 0.92 03/14/2021   BUN 14 03/14/2021   NA 141 03/14/2021   K 4.1 03/14/2021   CL 100 03/14/2021   CO2 26 03/14/2021   Lab Results  Component Value Date   CHOL 147 03/14/2021   HDL 59 03/14/2021   LDLCALC 71 03/14/2021   TRIG 88 03/14/2021   CHOLHDL 2.5 03/14/2021   Lab Results  Component Value Date   TSH 2.480 03/14/2021   Lab Results  Component Value Date   HGBA1C 6.5 (A) 07/14/2021   Lab Results  Component Value Date   WBC 5.2 03/14/2021   HGB 11.6 03/14/2021   HCT 36.5 03/14/2021   MCV 80 03/14/2021   PLT 249 03/14/2021   Lab  Results  Component Value Date   ALT 24 03/14/2021   AST 24 03/14/2021   ALKPHOS 111 03/14/2021   BILITOT 0.3 03/14/2021     Review of Systems  Constitutional:  Negative for appetite change, fatigue, fever and unexpected weight change.  HENT:  Negative for tinnitus and trouble swallowing.   Eyes:  Negative for visual disturbance.  Respiratory:  Negative for cough, chest tightness and shortness of breath.   Cardiovascular:  Negative for chest pain, palpitations and leg swelling.  Gastrointestinal:  Negative for abdominal pain.  Endocrine: Negative for polydipsia and polyuria.  Genitourinary:  Negative for dysuria and hematuria.  Musculoskeletal:  Negative for arthralgias.  Neurological:  Negative for tremors, numbness and headaches.  Psychiatric/Behavioral:  Negative for dysphoric mood.    Patient Active Problem List   Diagnosis Date Noted   Localized edema 05/01/2019   Hyperlipidemia associated with type 2 diabetes mellitus ([place]) 11/06/2017   Low grade squamous intraepithelial lesion (LGSIL) on Papanicolaou smear of cervix 02/06/2016   Type II diabetes mellitus with complication ([place]) 94/32/7614   Coronary artery disease of native artery of native heart with stable angina pectoris ([place]) 10/10/2015   Fibrocystic breast 05/05/2015   Essential (primary) hypertension 05/05/2015   Migraine without aura and responsive to treatment 05/05/2015   OSA on CPAP 05/05/2015   Arthritis of knee, degenerative 05/05/2015   Posterior tibial tendinitis 05/05/2015    Allergies  Allergen Reactions   Ace Inhibitors Other (See Comments)   Orange Oil Other (See Comments)  Unable to tolerate it.    Celebrex  [Celecoxib] Rash    Past Surgical History:  Procedure Laterality Date   BREAST SURGERY  05/1997   necrosis   COLONOSCOPY  04/2008   COLONOSCOPY  04/2018   normal - repeat 10 yrs    Social History   Tobacco Use   Smoking status: Never   Smokeless tobacco: Never  Vaping Use    Vaping Use: Never used  Substance Use Topics   Alcohol use: No    Alcohol/week: 0.0 standard drinks   Drug use: No     Medication list has been reviewed and updated.  Current Meds  Medication Sig   aspirin 81 MG chewable tablet Chew 1 tablet by mouth daily.   atorvastatin (LIPITOR) 80 MG tablet Take 1 tablet (80 mg total) by mouth daily.   Blood Glucose Monitoring Suppl (ONE TOUCH ULTRA SYSTEM KIT) w/Device KIT 1 kit by Does not apply route once.   Calcium Carbonate-Vit D-Min (CALTRATE 600+D PLUS MINERALS) 600-800 MG-UNIT TABS Take 1 tablet by mouth daily.   KLOR-CON M20 20 MEQ tablet Take 1 tablet by mouth daily.   losartan (COZAAR) 100 MG tablet Take 1 tablet (100 mg total) by mouth daily.   metFORMIN (GLUCOPHAGE) 500 MG tablet Take 2 tablets (1,000 mg total) by mouth daily.   Multiple Vitamins tablet Take by mouth.   OneTouch Delica Lancets 16L MISC APPLY 1 EACH TOPICALLY DAILY. AS DIRECTED   ONETOUCH ULTRA test strip USE TO TEST BLOOD SUGAR TWICE DAILY.   torsemide (DEMADEX) 20 MG tablet TAKE 1 TABLET BY MOUTH EVERY DAY    PHQ 2/9 Scores 07/14/2021 03/14/2021 02/14/2021 11/01/2020  PHQ - 2 Score 0 0 0 0  PHQ- 9 Score 1 0 0 0    GAD 7 : Generalized Anxiety Score 07/14/2021 03/14/2021 02/14/2021 11/01/2020  Nervous, Anxious, on Edge 0 1 0 0  Control/stop worrying 0 1 0 0  Worry too much - different things 1 1 0 0  Trouble relaxing 0 0 0 0  Restless 0 0 0 0  Easily annoyed or irritable 0 0 0 0  Afraid - awful might happen 1 0 0 0  Total GAD 7 Score 2 3 0 0  Anxiety Difficulty - - - -    BP Readings from Last 3 Encounters:  07/14/21 118/82  03/14/21 130/80  02/14/21 130/81    Physical Exam Vitals and nursing note reviewed.  Constitutional:      General: She is not in acute distress.    Appearance: Normal appearance. She is well-developed.  HENT:     Head: Normocephalic and atraumatic.  Cardiovascular:     Rate and Rhythm: Normal rate and regular rhythm.     Pulses:  Normal pulses.     Heart sounds: No murmur heard. Pulmonary:     Effort: Pulmonary effort is normal. No respiratory distress.     Breath sounds: No wheezing or rhonchi.  Musculoskeletal:     Cervical back: Normal range of motion.     Right lower leg: No edema.     Left lower leg: No edema.  Lymphadenopathy:     Cervical: No cervical adenopathy.  Skin:    General: Skin is warm and dry.     Capillary Refill: Capillary refill takes less than 2 seconds.     Findings: No rash.  Neurological:     General: No focal deficit present.     Mental Status: She is alert and oriented to  person, place, and time.  Psychiatric:        Mood and Affect: Mood normal.        Behavior: Behavior normal.    Wt Readings from Last 3 Encounters:  07/14/21 209 lb (94.8 kg)  03/14/21 211 lb (95.7 kg)  11/01/20 214 lb (97.1 kg)    BP 118/82   Pulse (!) 53   Temp 98 F (36.7 C) (Oral)   Ht 5' 3" (1.6 m)   Wt 209 lb (94.8 kg)   SpO2 100%   BMI 37.02 kg/m   Assessment and Plan: 1. Type II diabetes mellitus with complication (HCC) Clinically stable by exam and report without s/s of hypoglycemia. A1C improved. DM complicated by hypertension and dyslipidemia. Tolerating medications well without side effects or other concerns. Continue exercise and diet as well. - POCT glycosylated hemoglobin (Hb A1C) = 6.5 down from 7.0  2. Essential (primary) hypertension Clinically stable exam with well controlled BP. Tolerating medications without side effects at this time. Pt to continue current regimen and low sodium diet; benefits of regular exercise as able discussed.  3. Need for shingles vaccine First dose today - Varicella-zoster vaccine IM   Partially dictated using Editor, commissioning. Any errors are unintentional.  Halina Maidens, MD [facility]  07/14/2021

## 2021-08-14 ENCOUNTER — Ambulatory Visit (INDEPENDENT_AMBULATORY_CARE_PROVIDER_SITE_OTHER): Payer: BC Managed Care – PPO

## 2021-08-14 DIAGNOSIS — Z23 Encounter for immunization: Secondary | ICD-10-CM | POA: Diagnosis not present

## 2021-09-14 ENCOUNTER — Other Ambulatory Visit: Payer: Self-pay | Admitting: Internal Medicine

## 2021-09-14 DIAGNOSIS — I10 Essential (primary) hypertension: Secondary | ICD-10-CM

## 2021-09-14 NOTE — Telephone Encounter (Signed)
Requested Prescriptions  Pending Prescriptions Disp Refills  . torsemide (DEMADEX) 20 MG tablet [Pharmacy Med Name: TORSEMIDE 20 MG TABLET] 90 tablet 1    Sig: TAKE 1 TABLET BY MOUTH EVERY DAY     Cardiovascular:  Diuretics - Loop Passed - 09/14/2021  1:13 AM      Passed - K in normal range and within 360 days    Potassium  Date Value Ref Range Status  03/14/2021 4.1 3.5 - 5.2 mmol/L Final         Passed - Ca in normal range and within 360 days    Calcium  Date Value Ref Range Status  03/14/2021 9.7 8.7 - 10.3 mg/dL Final         Passed - Na in normal range and within 360 days    Sodium  Date Value Ref Range Status  03/14/2021 141 134 - 144 mmol/L Final         Passed - Cr in normal range and within 360 days    Creatinine, Ser  Date Value Ref Range Status  03/14/2021 0.92 0.57 - 1.00 mg/dL Final         Passed - Last BP in normal range    BP Readings from Last 1 Encounters:  07/14/21 118/82         Passed - Valid encounter within last 6 months    Recent Outpatient Visits          2 months ago Type II diabetes mellitus with complication [facility])   [facility] Reubin Milan, MD   6 months ago Annual physical exam   [facility] Reubin Milan, MD   7 months ago Pruritic dermatitis   [facility] Reubin Milan, MD   10 months ago Type II diabetes mellitus with complication [facility])   [facility] Reubin Milan, MD   1 year ago Essential (primary) hypertension   [facility] Reubin Milan, MD      Future Appointments            In 2 months Judithann Graves Nyoka Cowden, MD [facility]   In 6 months Judithann Graves, Nyoka Cowden, MD [facility]

## 2021-11-14 ENCOUNTER — Ambulatory Visit: Payer: BC Managed Care – PPO | Admitting: Internal Medicine

## 2021-11-14 ENCOUNTER — Other Ambulatory Visit: Payer: Self-pay

## 2021-11-14 ENCOUNTER — Encounter: Payer: Self-pay | Admitting: Internal Medicine

## 2021-11-14 ENCOUNTER — Other Ambulatory Visit: Payer: Self-pay | Admitting: Internal Medicine

## 2021-11-14 VITALS — BP 126/82 | HR 76 | Ht 63.0 in | Wt 205.0 lb

## 2021-11-14 DIAGNOSIS — E118 Type 2 diabetes mellitus with unspecified complications: Secondary | ICD-10-CM | POA: Diagnosis not present

## 2021-11-14 DIAGNOSIS — I10 Essential (primary) hypertension: Secondary | ICD-10-CM | POA: Diagnosis not present

## 2021-11-14 DIAGNOSIS — Z23 Encounter for immunization: Secondary | ICD-10-CM

## 2021-11-14 LAB — POCT GLYCOSYLATED HEMOGLOBIN (HGB A1C): Hemoglobin A1C: 6.3 % — AB (ref 4.0–5.6)

## 2021-11-14 NOTE — Progress Notes (Signed)
Date:  11/14/2021   Name:  Laura Adkins   DOB:  Mar 12, 1956   MRN:  [id number]   Chief Complaint: Diabetes (Last BS 128 )  Diabetes She presents for her follow-up diabetic visit. She has type 2 diabetes mellitus. Pertinent negatives for hypoglycemia include no headaches or tremors. Pertinent negatives for diabetes include no chest pain, no fatigue, no polydipsia and no polyuria. Current diabetic treatment includes oral agent (monotherapy) (metformin). An ACE inhibitor/angiotensin II receptor blocker is being taken. Eye exam is not current.  Hypertension This is a chronic problem. The problem is controlled. Pertinent negatives include no chest pain, headaches, palpitations or shortness of breath.   Lab Results  Component Value Date   NA 141 03/14/2021   K 4.1 03/14/2021   CO2 26 03/14/2021   GLUCOSE 102 (H) 03/14/2021   BUN 14 03/14/2021   CREATININE 0.92 03/14/2021   CALCIUM 9.7 03/14/2021   EGFR 70 03/14/2021   GFRNONAA 72 03/11/2020   Lab Results  Component Value Date   CHOL 147 03/14/2021   HDL 59 03/14/2021   LDLCALC 71 03/14/2021   TRIG 88 03/14/2021   CHOLHDL 2.5 03/14/2021   Lab Results  Component Value Date   TSH 2.480 03/14/2021   Lab Results  Component Value Date   HGBA1C 6.3 (A) 11/14/2021   Lab Results  Component Value Date   WBC 5.2 03/14/2021   HGB 11.6 03/14/2021   HCT 36.5 03/14/2021   MCV 80 03/14/2021   PLT 249 03/14/2021   Lab Results  Component Value Date   ALT 24 03/14/2021   AST 24 03/14/2021   ALKPHOS 111 03/14/2021   BILITOT 0.3 03/14/2021   No results found for: 25OHVITD2, 25OHVITD3, VD25OH   Review of Systems  Constitutional:  Negative for appetite change, fatigue, fever and unexpected weight change.  HENT:  Negative for tinnitus and trouble swallowing.   Eyes:  Negative for visual disturbance.  Respiratory:  Negative for cough, chest tightness and shortness of breath.   Cardiovascular:  Negative for chest pain,  palpitations and leg swelling.  Gastrointestinal:  Negative for abdominal pain.  Endocrine: Negative for polydipsia and polyuria.  Genitourinary:  Negative for dysuria and hematuria.  Musculoskeletal:  Negative for arthralgias.  Neurological:  Negative for tremors, numbness and headaches.  Psychiatric/Behavioral:  Negative for dysphoric mood.    Patient Active Problem List   Diagnosis Date Noted   Localized edema 05/01/2019   Hyperlipidemia associated with type 2 diabetes mellitus ([place]) 11/06/2017   Low grade squamous intraepithelial lesion (LGSIL) on Papanicolaou smear of cervix 02/06/2016   Type II diabetes mellitus with complication ([place]) 50/07/3817   Coronary artery disease of native artery of native heart with stable angina pectoris ([place]) 10/10/2015   Fibrocystic breast 05/05/2015   Essential (primary) hypertension 05/05/2015   Migraine without aura and responsive to treatment 05/05/2015   OSA on CPAP 05/05/2015   Arthritis of knee, degenerative 05/05/2015   Posterior tibial tendinitis 05/05/2015    Allergies  Allergen Reactions   Ace Inhibitors Other (See Comments)   Orange Oil Other (See Comments)    Unable to tolerate it.    Celebrex  [Celecoxib] Rash    Past Surgical History:  Procedure Laterality Date   BREAST SURGERY  05/1997   necrosis   COLONOSCOPY  04/2008   COLONOSCOPY  04/2018   normal - repeat 10 yrs    Social History   Tobacco Use   Smoking status: Never   Smokeless  tobacco: Never  Vaping Use   Vaping Use: Never used  Substance Use Topics   Alcohol use: No    Alcohol/week: 0.0 standard drinks   Drug use: No     Medication list has been reviewed and updated.  Current Meds  Medication Sig   aspirin 81 MG chewable tablet Chew 1 tablet by mouth daily.   atorvastatin (LIPITOR) 80 MG tablet Take 1 tablet (80 mg total) by mouth daily.   Blood Glucose Monitoring Suppl (ONE TOUCH ULTRA SYSTEM KIT) w/Device KIT 1 kit by Does not apply route once.    Calcium Carbonate-Vit D-Min (CALTRATE 600+D PLUS MINERALS) 600-800 MG-UNIT TABS Take 1 tablet by mouth daily.   KLOR-CON M20 20 MEQ tablet Take 1 tablet by mouth daily.   losartan (COZAAR) 100 MG tablet Take 1 tablet (100 mg total) by mouth daily.   metFORMIN (GLUCOPHAGE) 500 MG tablet Take 2 tablets (1,000 mg total) by mouth daily.   Multiple Vitamins tablet Take by mouth.   OneTouch Delica Lancets 32K MISC APPLY 1 EACH TOPICALLY DAILY. AS DIRECTED   ONETOUCH ULTRA test strip USE TO TEST BLOOD SUGAR TWICE DAILY.   torsemide (DEMADEX) 20 MG tablet TAKE 1 TABLET BY MOUTH EVERY DAY   triamcinolone (KENALOG) 0.1 % APPLY TO AFFECTED AREA TWICE A DAY (Patient taking differently: as needed.)    PHQ 2/9 Scores 11/14/2021 07/14/2021 03/14/2021 02/14/2021  PHQ - 2 Score 0 0 0 0  PHQ- 9 Score 1 1 0 0    GAD 7 : Generalized Anxiety Score 11/14/2021 07/14/2021 03/14/2021 02/14/2021  Nervous, Anxious, on Edge 0 0 1 0  Control/stop worrying 0 0 1 0  Worry too much - different things 0 1 1 0  Trouble relaxing 0 0 0 0  Restless 0 0 0 0  Easily annoyed or irritable 0 0 0 0  Afraid - awful might happen 0 1 0 0  Total GAD 7 Score 0 2 3 0  Anxiety Difficulty - - - -    BP Readings from Last 3 Encounters:  11/14/21 126/82  07/14/21 118/82  03/14/21 130/80    Physical Exam Vitals and nursing note reviewed.  Constitutional:      General: She is not in acute distress.    Appearance: Normal appearance. She is well-developed.  HENT:     Head: Normocephalic and atraumatic.  Neck:     Vascular: No carotid bruit.  Cardiovascular:     Rate and Rhythm: Normal rate and regular rhythm.     Pulses: Normal pulses.     Heart sounds: No murmur heard. Pulmonary:     Effort: Pulmonary effort is normal. No respiratory distress.     Breath sounds: No wheezing or rhonchi.  Musculoskeletal:     Cervical back: Normal range of motion.     Right lower leg: No edema.     Left lower leg: No edema.  Lymphadenopathy:      Cervical: No cervical adenopathy.  Skin:    General: Skin is warm and dry.     Findings: No rash.  Neurological:     General: No focal deficit present.     Mental Status: She is alert and oriented to person, place, and time.  Psychiatric:        Mood and Affect: Mood normal.        Behavior: Behavior normal.    Wt Readings from Last 3 Encounters:  11/14/21 205 lb (93 kg)  07/14/21 209 lb (94.8 kg)  03/14/21 211 lb (95.7 kg)    BP 126/82    Pulse 76    Ht 5' 3"  (1.6 m)    Wt 205 lb (93 kg)    SpO2 97%    BMI 36.31 kg/m   Assessment and Plan: 1. Type II diabetes mellitus with complication (HCC) Clinically stable by exam and report without s/s of hypoglycemia. DM complicated by hypertension and dyslipidemia. Tolerating medications well without side effects or other concerns. - POCT glycosylated hemoglobin (Hb A1C)= 6.3 down from 6.5  2. Essential (primary) hypertension Clinically stable exam with well controlled BP. Tolerating medications without side effects at this time. Pt to continue current regimen and low sodium diet; benefits of regular exercise as able discussed.  3. Need for shingles vaccine Second dose today   Partially dictated using Editor, commissioning. Any errors are unintentional.  Halina Maidens, MD [facility]  11/14/2021

## 2021-11-15 LAB — MICROALBUMIN / CREATININE URINE RATIO
Creatinine, Urine: 176.5 mg/dL
Microalb/Creat Ratio: 4 mg/g creat (ref 0–29)
Microalbumin, Urine: 7.2 ug/mL

## 2021-11-16 ENCOUNTER — Encounter: Payer: Self-pay | Admitting: Internal Medicine

## 2022-03-16 ENCOUNTER — Ambulatory Visit (INDEPENDENT_AMBULATORY_CARE_PROVIDER_SITE_OTHER): Payer: BC Managed Care – PPO | Admitting: Internal Medicine

## 2022-03-16 ENCOUNTER — Encounter: Payer: Self-pay | Admitting: Internal Medicine

## 2022-03-16 VITALS — BP 126/78 | HR 55 | Ht 63.0 in | Wt 206.0 lb

## 2022-03-16 DIAGNOSIS — I10 Essential (primary) hypertension: Secondary | ICD-10-CM

## 2022-03-16 DIAGNOSIS — E118 Type 2 diabetes mellitus with unspecified complications: Secondary | ICD-10-CM

## 2022-03-16 DIAGNOSIS — Z0000 Encounter for general adult medical examination without abnormal findings: Secondary | ICD-10-CM

## 2022-03-16 DIAGNOSIS — I25118 Atherosclerotic heart disease of native coronary artery with other forms of angina pectoris: Secondary | ICD-10-CM | POA: Diagnosis not present

## 2022-03-16 DIAGNOSIS — Z23 Encounter for immunization: Secondary | ICD-10-CM

## 2022-03-16 DIAGNOSIS — E1169 Type 2 diabetes mellitus with other specified complication: Secondary | ICD-10-CM | POA: Diagnosis not present

## 2022-03-16 DIAGNOSIS — E785 Hyperlipidemia, unspecified: Secondary | ICD-10-CM

## 2022-03-16 LAB — POCT URINALYSIS DIPSTICK
Bilirubin, UA: NEGATIVE
Blood, UA: NEGATIVE
Glucose, UA: NEGATIVE
Ketones, UA: NEGATIVE
Nitrite, UA: NEGATIVE
Protein, UA: POSITIVE — AB
Spec Grav, UA: 1.01 (ref 1.010–1.025)
Urobilinogen, UA: 0.2 E.U./dL
pH, UA: 6 (ref 5.0–8.0)

## 2022-03-16 MED ORDER — TORSEMIDE 20 MG PO TABS: 20.0000 mg | ORAL_TABLET | Freq: Every day | ORAL | 3 refills | Status: DC

## 2022-03-16 MED ORDER — ATORVASTATIN CALCIUM 80 MG PO TABS: 80.0000 mg | ORAL_TABLET | Freq: Every day | ORAL | 3 refills | Status: DC

## 2022-03-16 MED ORDER — LOSARTAN POTASSIUM 100 MG PO TABS: 100.0000 mg | ORAL_TABLET | Freq: Every day | ORAL | 3 refills | Status: DC

## 2022-03-16 MED ORDER — METFORMIN HCL 500 MG PO TABS: 1000.0000 mg | ORAL_TABLET | Freq: Every day | ORAL | 3 refills | Status: DC

## 2022-03-16 NOTE — Progress Notes (Signed)
? ? ?Date:  03/16/2022  ? ?Name:  Laura Adkins   DOB:  [birth year]   MRN:  [id number] ? ? ?Chief Complaint: Annual Exam (Breast exam no pao) ?Laura Adkins is a 66 y.o. female who presents today for her Complete Annual Exam. She feels well. She reports exercising walking daily. She reports she is sleeping well. Breast complaints none. ? ?Mammogram: 12/2021 ?DEXA: none ?Pap smear: 02/2021 neg thin prep ?Colonoscopy: 04/2018 repeat 2029 ? ?There are no preventive care reminders to display for this patient. ?  ?Immunization History  ?Administered Date(s) Administered  ? Influenza,inj,Quad PF,6+ Mos 10/10/2015, 10/08/2016, 08/14/2017, 09/09/2018, 07/14/2019, 07/14/2020, 08/14/2021  ? Moderna Sars-Covid-2 Vaccination 02/08/2020, 03/11/2020, 09/23/2020  ? Pneumococcal Polysaccharide-23 06/04/2013  ? Pneumococcal-Unspecified 06/04/2013  ? Tdap 06/28/2014, 02/06/2016  ? Zoster Recombinat (Shingrix) 07/14/2021, 11/14/2021  ? ? ?Hypertension ?This is a chronic problem. The problem is controlled. Pertinent negatives include no chest pain, headaches, palpitations or shortness of breath. There are no associated agents to hypertension. Past treatments include angiotensin blockers and diuretics. The current treatment provides significant improvement. Hypertensive end-organ damage includes CAD/MI. There is no history of kidney disease or CVA.  ?Hyperlipidemia ?This is a chronic problem. The problem is controlled. Pertinent negatives include no chest pain or shortness of breath. Current antihyperlipidemic treatment includes statins. The current treatment provides significant improvement of lipids.  ?Diabetes ?She presents for her follow-up diabetic visit. She has type 2 diabetes mellitus. Pertinent negatives for hypoglycemia include no dizziness, headaches, nervousness/anxiousness or tremors. Pertinent negatives for diabetes include no chest pain, no fatigue, no polydipsia and no polyuria. Pertinent negatives for  diabetic complications include no CVA. Current diabetic treatment includes oral agent (monotherapy) (metformin). She participates in exercise intermittently. There is no change in her home blood glucose trend. Her breakfast blood glucose is taken between 6-7 am. Her breakfast blood glucose range is generally 110-130 mg/dl. An ACE inhibitor/angiotensin II receptor blocker is being taken. Eye exam is current.  ? ?Lab Results  ?Component Value Date  ? NA 141 03/14/2021  ? K 4.1 03/14/2021  ? CO2 26 03/14/2021  ? GLUCOSE 102 (H) 03/14/2021  ? BUN 14 03/14/2021  ? CREATININE 0.92 03/14/2021  ? CALCIUM 9.7 03/14/2021  ? EGFR 70 03/14/2021  ? GFRNONAA 72 03/11/2020  ? ?Lab Results  ?Component Value Date  ? CHOL 147 03/14/2021  ? HDL 59 03/14/2021  ? [place]ly 71 03/14/2021  ? TRIG 88 03/14/2021  ? CHOLHDL 2.5 03/14/2021  ? ?Lab Results  ?Component Value Date  ? TSH 2.480 03/14/2021  ? ?Lab Results  ?Component Value Date  ? HGBA1C 6.3 (A) 11/14/2021  ? ?Lab Results  ?Component Value Date  ? WBC 5.2 03/14/2021  ? HGB 11.6 03/14/2021  ? HCT 36.5 03/14/2021  ? MCV 80 03/14/2021  ? PLT 249 03/14/2021  ? ?Lab Results  ?Component Value Date  ? ALT 24 03/14/2021  ? AST 24 03/14/2021  ? ALKPHOS 111 03/14/2021  ? BILITOT 0.3 03/14/2021  ? ?No results found for: 25OHVITD2, [place], VD25OH  ? ?Review of Systems  ?Constitutional:  Negative for chills, fatigue and fever.  ?HENT:  Positive for hearing loss. Negative for congestion, tinnitus, trouble swallowing and voice change.   ?Eyes:  Negative for visual disturbance.  ?Respiratory:  Negative for cough, chest tightness, shortness of breath and wheezing.   ?Cardiovascular:  Negative for chest pain, palpitations and leg swelling.  ?Gastrointestinal:  Negative for abdominal pain, constipation, diarrhea and vomiting.  ?Endocrine: Negative for  polydipsia and polyuria.  ?Genitourinary:  Negative for dysuria, frequency, genital sores, vaginal bleeding and vaginal discharge.  ?Musculoskeletal:   Negative for arthralgias, gait problem and joint swelling.  ?Skin:  Negative for color change and rash.  ?Neurological:  Negative for dizziness, tremors, light-headedness and headaches.  ?Hematological:  Negative for adenopathy. Does not bruise/bleed easily.  ?Psychiatric/Behavioral:  Negative for dysphoric mood and sleep disturbance. The patient is not nervous/anxious.   ? ?Patient Active Problem List  ? Diagnosis Date Noted  ? Localized edema 05/01/2019  ? Hyperlipidemia associated with type 2 diabetes mellitus ([place]) 11/06/2017  ? Low grade squamous intraepithelial lesion (LGSIL) on Papanicolaou smear of cervix 02/06/2016  ? Type II diabetes mellitus with complication ([place]) 41/93/7902  ? Coronary artery disease of native artery of native heart with stable angina pectoris ([place]) 10/10/2015  ? Fibrocystic breast 05/05/2015  ? Essential (primary) hypertension 05/05/2015  ? Migraine without aura and responsive to treatment 05/05/2015  ? OSA on CPAP 05/05/2015  ? Arthritis of knee, degenerative 05/05/2015  ? Posterior tibial tendinitis 05/05/2015  ? ? ?Allergies  ?Allergen Reactions  ? Ace Inhibitors Other (See Comments)  ? Orange Oil Other (See Comments)  ?  Unable to tolerate it.   ? Celebrex  [Celecoxib] Rash  ? ? ?Past Surgical History:  ?Procedure Laterality Date  ? BREAST SURGERY  05/1997  ? necrosis  ? COLONOSCOPY  04/2008  ? COLONOSCOPY  04/2018  ? normal - repeat 10 yrs  ? ? ?Social History  ? ?Tobacco Use  ? Smoking status: Never  ? Smokeless tobacco: Never  ?Vaping Use  ? Vaping Use: Never used  ?Substance Use Topics  ? Alcohol use: No  ?  Alcohol/week: 0.0 standard drinks  ? Drug use: No  ? ? ? ?Medication list has been reviewed and updated. ? ?Current Meds  ?Medication Sig  ? aspirin 81 MG chewable tablet Chew 1 tablet by mouth daily.  ? atorvastatin (LIPITOR) 80 MG tablet Take 1 tablet (80 mg total) by mouth daily.  ? Blood Glucose Monitoring Suppl (ONE TOUCH ULTRA SYSTEM KIT) w/Device KIT 1 kit by Does  not apply route once.  ? Calcium Carbonate-Vit D-Min (CALTRATE 600+D PLUS MINERALS) 600-800 MG-UNIT TABS Take 1 tablet by mouth daily.  ? KLOR-CON M20 20 MEQ tablet Take 1 tablet by mouth daily.  ? losartan (COZAAR) 100 MG tablet Take 1 tablet (100 mg total) by mouth daily.  ? metFORMIN (GLUCOPHAGE) 500 MG tablet Take 2 tablets (1,000 mg total) by mouth daily.  ? Multiple Vitamins tablet Take by mouth.  ? OneTouch Delica Lancets 40X MISC APPLY 1 EACH TOPICALLY DAILY. AS DIRECTED  ? ONETOUCH ULTRA test strip USE TO TEST BLOOD SUGAR TWICE DAILY.  ? torsemide (DEMADEX) 20 MG tablet TAKE 1 TABLET BY MOUTH EVERY DAY  ? triamcinolone (KENALOG) 0.1 % APPLY TO AFFECTED AREA TWICE A DAY (Patient taking differently: as needed.)  ? ? ? ?  03/16/2022  ?  9:42 AM 11/14/2021  ?  1:49 PM 07/14/2021  ? 10:12 AM 03/14/2021  ?  9:45 AM  ?GAD 7 : Generalized Anxiety Score  ?Nervous, Anxious, on Edge 0 0 0 1  ?Control/stop worrying 0 0 0 1  ?Worry too much - different things 0 0 1 1  ?Trouble relaxing 0 0 0 0  ?Restless 0 0 0 0  ?Easily annoyed or irritable 0 0 0 0  ?Afraid - awful might happen 0 0 1 0  ?Total GAD 7 Score  0 0 2 3  ? ? ? ?  03/16/2022  ?  9:42 AM  ?Depression screen PHQ 2/9  ?Decreased Interest 0  ?Down, Depressed, Hopeless 0  ?PHQ - 2 Score 0  ?Altered sleeping 0  ?Tired, decreased energy 0  ?Change in appetite 0  ?Feeling bad or failure about yourself  0  ?Trouble concentrating 0  ?Moving slowly or fidgety/restless 0  ?Suicidal thoughts 0  ?PHQ-9 Score 0  ?Difficult doing work/chores Not difficult at all  ? ? ?BP Readings from Last 3 Encounters:  ?03/16/22 126/78  ?11/14/21 126/82  ?07/14/21 118/82  ? ? ?Physical Exam ?Vitals and nursing note reviewed.  ?Constitutional:   ?   General: She is not in acute distress. ?   Appearance: She is well-developed.  ?HENT:  ?   Head: Normocephalic and atraumatic.  ?   Nose:  ?   Right Sinus: No maxillary sinus tenderness.  ?   Left Sinus: No maxillary sinus tenderness.  ?    Mouth/Throat:  ?   Lips: No lesions.  ?   Mouth: Mucous membranes are moist.  ?   Pharynx: No oropharyngeal exudate or posterior oropharyngeal erythema.  ?Eyes:  ?   General: No scleral icterus.    ?   Right eye: No di

## 2022-03-17 LAB — TSH: TSH: 2.2 u[IU]/mL (ref 0.450–4.500)

## 2022-03-17 LAB — COMPREHENSIVE METABOLIC PANEL
ALT: 27 IU/L (ref 0–32)
AST: 25 IU/L (ref 0–40)
Albumin/Globulin Ratio: 1.6 (ref 1.2–2.2)
Albumin: 4.4 g/dL (ref 3.8–4.8)
Alkaline Phosphatase: 104 IU/L (ref 44–121)
BUN/Creatinine Ratio: 17 (ref 12–28)
BUN: 16 mg/dL (ref 8–27)
Bilirubin Total: 0.4 mg/dL (ref 0.0–1.2)
CO2: 25 mmol/L (ref 20–29)
Calcium: 9.6 mg/dL (ref 8.7–10.3)
Chloride: 103 mmol/L (ref 96–106)
Creatinine, Ser: 0.95 mg/dL (ref 0.57–1.00)
Globulin, Total: 2.8 g/dL (ref 1.5–4.5)
Glucose: 112 mg/dL — ABNORMAL HIGH (ref 70–99)
Potassium: 4.4 mmol/L (ref 3.5–5.2)
Sodium: 142 mmol/L (ref 134–144)
Total Protein: 7.2 g/dL (ref 6.0–8.5)
eGFR: 67 mL/min/{1.73_m2} (ref >59)

## 2022-03-17 LAB — CBC WITH DIFFERENTIAL/PLATELET
Basophils Absolute: 0.1 10*3/uL (ref 0.0–0.2)
Basos: 1 %
EOS (ABSOLUTE): 0.1 10*3/uL (ref 0.0–0.4)
Eos: 3 %
Hematocrit: 35.1 % (ref 34.0–46.6)
Hemoglobin: 11.5 g/dL (ref 11.1–15.9)
Immature Grans (Abs): 0 10*3/uL (ref 0.0–0.1)
Immature Granulocytes: 0 %
Lymphocytes Absolute: 2.2 10*3/uL (ref 0.7–3.1)
Lymphs: 45 %
MCH: 26.2 pg — ABNORMAL LOW (ref 26.6–33.0)
MCHC: 32.8 g/dL (ref 31.5–35.7)
MCV: 80 fL (ref 79–97)
Monocytes Absolute: 0.4 10*3/uL (ref 0.1–0.9)
Monocytes: 7 %
Neutrophils Absolute: 2.2 10*3/uL (ref 1.4–7.0)
Neutrophils: 44 %
Platelets: 252 10*3/uL (ref 150–450)
RBC: 4.39 x10E6/uL (ref 3.77–5.28)
RDW: 13.7 % (ref 11.7–15.4)
WBC: 4.9 10*3/uL (ref 3.4–10.8)

## 2022-03-17 LAB — LIPID PANEL
Chol/HDL Ratio: 2.2 ratio (ref 0.0–4.4)
Cholesterol, Total: 136 mg/dL (ref 100–199)
HDL: 62 mg/dL (ref >39)
LDL Chol Calc (NIH): 57 mg/dL (ref 0–99)
Triglycerides: 87 mg/dL (ref 0–149)
VLDL Cholesterol Cal: 17 mg/dL (ref 5–40)

## 2022-03-17 LAB — HEMOGLOBIN A1C
Est. average glucose Bld gHb Est-mCnc: 143 mg/dL
Hgb A1c MFr Bld: 6.6 % — ABNORMAL HIGH (ref 4.8–5.6)

## 2022-07-19 ENCOUNTER — Ambulatory Visit: Payer: BC Managed Care – PPO | Admitting: Internal Medicine

## 2022-07-19 ENCOUNTER — Encounter: Payer: Self-pay | Admitting: Internal Medicine

## 2022-07-19 VITALS — BP 114/60 | HR 51 | Ht 63.0 in | Wt 208.8 lb

## 2022-07-19 DIAGNOSIS — E118 Type 2 diabetes mellitus with unspecified complications: Secondary | ICD-10-CM

## 2022-07-19 DIAGNOSIS — Z23 Encounter for immunization: Secondary | ICD-10-CM

## 2022-07-19 DIAGNOSIS — B354 Tinea corporis: Secondary | ICD-10-CM

## 2022-07-19 MED ORDER — NYSTATIN 100000 UNIT/GM EX CREA: TOPICAL_CREAM | CUTANEOUS | 0 refills | Status: DC

## 2022-07-19 NOTE — Progress Notes (Signed)
Date:  07/19/2022   Name:  Laura Adkins   DOB:  [birth year]   MRN:  [id number]   Chief Complaint: Diabetes and Hypertension  Diabetes She presents for her follow-up diabetic visit. She has type 2 diabetes mellitus. Her disease course has been stable. Pertinent negatives for hypoglycemia include no headaches or tremors. Pertinent negatives for diabetes include no chest pain, no fatigue, no polydipsia and no polyuria. Symptoms are stable. Current diabetic treatment includes oral agent (monotherapy) (metformin). Her breakfast blood glucose is taken between 6-7 am. Her breakfast blood glucose range is generally 130-140 mg/dl.  Rash This is a recurrent problem. The current episode started more than 1 month ago. The problem has been waxing and waning since onset. The affected locations include the torso (under left breast). The rash is characterized by redness, burning and itchiness. She was exposed to nothing. Pertinent negatives include no cough, fatigue, fever or shortness of breath. Past treatments include cold compress. The treatment provided mild relief.    Lab Results  Component Value Date   NA 142 03/16/2022   K 4.4 03/16/2022   CO2 25 03/16/2022   GLUCOSE 112 (H) 03/16/2022   BUN 16 03/16/2022   CREATININE 0.95 03/16/2022   CALCIUM 9.6 03/16/2022   EGFR 67 03/16/2022   GFRNONAA 72 03/11/2020   Lab Results  Component Value Date   CHOL 136 03/16/2022   HDL 62 03/16/2022   LDLCALC 57 03/16/2022   TRIG 87 03/16/2022   CHOLHDL 2.2 03/16/2022   Lab Results  Component Value Date   TSH 2.200 03/16/2022   Lab Results  Component Value Date   HGBA1C 6.6 (H) 03/16/2022   Lab Results  Component Value Date   WBC 4.9 03/16/2022   HGB 11.5 03/16/2022   HCT 35.1 03/16/2022   MCV 80 03/16/2022   PLT 252 03/16/2022   Lab Results  Component Value Date   ALT 27 03/16/2022   AST 25 03/16/2022   ALKPHOS 104 03/16/2022   BILITOT 0.4 03/16/2022   No results found for:  "25OHVITD2", "25OHVITD3", "VD25OH"   Review of Systems  Constitutional:  Negative for appetite change, fatigue, fever and unexpected weight change.  HENT:  Negative for tinnitus and trouble swallowing.   Eyes:  Negative for visual disturbance.  Respiratory:  Negative for cough, chest tightness and shortness of breath.   Cardiovascular:  Negative for chest pain, palpitations and leg swelling.  Gastrointestinal:  Negative for abdominal pain.  Endocrine: Negative for polydipsia and polyuria.  Genitourinary:  Negative for dysuria and hematuria.  Musculoskeletal:  Negative for arthralgias.  Skin:  Positive for rash.  Neurological:  Negative for tremors, numbness and headaches.  Psychiatric/Behavioral:  Negative for dysphoric mood.     Patient Active Problem List   Diagnosis Date Noted   Localized edema 05/01/2019   Hyperlipidemia associated with type 2 diabetes mellitus ([place]) 11/06/2017   Low grade squamous intraepithelial lesion (LGSIL) on Papanicolaou smear of cervix 02/06/2016   Type II diabetes mellitus with complication ([place]) 29/19/1660   Coronary artery disease of native artery of native heart with stable angina pectoris ([place]) 10/10/2015   Fibrocystic breast 05/05/2015   Essential (primary) hypertension 05/05/2015   Migraine without aura and responsive to treatment 05/05/2015   OSA on CPAP 05/05/2015   Arthritis of knee, degenerative 05/05/2015   Posterior tibial tendinitis 05/05/2015    Allergies  Allergen Reactions   Ace Inhibitors Other (See Comments)   Orange Oil Other (See Comments)  Unable to tolerate it.    Celebrex  [Celecoxib] Rash    Past Surgical History:  Procedure Laterality Date   BREAST SURGERY  05/1997   necrosis   COLONOSCOPY  04/2008   COLONOSCOPY  04/2018   normal - repeat 10 yrs    Social History   Tobacco Use   Smoking status: Never   Smokeless tobacco: Never  Vaping Use   Vaping Use: Never used  Substance Use Topics   Alcohol use: No     Alcohol/week: 0.0 standard drinks of alcohol   Drug use: No     Medication list has been reviewed and updated.  Current Meds  Medication Sig   aspirin 81 MG chewable tablet Chew 1 tablet by mouth daily.   atorvastatin (LIPITOR) 80 MG tablet Take 1 tablet (80 mg total) by mouth daily.   Blood Glucose Monitoring Suppl (ONE TOUCH ULTRA SYSTEM KIT) w/Device KIT 1 kit by Does not apply route once.   Calcium Carbonate-Vit D-Min (CALTRATE 600+D PLUS MINERALS) 600-800 MG-UNIT TABS Take 1 tablet by mouth daily.   KLOR-CON M20 20 MEQ tablet Take 1 tablet by mouth daily.   losartan (COZAAR) 100 MG tablet Take 1 tablet (100 mg total) by mouth daily.   metFORMIN (GLUCOPHAGE) 500 MG tablet Take 2 tablets (1,000 mg total) by mouth daily.   Multiple Vitamins tablet Take by mouth.   OneTouch Delica Lancets 13Y MISC APPLY 1 EACH TOPICALLY DAILY. AS DIRECTED   ONETOUCH ULTRA test strip USE TO TEST BLOOD SUGAR TWICE DAILY.   torsemide (DEMADEX) 20 MG tablet Take 1 tablet (20 mg total) by mouth daily.       07/19/2022    9:30 AM 03/16/2022    9:42 AM 11/14/2021    1:49 PM 07/14/2021   10:12 AM  GAD 7 : Generalized Anxiety Score  Nervous, Anxious, on Edge 0 0 0 0  Control/stop worrying 0 0 0 0  Worry too much - different things 0 0 0 1  Trouble relaxing 0 0 0 0  Restless 0 0 0 0  Easily annoyed or irritable 0 0 0 0  Afraid - awful might happen 0 0 0 1  Total GAD 7 Score 0 0 0 2  Anxiety Difficulty Not difficult at all          07/19/2022    9:30 AM 03/16/2022    9:42 AM 11/14/2021    1:49 PM  Depression screen PHQ 2/9  Decreased Interest 0 0 0  Down, Depressed, Hopeless 0 0 0  PHQ - 2 Score 0 0 0  Altered sleeping 0 0 0  Tired, decreased energy 0 0 1  Change in appetite 0 0 0  Feeling bad or failure about yourself  0 0 0  Trouble concentrating 0 0 0  Moving slowly or fidgety/restless 0 0 0  Suicidal thoughts 0 0 0  PHQ-9 Score 0 0 1  Difficult doing work/chores Not difficult at all Not  difficult at all Not difficult at all    BP Readings from Last 3 Encounters:  07/19/22 130/84  03/16/22 126/78  11/14/21 126/82    Physical Exam Vitals and nursing note reviewed.  Constitutional:      General: She is not in acute distress.    Appearance: She is well-developed.  HENT:     Head: Normocephalic and atraumatic.  Cardiovascular:     Rate and Rhythm: Normal rate and regular rhythm.     Heart sounds: Normal heart sounds.  Pulmonary:     Effort: Pulmonary effort is normal. No respiratory distress.     Breath sounds: Normal breath sounds and air entry. No wheezing.  Skin:    General: Skin is warm and dry.     Findings: Erythema present. No rash.     Comments: Under left breast c/w tinea  Neurological:     Mental Status: She is alert and oriented to person, place, and time.  Psychiatric:        Mood and Affect: Mood normal.        Behavior: Behavior normal.     Wt Readings from Last 3 Encounters:  07/19/22 208 lb 12.8 oz (94.7 kg)  03/16/22 206 lb (93.4 kg)  11/14/21 205 lb (93 kg)    BP 130/84   Pulse (!) 51   Ht 5' 3"  (1.6 m)   Wt 208 lb 12.8 oz (94.7 kg)   SpO2 95%   BMI 36.99 kg/m   Assessment and Plan: 1. Type II diabetes mellitus with complication (HCC) Clinically stable by exam and report without s/s of hypoglycemia. DM complicated by hypertension and dyslipidemia. Tolerating medications well without side effects or other concerns. Eye exam scheduled for October - Hemoglobin A1c  2. Tinea corporis Keep area dry Use nystatin as needed - nystatin cream (MYCOSTATIN); APPLY 1 APPLICATION DAILY to rash under breast  Dispense: 30 g; Refill: 0  3. Need for immunization against influenza - Flu Vaccine QUAD 71moIM (Fluarix, Fluzone & Alfiuria Quad PF)   Partially dictated using DEditor, commissioning Any errors are unintentional.  LHalina Maidens MD [facility]  07/19/2022

## 2022-07-20 LAB — HEMOGLOBIN A1C
Est. average glucose Bld gHb Est-mCnc: 151 mg/dL
Hgb A1c MFr Bld: 6.9 % — ABNORMAL HIGH (ref 4.8–5.6)

## 2022-08-10 ENCOUNTER — Other Ambulatory Visit: Payer: Self-pay | Admitting: Internal Medicine

## 2022-08-10 DIAGNOSIS — E118 Type 2 diabetes mellitus with unspecified complications: Secondary | ICD-10-CM

## 2022-08-13 NOTE — Telephone Encounter (Signed)
Requested Prescriptions  Pending Prescriptions Disp Refills  . Lancets (ONETOUCH DELICA PLUS [id number]) [place] [Pharmacy Med Name: ONE TOUCH DELICA PLUS [id number] LANC] 599 each 3    Sig: APPLY 1 EACH TOPICALLY DAILY. AS DIRECTED     Endocrinology: Diabetes - Testing Supplies Passed - 08/10/2022  6:53 PM      Passed - Valid encounter within last 12 months    Recent Outpatient Visits          3 weeks ago Type II diabetes mellitus with complication [facility]   [place] [facility] at [facility], Jesse Sans, MD   5 months ago Annual physical exam   [place] [facility] at [facility], Jesse Sans, MD   9 months ago Type II diabetes mellitus with complication [facility])   [place] [facility] at [facility], Jesse Sans, MD   1 year ago Type II diabetes mellitus with complication [facility])   [place] [facility] at [facility], Jesse Sans, MD   1 year ago Annual physical exam   [facility] at [facility], Jesse Sans, MD      Future Appointments            In 3 months [facility] Melia, Jesse Sans, MD [facility] at [facility], [facility]   In 7 months Army Melia, Jesse Sans, MD [place] [facility] at [facility], [facility]

## 2022-08-16 DIAGNOSIS — I5030 Unspecified diastolic (congestive) heart failure: Secondary | ICD-10-CM | POA: Insufficient documentation

## 2022-08-21 ENCOUNTER — Encounter: Payer: Self-pay | Admitting: Internal Medicine

## 2022-08-21 ENCOUNTER — Ambulatory Visit: Payer: BC Managed Care – PPO | Admitting: Internal Medicine

## 2022-08-21 VITALS — BP 120/70 | HR 78 | Ht 63.0 in | Wt 202.4 lb

## 2022-08-21 DIAGNOSIS — I10 Essential (primary) hypertension: Secondary | ICD-10-CM | POA: Diagnosis not present

## 2022-08-21 DIAGNOSIS — M792 Neuralgia and neuritis, unspecified: Secondary | ICD-10-CM

## 2022-08-21 LAB — HM DIABETES EYE EXAM

## 2022-08-21 MED ORDER — GABAPENTIN 100 MG PO CAPS: 100.0000 mg | ORAL_CAPSULE | Freq: Three times a day (TID) | ORAL | 0 refills | Status: DC

## 2022-08-21 MED ORDER — PREDNISONE 10 MG PO TABS: 10.0000 mg | ORAL_TABLET | ORAL | 0 refills | Status: AC

## 2022-08-21 NOTE — Progress Notes (Signed)
Date:  08/21/2022   Name:  Laura Adkins   DOB:  [birth year]   MRN:  [id number]   Chief Complaint: Leg Pain ER follow up for leg pain.  08/13/22 at [facility]. Summary: X-rays unremarkable. Ultrasound without DVT. Patient feeling slightly improved. Will discharge home. Advise she continue Tylenol and trial Flexeril. She will follow-up with PCP. Strict return precautions provided. Patient amenable to discharge.  Leg Pain  There was no injury mechanism. The pain is present in the right leg. The quality of the pain is described as aching, burning and cramping. The pain is moderate. The pain has been Fluctuating since onset. Associated symptoms include numbness and tingling. She reports no foreign bodies present. Treatments tried: flexeril. The treatment provided no relief.    Lab Results  Component Value Date   NA 142 03/16/2022   K 4.4 03/16/2022   CO2 25 03/16/2022   GLUCOSE 112 (H) 03/16/2022   BUN 16 03/16/2022   CREATININE 0.95 03/16/2022   CALCIUM 9.6 03/16/2022   EGFR 67 03/16/2022   GFRNONAA 72 03/11/2020   Lab Results  Component Value Date   CHOL 136 03/16/2022   HDL 62 03/16/2022   LDLCALC 57 03/16/2022   TRIG 87 03/16/2022   CHOLHDL 2.2 03/16/2022   Lab Results  Component Value Date   TSH 2.200 03/16/2022   Lab Results  Component Value Date   HGBA1C 6.9 (H) 07/19/2022   Lab Results  Component Value Date   WBC 4.9 03/16/2022   HGB 11.5 03/16/2022   HCT 35.1 03/16/2022   MCV 80 03/16/2022   PLT 252 03/16/2022   Lab Results  Component Value Date   ALT 27 03/16/2022   AST 25 03/16/2022   ALKPHOS 104 03/16/2022   BILITOT 0.4 03/16/2022   No results found for: "25OHVITD2", "25OHVITD3", "VD25OH"   Review of Systems  Constitutional:  Negative for chills, fatigue and fever.  Respiratory:  Negative for chest tightness and shortness of breath.   Cardiovascular:  Negative for chest pain.  Musculoskeletal:  Positive for arthralgias and myalgias.   Neurological:  Positive for tingling, weakness and numbness.    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    Celebrex  [Celecoxib] Rash    Past Surgical History:  Procedure Laterality Date   BREAST SURGERY  05/1997   necrosis   COLONOSCOPY  04/2008   COLONOSCOPY  04/2018   normal - repeat 10 yrs    Social History   Tobacco Use   Smoking status: Never   Smokeless tobacco: Never  Vaping Use   Vaping Use: Never used  Substance Use Topics   Alcohol use: No    Alcohol/week: 0.0 standard drinks of alcohol   Drug use: No  Medication list has been reviewed and updated.  Current Meds  Medication Sig   amLODipine (NORVASC) 5 MG tablet Take 5 mg by mouth daily.   aspirin 81 MG chewable tablet Chew 1 tablet by mouth daily.   atorvastatin (LIPITOR) 80 MG tablet Take 1 tablet (80 mg total) by mouth daily.   Blood Glucose Monitoring Suppl (ONE TOUCH ULTRA SYSTEM KIT) w/Device KIT 1 kit by Does not apply route once.   Calcium Carbonate-Vit D-Min (CALTRATE 600+D PLUS MINERALS) 600-800 MG-UNIT TABS Take 1 tablet by mouth daily.   cyclobenzaprine (FLEXERIL) 5 MG tablet Take 5 mg by mouth 3 (three) times daily as needed.    KLOR-CON M20 20 MEQ tablet Take 1 tablet by mouth daily.   Lancets (ONETOUCH DELICA PLUS [id number]) MISC APPLY 1 EACH TOPICALLY DAILY. AS DIRECTED   losartan (COZAAR) 100 MG tablet Take 1 tablet (100 mg total) by mouth daily.   metFORMIN (GLUCOPHAGE) 500 MG tablet Take 2 tablets (1,000 mg total) by mouth daily.   Multiple Vitamins tablet Take by mouth.   nystatin cream (MYCOSTATIN) APPLY 1 APPLICATION DAILY to rash under breast   ONETOUCH ULTRA test strip USE TO TEST BLOOD SUGAR TWICE DAILY.   torsemide (DEMADEX) 20 MG tablet Take 1 tablet (20 mg total) by mouth daily.   triamcinolone (KENALOG) 0.1 % APPLY TO AFFECTED AREA TWICE A DAY (Patient taking differently: as needed.)       07/19/2022    9:30 AM 03/16/2022    9:42 AM 11/14/2021    1:49 PM 07/14/2021   10:12 AM  GAD 7 : Generalized Anxiety Score  Nervous, Anxious, on Edge 0 0 0 0  Control/stop worrying 0 0 0 0  Worry too much - different things 0 0 0 1  Trouble relaxing 0 0 0 0  Restless 0 0 0 0  Easily annoyed or irritable 0 0 0 0  Afraid - awful might happen 0 0 0 1  Total GAD 7 Score 0 0 0 2  Anxiety Difficulty Not difficult at all          07/19/2022    9:30 AM 03/16/2022    9:42 AM 11/14/2021    1:49 PM  Depression screen PHQ 2/9  Decreased Interest 0 0 0  Down, Depressed, Hopeless 0 0 0  PHQ - 2 Score 0 0 0  Altered sleeping 0 0 0  Tired, decreased energy 0 0 1  Change in appetite 0 0 0  Feeling bad or failure about yourself  0 0 0  Trouble concentrating 0 0 0  Moving slowly or fidgety/restless 0 0 0  Suicidal thoughts 0 0 0  PHQ-9 Score 0 0 1  Difficult doing work/chores Not difficult at all Not difficult at all Not difficult at all    BP Readings from Last 3 Encounters:  08/21/22 120/70  07/19/22 114/60  03/16/22 126/78    Physical Exam Vitals and nursing note reviewed.  Constitutional:      General: She is not in acute distress.    Appearance: Normal appearance. She is well-developed.  HENT:      Head: Normocephalic and atraumatic.  Cardiovascular:     Rate and Rhythm: Normal rate and regular rhythm.  Pulmonary:     Effort: Pulmonary effort is normal. No respiratory distress.     Breath sounds: No wheezing or rhonchi.  Musculoskeletal:     Lumbar back: No tenderness. Negative right straight leg raise test and negative left straight leg raise  test.     Right hip: No tenderness, bony tenderness or crepitus. Normal range of motion. Normal strength.  Skin:    General: Skin is warm and dry.     Capillary Refill: Capillary refill takes less than 2 seconds.     Findings: No rash.  Neurological:     General: No focal deficit present.     Mental Status: She is alert and oriented to person, place, and time.     Sensory: Sensation is intact.     Motor: Motor function is intact.     Deep Tendon Reflexes:     Reflex Scores:      Patellar reflexes are 3+ on the right side and 3+ on the left side.      Achilles reflexes are 2+ on the right side and 2+ on the left side. Psychiatric:        Mood and Affect: Mood normal.        Behavior: Behavior normal.     Wt Readings from Last 3 Encounters:  08/21/22 202 lb 6.4 oz (91.8 kg)  07/19/22 208 lb 12.8 oz (94.7 kg)  03/16/22 206 lb (93.4 kg)    BP 120/70   Pulse 78   Ht 5' 3" (1.6 m)   Wt 202 lb 6.4 oz (91.8 kg)   SpO2 98%   BMI 35.85 kg/m   Assessment and Plan: 1. Neuropathic pain, leg, right Suspect this is lumbar in origin but with no red flag symptoms. Imaging of hip and lower leg normal. Will treat presumptively with steroids and gabapentin - follow up is no improvement - gabapentin (NEURONTIN) 100 MG capsule; Take 1 capsule (100 mg total) by mouth 3 (three) times daily.  Dispense: 90 capsule; Refill: 0 - predniSONE (DELTASONE) 10 MG tablet; Take 1 tablet (10 mg total) by mouth as directed for 6 days. Take 6,5,4,3,2,1 then stop  Dispense: 21 tablet; Refill: 0  2. Essential (primary) hypertension Clinically stable exam with  well controlled BP. Tolerating medications without side effects at this time. Cardiology has added amlodipine 5 mg. Pt to continue current regimen and low sodium diet; benefits of regular exercise as able discussed.    Partially dictated using Editor, commissioning. Any errors are unintentional.  Halina Maidens, MD [facility]  08/21/2022

## 2022-11-22 ENCOUNTER — Ambulatory Visit: Payer: Medicare Other | Admitting: Internal Medicine

## 2022-11-22 ENCOUNTER — Encounter: Payer: Self-pay | Admitting: Internal Medicine

## 2022-11-22 VITALS — BP 102/60 | HR 87 | Ht 63.0 in | Wt 199.0 lb

## 2022-11-22 DIAGNOSIS — E1142 Type 2 diabetes mellitus with diabetic polyneuropathy: Secondary | ICD-10-CM | POA: Diagnosis not present

## 2022-11-22 DIAGNOSIS — I25118 Atherosclerotic heart disease of native coronary artery with other forms of angina pectoris: Secondary | ICD-10-CM

## 2022-11-22 DIAGNOSIS — E118 Type 2 diabetes mellitus with unspecified complications: Secondary | ICD-10-CM

## 2022-11-22 DIAGNOSIS — I5030 Unspecified diastolic (congestive) heart failure: Secondary | ICD-10-CM | POA: Diagnosis not present

## 2022-11-22 DIAGNOSIS — I10 Essential (primary) hypertension: Secondary | ICD-10-CM | POA: Diagnosis not present

## 2022-11-22 LAB — POCT GLYCOSYLATED HEMOGLOBIN (HGB A1C): Hemoglobin A1C: 6.7 % — AB (ref 4.0–5.6)

## 2022-11-22 NOTE — Assessment & Plan Note (Addendum)
Clinically stable exam with well controlled BP on losartan and amlodipine which was added in September Tolerating medications without side effects at this time. Pt to continue current regimen and low sodium diet; benefits of regular exercise as able discussed.

## 2022-11-22 NOTE — Progress Notes (Signed)
Date:  11/22/2022   Name:  Laura Adkins   DOB:  Sep 09, 1956   MRN:  [id number]   Chief Complaint: Hypertension and Diabetes  Diabetes She presents for her follow-up diabetic visit. She has type 2 diabetes mellitus. Her disease course has been stable. Pertinent negatives for hypoglycemia include no dizziness or headaches. Pertinent negatives for diabetes include no chest pain, no fatigue and no weakness. Current diabetic treatment includes oral agent (monotherapy) (metformin).  Hypertension This is a chronic problem. The problem is controlled. Pertinent negatives include no chest pain, headaches, palpitations or shortness of breath. Past treatments include calcium channel blockers and angiotensin blockers.    Lab Results  Component Value Date   NA 142 03/16/2022   K 4.4 03/16/2022   CO2 25 03/16/2022   GLUCOSE 112 (H) 03/16/2022   BUN 16 03/16/2022   CREATININE 0.95 03/16/2022   CALCIUM 9.6 03/16/2022   EGFR 67 03/16/2022   GFRNONAA 72 03/11/2020   Lab Results  Component Value Date   CHOL 136 03/16/2022   HDL 62 03/16/2022   LDLCALC 57 03/16/2022   TRIG 87 03/16/2022   CHOLHDL 2.2 03/16/2022   Lab Results  Component Value Date   TSH 2.200 03/16/2022   Lab Results  Component Value Date   HGBA1C 6.7 (A) 11/22/2022   Lab Results  Component Value Date   WBC 4.9 03/16/2022   HGB 11.5 03/16/2022   HCT 35.1 03/16/2022   MCV 80 03/16/2022   PLT 252 03/16/2022   Lab Results  Component Value Date   ALT 27 03/16/2022   AST 25 03/16/2022   ALKPHOS 104 03/16/2022   BILITOT 0.4 03/16/2022   No results found for: "25OHVITD2", "25OHVITD3", "VD25OH"   Review of Systems  Constitutional:  Negative for fatigue and unexpected weight change.  HENT:  Negative for nosebleeds.   Eyes:  Negative for visual disturbance.  Respiratory:  Negative for cough, chest tightness, shortness of breath and wheezing.   Cardiovascular:  Negative for chest pain, palpitations and leg  swelling.  Gastrointestinal:  Negative for abdominal pain, constipation and diarrhea.  Musculoskeletal:  Positive for arthralgias (right foot pain improved).  Neurological:  Negative for dizziness, weakness, light-headedness and headaches.    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    Celebrex  [Celecoxib] Rash    Past Surgical History:  Procedure Laterality Date   BREAST SURGERY  05/1997   necrosis   COLONOSCOPY  04/2008   COLONOSCOPY  04/2018   normal - repeat 10 yrs    Social History   Tobacco Use   Smoking status: Never  Smokeless tobacco: Never  Vaping Use   Vaping Use: Never used  Substance Use Topics   Alcohol use: No    Alcohol/week: 0.0 standard drinks of alcohol   Drug use: No     Medication list has been reviewed and updated.  Current Meds  Medication Sig   amLODipine (NORVASC) 5 MG tablet Take 5 mg by mouth daily.   aspirin 81 MG chewable tablet Chew 1 tablet by mouth daily.   atorvastatin (LIPITOR) 80 MG tablet Take 1 tablet (80 mg total) by  mouth daily.   Blood Glucose Monitoring Suppl (ONE TOUCH ULTRA SYSTEM KIT) w/Device KIT 1 kit by Does not apply route once.   Calcium Carbonate-Vit D-Min (CALTRATE 600+D PLUS MINERALS) 600-800 MG-UNIT TABS Take 1 tablet by mouth daily.   KLOR-CON M20 20 MEQ tablet Take 1 tablet by mouth daily.   Lancets (ONETOUCH DELICA PLUS [id number]) MISC APPLY 1 EACH TOPICALLY DAILY. AS DIRECTED   losartan (COZAAR) 100 MG tablet Take 1 tablet (100 mg total) by mouth daily.   metFORMIN (GLUCOPHAGE) 500 MG tablet Take 2 tablets (1,000 mg total) by mouth daily.   Multiple Vitamins tablet Take by mouth.   nystatin cream (MYCOSTATIN) APPLY 1 APPLICATION DAILY to rash under breast   ONETOUCH ULTRA test strip USE TO TEST BLOOD SUGAR TWICE DAILY.   torsemide (DEMADEX) 20 MG tablet Take 1 tablet (20 mg total) by mouth daily.   triamcinolone (KENALOG) 0.1 % APPLY TO AFFECTED AREA TWICE A DAY (Patient taking differently: as needed.)       11/22/2022    9:33 AM 08/21/2022    2:17 PM 07/19/2022    9:30 AM 03/16/2022    9:42 AM  GAD 7 : Generalized Anxiety Score  Nervous, Anxious, on Edge 0 0 0 0  Control/stop worrying 0 0 0 0  Worry too much - different things 1 0 0 0  Trouble relaxing 0 0 0 0  Restless 0 0 0 0  Easily annoyed or irritable 1 0 0 0  Afraid - awful might happen 0 0 0 0  Total GAD 7 Score 2 0 0 0  Anxiety Difficulty Not difficult at all Not difficult at all Not difficult at all        11/22/2022    9:33 AM 08/21/2022    2:17 PM 07/19/2022    9:30 AM  Depression screen PHQ 2/9  Decreased Interest 0 0 0  Down, Depressed, Hopeless 1 0 0  PHQ - 2 Score 1 0 0  Altered sleeping 0 0 0  Tired, decreased energy 0 0 0  Change in appetite 0 0 0  Feeling bad or failure about yourself  0 0 0  Trouble concentrating 0 0 0  Moving slowly or fidgety/restless 0 0 0  Suicidal thoughts 0 0 0  PHQ-9 Score 1 0 0  Difficult doing work/chores Not difficult at all Not difficult at all Not difficult at all    BP  Readings from Last 3 Encounters:  11/22/22 102/60  08/21/22 120/70  07/19/22 114/60    Physical Exam Vitals and nursing note reviewed.  Constitutional:      General: She is not in acute distress.    Appearance: She is well-developed.  HENT:     Head: Normocephalic and atraumatic.  Cardiovascular:     Rate and Rhythm: Normal rate and regular rhythm.  Pulmonary:     Effort: Pulmonary effort is normal. No respiratory distress.     Breath sounds:  No wheezing or rhonchi.  Musculoskeletal:     Cervical back: Normal range of motion.     Right lower leg: No edema.     Left lower leg: No edema.  Skin:    General: Skin is warm and dry.     Capillary Refill: Capillary refill takes less than 2 seconds.     Findings: No rash.  Neurological:     General: No focal deficit present.     Mental Status: She is alert and oriented to person, place, and time.  Psychiatric:        Mood and Affect: Mood normal.        Behavior: Behavior normal.     Wt Readings from Last 3 Encounters:  11/22/22 199 lb (90.3 kg)  08/21/22 202 lb 6.4 oz (91.8 kg)  07/19/22 208 lb 12.8 oz (94.7 kg)    BP 102/60   Pulse 87   Ht _0  (1.6 m)   Wt 199 lb (90.3 kg)   SpO2 99%   BMI 35.25 kg/m   Assessment and Plan: Problem List Items Addressed This Visit       Cardiovascular and Mediastinum   (HFpEF) heart failure with preserved ejection fraction (HCC)    Stable without angina, shortness of breath or PND Continue current regimen      Coronary artery disease of native artery of native heart with stable angina pectoris (HCC) (Chronic)    Stable with improved BP since adding amlodipine Cardiology follow up current      Essential (primary) hypertension (Chronic)    Clinically stable exam with well controlled BP on losartan and amlodipine which was added in September Tolerating medications without side effects at this time. Pt to continue current regimen and low sodium diet; benefits of regular  exercise as able discussed.         Endocrine   Diabetic polyneuropathy associated with type 2 diabetes mellitus ([place])    Had foot pain on the right last October Took gabapentin tid for one month with improvement Symptoms have not worsened since - she will continue to use tylenol as needed Will continue to monitor      Type II diabetes mellitus with complication (HCC) - Primary (Chronic)    Clinically stable by exam and report without s/s of hypoglycemia.  Avg AM BS 211 DM complicated by hypertension and dyslipidemia. Tolerating medications well without side effects - continue metformin 500 mg daily A1C 6.7 down from 6.9      Relevant Orders   POCT glycosylated hemoglobin (Hb A1C) (Completed)   Microalbumin / creatinine urine ratio     Partially dictated using Editor, commissioning. Any errors are unintentional.  Halina Maidens, MD [facility]  11/22/2022

## 2022-11-22 NOTE — Assessment & Plan Note (Signed)
Stable without angina, shortness of breath or PND Continue current regimen

## 2022-11-22 NOTE — Assessment & Plan Note (Signed)
Had foot pain on the right last October Took gabapentin tid for one month with improvement Symptoms have not worsened since - she will continue to use tylenol as needed Will continue to monitor

## 2022-11-22 NOTE — Assessment & Plan Note (Addendum)
Clinically stable by exam and report without s/s of hypoglycemia.  Avg AM BS 732 DM complicated by hypertension and dyslipidemia. Tolerating medications well without side effects - continue metformin 500 mg daily A1C 6.7 down from 6.9

## 2022-11-22 NOTE — Patient Instructions (Signed)
Call [facility] to schedule your Mammogram at [phone].  

## 2022-11-22 NOTE — Assessment & Plan Note (Signed)
Stable with improved BP since adding amlodipine Cardiology follow up current

## 2022-11-23 LAB — MICROALBUMIN / CREATININE URINE RATIO
Creatinine, Urine: 251.1 mg/dL
Microalb/Creat Ratio: 34 mg/g creat — ABNORMAL HIGH (ref 0–29)
Microalbumin, Urine: 86.4 ug/mL

## 2022-11-23 LAB — SPECIMEN STATUS REPORT

## 2022-12-11 ENCOUNTER — Telehealth: Payer: Self-pay | Admitting: Internal Medicine

## 2022-12-11 NOTE — Telephone Encounter (Signed)
Copied from [place] [phone]. Topic: General - Inquiry >> Dec 11, 2022  2:23 PM Marcellus Scott wrote: Reason for CRM: Pt stated she wanted to leave a message for Dr.Berglund. Pt stated that since it has been a while since she has used her sleep machine, she is in need of an Rx from PCP stated she needs a face mask.  Stated was advised her Rx expired in 2018. Stated she would call back to provide the name of the medical supply store where she wanted this to be sent too.  Please advise.

## 2023-01-14 ENCOUNTER — Other Ambulatory Visit: Payer: Self-pay | Admitting: Internal Medicine

## 2023-01-14 NOTE — Telephone Encounter (Signed)
Medication Refill - Medication: ONETOUCH ULTRA test strip   Has the patient contacted their pharmacy? Yes.   No, more refills.   (Agent: If yes, when and what did the pharmacy advise?)  Preferred Pharmacy (with phone number or street name):  [id number] [id number] Phone: [phone]: [phone] Hours: Not open 24 hours   Has the patient been seen for an appointment in the last year OR does the patient have an upcoming appointment? Yes.    Agent: Please be advised that RX refills may take up to 3 business days. We ask that you follow-up with your pharmacy.

## 2023-01-15 MED ORDER — ONETOUCH ULTRA VI STRP: ORAL_STRIP | 6 refills | Status: DC

## 2023-01-15 NOTE — Telephone Encounter (Signed)
Requested medication (s) are due for refill today -expired Rx  Requested medication (s) are on the active medication list -yes  Future visit scheduled -yes  Last refill: 10/19/20 #100 12RF  Notes to clinic: expired Rx-supplies  Requested Prescriptions  Pending Prescriptions Disp Refills   glucose blood (ONETOUCH ULTRA) test strip 100 strip 12    Sig: Use as instructed     Endocrinology: Diabetes - Testing Supplies Passed - 01/14/2023  9:53 AM      Passed - Valid encounter within last 12 months    Recent Outpatient Visits           1 month ago Type II diabetes mellitus with complication [facility])   [place] [facility] at [facility], Jesse Sans, MD   4 months ago Neuropathic pain, leg, right   [place] at [facility], Jesse Sans, MD   6 months ago Type II diabetes mellitus with complication [facility])   [place] at [facility], Jesse Sans, MD   10 months ago Annual physical exam   [facility] at [facility], Jesse Sans, MD   1 year ago Type II diabetes mellitus with complication [facility])   [place] at [facility], Jesse Sans, MD       Future Appointments             In 2 months Army Melia Jesse Sans, MD [facility] [place] at [facility], [facility]               Requested Prescriptions  Pending Prescriptions Disp Refills   glucose blood (ONETOUCH ULTRA) test strip 100 strip 12    Sig: Use as instructed     Endocrinology: Diabetes - Testing Supplies Passed - 01/14/2023  9:53 AM      Passed - Valid encounter within last 12 months    Recent Outpatient Visits           1 month ago Type II diabetes mellitus with complication [facility])   [place] at [facility], Jesse Sans, MD   4 months ago Neuropathic pain, leg,  right   [place] at [facility], Jesse Sans, MD   6 months ago Type II diabetes mellitus with complication [facility])   [place] at [facility], Jesse Sans, MD   10 months ago Annual physical exam   [place] at [facility], Jesse Sans, MD   1 year ago Type II diabetes mellitus with complication [facility])   [place] at [facility], Jesse Sans, MD       Future Appointments             In 2 months Army Melia, Jesse Sans, MD [place] at [facility], [facility]

## 2023-03-19 ENCOUNTER — Ambulatory Visit (INDEPENDENT_AMBULATORY_CARE_PROVIDER_SITE_OTHER): Payer: Medicare Other | Admitting: Internal Medicine

## 2023-03-19 ENCOUNTER — Encounter: Payer: Self-pay | Admitting: Internal Medicine

## 2023-03-19 VITALS — BP 120/68 | HR 52 | Ht 63.0 in | Wt 198.2 lb

## 2023-03-19 DIAGNOSIS — Z0000 Encounter for general adult medical examination without abnormal findings: Secondary | ICD-10-CM

## 2023-03-19 DIAGNOSIS — I10 Essential (primary) hypertension: Secondary | ICD-10-CM | POA: Diagnosis not present

## 2023-03-19 DIAGNOSIS — E1169 Type 2 diabetes mellitus with other specified complication: Secondary | ICD-10-CM

## 2023-03-19 DIAGNOSIS — G4733 Obstructive sleep apnea (adult) (pediatric): Secondary | ICD-10-CM | POA: Diagnosis not present

## 2023-03-19 DIAGNOSIS — E118 Type 2 diabetes mellitus with unspecified complications: Secondary | ICD-10-CM

## 2023-03-19 DIAGNOSIS — M62830 Muscle spasm of back: Secondary | ICD-10-CM

## 2023-03-19 DIAGNOSIS — E785 Hyperlipidemia, unspecified: Secondary | ICD-10-CM

## 2023-03-19 MED ORDER — METHOCARBAMOL 500 MG PO TABS: 500.0000 mg | ORAL_TABLET | Freq: Every day | ORAL | 0 refills | Status: DC

## 2023-03-19 MED ORDER — TORSEMIDE 20 MG PO TABS: 20.0000 mg | ORAL_TABLET | Freq: Every day | ORAL | 3 refills | Status: DC

## 2023-03-19 NOTE — Progress Notes (Signed)
Date:  03/19/2023   Name:  Laura Adkins   DOB:  26-Aug-1956   MRN:  [id number]   Chief Complaint: Annual Exam Laura Adkins is a 67 y.o. female who presents today for her Complete Annual Exam. She feels poorly. She reports exercising - none at this time. She reports she is sleeping well. Breast complaints - none.  Mammogram: 2/20224 DEXA: none Pap smear: 02/2021 neg/neg Colonoscopy: 04/2018 repeat 10 yrs  Health Maintenance Due  Topic Date Due   Medicare Annual Wellness (AWV)  Never done   HIV Screening  Never done   DEXA SCAN  Never done   COVID-19 Vaccine (5 - 2023-24 season) 11/08/2022   Diabetic kidney evaluation - eGFR measurement  03/17/2023    Immunization History  Administered Date(s) Administered   Influenza,inj,Quad PF,6+ Mos 10/10/2015, 10/08/2016, 08/14/2017, 09/09/2018, 07/14/2019, 07/14/2020, 08/14/2021, 07/19/2022   Moderna Sars-Covid-2 Vaccination 02/08/2020, 03/11/2020, 09/23/2020, 09/13/2022   PNEUMOCOCCAL CONJUGATE-20 03/16/2022   Pneumococcal Polysaccharide-23 06/04/2013   Pneumococcal-Unspecified 06/04/2013   Tdap 06/28/2014, 02/06/2016   Zoster Recombinat (Shingrix) 07/14/2021, 11/14/2021    Hypertension This is a chronic problem. The problem is controlled. Pertinent negatives include no chest pain, headaches, palpitations or shortness of breath. Past treatments include calcium channel blockers and angiotensin blockers. The current treatment provides significant improvement. Hypertensive end-organ damage includes CAD/MI. There is no history of kidney disease or CVA.  Diabetes She presents for her follow-up diabetic visit. She has type 2 diabetes mellitus. Her disease course has been stable. Pertinent negatives for hypoglycemia include no dizziness, headaches, nervousness/anxiousness or tremors. Pertinent negatives for diabetes include no chest pain, no fatigue, no polydipsia and no polyuria. Pertinent negatives for diabetic complications  include no CVA. Current diabetic treatment includes oral agent (monotherapy).  Hyperlipidemia This is a chronic problem. The problem is controlled. Pertinent negatives include no chest pain or shortness of breath. Current antihyperlipidemic treatment includes statins.  Back Pain This is a new problem. The current episode started in the past 7 days. The problem occurs constantly. The problem is unchanged. The pain is present in the lumbar spine. The pain does not radiate. The pain is moderate. The symptoms are aggravated by twisting and bending. Pertinent negatives include no abdominal pain, chest pain, dysuria, fever or headaches. Risk factors: recent yard work.    Lab Results  Component Value Date   NA 142 03/16/2022   K 4.4 03/16/2022   CO2 25 03/16/2022   GLUCOSE 112 (H) 03/16/2022   BUN 16 03/16/2022   CREATININE 0.95 03/16/2022   CALCIUM 9.6 03/16/2022   EGFR 67 03/16/2022   GFRNONAA 72 03/11/2020   Lab Results  Component Value Date   CHOL 136 03/16/2022   HDL 62 03/16/2022   LDLCALC 57 03/16/2022   TRIG 87 03/16/2022   CHOLHDL 2.2 03/16/2022   Lab Results  Component Value Date   TSH 2.200 03/16/2022   Lab Results  Component Value Date   HGBA1C 6.7 (A) 11/22/2022   Lab Results  Component Value Date   WBC 4.9 03/16/2022   HGB 11.5 03/16/2022   HCT 35.1 03/16/2022   MCV 80 03/16/2022   PLT 252 03/16/2022   Lab Results  Component Value Date   ALT 27 03/16/2022   AST 25 03/16/2022   ALKPHOS 104 03/16/2022   BILITOT 0.4 03/16/2022   No results found for: "25OHVITD2", "25OHVITD3", "VD25OH"   Review of Systems  Constitutional:  Negative for chills, fatigue and fever.  HENT:  Negative for  congestion, hearing loss, tinnitus, trouble swallowing and voice change.   Eyes:  Negative for visual disturbance.  Respiratory:  Negative for cough, chest tightness, shortness of breath and wheezing.   Cardiovascular:  Negative for chest pain, palpitations and leg swelling.   Gastrointestinal:  Negative for abdominal pain, constipation, diarrhea and vomiting.  Endocrine: Negative for polydipsia and polyuria.  Genitourinary:  Negative for dysuria, frequency, genital sores, vaginal bleeding and vaginal discharge.  Musculoskeletal:  Positive for back pain. Negative for arthralgias, gait problem and joint swelling.  Skin:  Negative for color change and rash.  Neurological:  Negative for dizziness, tremors, light-headedness and headaches.  Hematological:  Negative for adenopathy. Does not bruise/bleed easily.  Psychiatric/Behavioral:  Negative for dysphoric mood and sleep disturbance. The patient is not nervous/anxious.     Patient Active Problem List   Diagnosis Date Noted   Diabetic polyneuropathy associated with type 2 diabetes mellitus (HCC) 11/22/2022   (HFpEF) heart failure with preserved ejection fraction (HCC) 08/16/2022   Localized edema 05/01/2019   Hyperlipidemia associated with type 2 diabetes mellitus (HCC) 11/06/2017   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    Celebrex  [Celecoxib] Rash    Past Surgical History:  Procedure Laterality Date   BREAST SURGERY  05/1997   necrosis   COLONOSCOPY  04/2008   COLONOSCOPY  04/2018   normal - repeat 10 yrs    Social History   Tobacco Use   Smoking status: Never   Smokeless tobacco: Never  Vaping Use    Vaping Use: Never used  Substance Use Topics   Alcohol use: No    Alcohol/week: 0.0 standard drinks of alcohol   Drug use: No     Medication list has been reviewed and updated.  Current Meds  Medication Sig   amLODipine (NORVASC) 5 MG tablet Take 5 mg by mouth daily.   aspirin 81 MG chewable tablet Chew 1 tablet by mouth daily.   atorvastatin (LIPITOR) 80 MG tablet Take 1 tablet (80 mg total) by mouth daily.   Blood Glucose Monitoring Suppl (ONE TOUCH ULTRA SYSTEM KIT) w/Device KIT 1 kit by Does not apply route once.   Calcium Carbonate-Vit D-Min (CALTRATE 600+D PLUS MINERALS) 600-800 MG-UNIT TABS Take 1 tablet by mouth daily.   glucose blood (ONETOUCH ULTRA) test strip USE TO TEST BLOOD SUGAR TWICE DAILY.   KLOR-CON M20 20 MEQ tablet Take 1 tablet by mouth daily.   Lancets (ONETOUCH DELICA PLUS LANCET33G) MISC APPLY 1 EACH TOPICALLY DAILY. AS DIRECTED   losartan (COZAAR) 100 MG tablet Take 1 tablet (100 mg total) by mouth daily.   metFORMIN (GLUCOPHAGE) 500 MG tablet Take 2 tablets (1,000 mg total) by mouth daily.   methocarbamol (ROBAXIN) 500 MG tablet Take 1 tablet (500 mg total) by mouth at bedtime.   Multiple Vitamins tablet Take by mouth.   nystatin cream (MYCOSTATIN) APPLY 1 APPLICATION DAILY to rash under  breast   triamcinolone (KENALOG) 0.1 % APPLY TO AFFECTED AREA TWICE A DAY (Patient taking differently: as needed.)   [DISCONTINUED] torsemide (DEMADEX) 20 MG tablet Take 1 tablet (20 mg total) by mouth daily.       03/19/2023    9:10 AM 11/22/2022    9:33 AM 08/21/2022    2:17 PM 07/19/2022    9:30 AM  GAD 7 : Generalized Anxiety Score  Nervous, Anxious, on Edge 0 0 0 0  Control/stop worrying 0 0 0 0  Worry too much - different things 0 1 0 0  Trouble relaxing 0 0 0 0  Restless 0 0 0 0  Easily annoyed or irritable 0 1 0 0  Afraid - awful might happen 0 0 0 0  Total GAD 7 Score 0 2 0 0  Anxiety Difficulty Not difficult at all Not difficult at all Not difficult at all  Not difficult at all       03/19/2023    9:10 AM 11/22/2022    9:33 AM 08/21/2022    2:17 PM  Depression screen PHQ 2/9  Decreased Interest 0 0 0  Down, Depressed, Hopeless 0 1 0  PHQ - 2 Score 0 1 0  Altered sleeping 0 0 0  Tired, decreased energy 1 0 0  Change in appetite 0 0 0  Feeling bad or failure about yourself  0 0 0  Trouble concentrating 0 0 0  Moving slowly or fidgety/restless 0 0 0  Suicidal thoughts 0 0 0  PHQ-9 Score 1 1 0  Difficult doing work/chores Not difficult at all Not difficult at all Not difficult at all    BP Readings from Last 3 Encounters:  03/19/23 120/68  11/22/22 102/60  08/21/22 120/70    Physical Exam Vitals and nursing note reviewed.  Constitutional:      General: She is not in acute distress.    Appearance: She is well-developed.  HENT:     Head: Normocephalic and atraumatic.     Right Ear: Tympanic membrane and ear canal normal.     Left Ear: Tympanic membrane and ear canal normal.     Nose:     Right Sinus: No maxillary sinus tenderness.     Left Sinus: No maxillary sinus tenderness.  Eyes:     General: No scleral icterus.       Right eye: No discharge.        Left eye: No discharge.     Conjunctiva/sclera: Conjunctivae normal.  Neck:     Thyroid: No thyromegaly.     Vascular: No carotid bruit.  Cardiovascular:     Rate and Rhythm: Normal rate and regular rhythm.     Pulses: Normal pulses.     Heart sounds: Normal heart sounds.  Pulmonary:     Effort: Pulmonary effort is normal. No respiratory distress.     Breath sounds: No wheezing.  Chest:  Breasts:    Right: No mass, nipple discharge, skin change or tenderness.     Left: No mass, nipple discharge, skin change or tenderness.  Abdominal:     General: Bowel sounds are normal.     Palpations: Abdomen is soft.     Tenderness: There is no abdominal tenderness.  Musculoskeletal:     Cervical back: Normal range of motion. No erythema.     Lumbar back: Tenderness present.  Negative right straight leg raise test and negative left straight leg raise test.     Right lower leg:  No edema.     Left lower leg: No edema.  Lymphadenopathy:     Cervical: No cervical adenopathy.  Skin:    General: Skin is warm and dry.     Capillary Refill: Capillary refill takes less than 2 seconds.     Findings: No rash.  Neurological:     General: No focal deficit present.     Mental Status: She is alert and oriented to person, place, and time.     Cranial Nerves: No cranial nerve deficit.     Sensory: No sensory deficit.     Deep Tendon Reflexes: Reflexes are normal and symmetric.  Psychiatric:        Attention and Perception: Attention normal.        Mood and Affect: Mood normal.    Diabetic Foot Exam - Simple   Simple Foot Form Diabetic Foot exam was performed with the following findings: Yes 03/19/2023  9:23 AM  Visual Inspection No deformities, no ulcerations, no other skin breakdown bilaterally: Yes Sensation Testing Intact to touch and monofilament testing bilaterally: Yes Pulse Check Posterior Tibialis and Dorsalis pulse intact bilaterally: Yes Comments      Wt Readings from Last 3 Encounters:  03/19/23 198 lb 3.2 oz (89.9 kg)  11/22/22 199 lb (90.3 kg)  08/21/22 202 lb 6.4 oz (91.8 kg)    BP 120/68   Pulse (!) 52   Ht 5\' 3"  (1.6 m)   Wt 198 lb 3.2 oz (89.9 kg)   SpO2 100%   BMI 35.11 kg/m   Assessment and Plan:  Problem List Items Addressed This Visit       Cardiovascular and Mediastinum   Essential (primary) hypertension (Chronic)    Stable exam with well controlled BP.  Currently taking amlodipine and losartan. Tolerating medications without concerns or side effects. Will continue to recommend low sodium diet and current regimen.       Relevant Medications   torsemide (DEMADEX) 20 MG tablet   Other Relevant Orders   CBC with Differential/Platelet   TSH     Respiratory   OSA on CPAP    Doing well on nightly CPAP. She needs a new  mask - Rx written          Endocrine   Hyperlipidemia associated with type 2 diabetes mellitus (HCC) (Chronic)    Tolerating statin medications.  No side effects noted. LDL is  Lab Results  Component Value Date   LDLCALC 57 03/16/2022  Atorvastatin 80 mg Will advise if dose change is indicated.       Relevant Medications   torsemide (DEMADEX) 20 MG tablet   Other Relevant Orders   Hemoglobin A1c   Lipid panel   Type II diabetes mellitus with complication (HCC) (Chronic)    Blood sugars stable without hypoglycemic symptoms or events. Currently being treated with metformin. Lab Results  Component Value Date   HGBA1C 6.7 (A) 11/22/2022  With a goal of < 8.0.       Relevant Orders   Comprehensive metabolic panel   Hemoglobin A1c   Microalbumin / creatinine urine ratio   Other Visit Diagnoses     Annual physical exam    -  Primary   mammogram done this year. colonoscopy due in 2029   Spasm of muscle of lower back       Take Tylenol 2 tablets twice a day Take methocarbamol at night Use heat 2-3 times per day to lower back  Return in about 4 months (around 07/19/2023) for DM, HTN.   Partially dictated using Dragon software, any errors are not intentional.  Reubin Milan, MD [facility] [place], [place]

## 2023-03-19 NOTE — Assessment & Plan Note (Signed)
Blood sugars stable without hypoglycemic symptoms or events. Currently being treated with metformin. Lab Results  Component Value Date   HGBA1C 6.7 (A) 11/22/2022   With a goal of < 8.0.

## 2023-03-19 NOTE — Assessment & Plan Note (Signed)
Stable exam with well controlled BP.  Currently taking amlodipine and losartan. Tolerating medications without concerns or side effects. Will continue to recommend low sodium diet and current regimen.

## 2023-03-19 NOTE — Patient Instructions (Addendum)
Take Tylenol 2 tablets twice a day  Take methocarbamol at night  Use heat 2-3 times per day to lower back  -It was a pleasure to see you today! Please review your visit summary for helpful information. -Lab results are usually available within 1-2 days and we will call once reviewed. -I would encourage you to follow your care via MyChart where you can access lab results, notes, messages, and more. -If you feel that we did a nice job today, please complete your after-visit survey and leave [place] a Google review! Your CMA today was Chassidy and your provider was Dr Bari Edward, MD.

## 2023-03-19 NOTE — Assessment & Plan Note (Addendum)
Doing well on nightly CPAP. She needs a new mask - Rx written

## 2023-03-19 NOTE — Assessment & Plan Note (Signed)
Tolerating statin medications.  No side effects noted. LDL is  Lab Results  Component Value Date   LDLCALC 57 03/16/2022  Atorvastatin 80 mg Will advise if dose change is indicated.

## 2023-03-20 LAB — CBC WITH DIFFERENTIAL/PLATELET
Basophils Absolute: 0.1 10*3/uL (ref 0.0–0.2)
Basos: 1 %
EOS (ABSOLUTE): 0.1 10*3/uL (ref 0.0–0.4)
Eos: 2 %
Hematocrit: 35.5 % (ref 34.0–46.6)
Hemoglobin: 11.1 g/dL (ref 11.1–15.9)
Immature Grans (Abs): 0 10*3/uL (ref 0.0–0.1)
Immature Granulocytes: 0 %
Lymphocytes Absolute: 2.2 10*3/uL (ref 0.7–3.1)
Lymphs: 41 %
MCH: 25.8 pg — ABNORMAL LOW (ref 26.6–33.0)
MCHC: 31.3 g/dL — ABNORMAL LOW (ref 31.5–35.7)
MCV: 83 fL (ref 79–97)
Monocytes Absolute: 0.5 10*3/uL (ref 0.1–0.9)
Monocytes: 9 %
Neutrophils Absolute: 2.5 10*3/uL (ref 1.4–7.0)
Neutrophils: 47 %
Platelets: 253 10*3/uL (ref 150–450)
RBC: 4.3 x10E6/uL (ref 3.77–5.28)
RDW: 13.7 % (ref 11.7–15.4)
WBC: 5.3 10*3/uL (ref 3.4–10.8)

## 2023-03-20 LAB — COMPREHENSIVE METABOLIC PANEL
ALT: 24 IU/L (ref 0–32)
AST: 20 IU/L (ref 0–40)
Albumin/Globulin Ratio: 1.5 (ref 1.2–2.2)
Albumin: 4.4 g/dL (ref 3.9–4.9)
Alkaline Phosphatase: 107 IU/L (ref 44–121)
BUN/Creatinine Ratio: 12 (ref 12–28)
BUN: 12 mg/dL (ref 8–27)
Bilirubin Total: 0.3 mg/dL (ref 0.0–1.2)
CO2: 25 mmol/L (ref 20–29)
Calcium: 9.7 mg/dL (ref 8.7–10.3)
Chloride: 100 mmol/L (ref 96–106)
Creatinine, Ser: 0.98 mg/dL (ref 0.57–1.00)
Globulin, Total: 2.9 g/dL (ref 1.5–4.5)
Glucose: 114 mg/dL — ABNORMAL HIGH (ref 70–99)
Potassium: 4 mmol/L (ref 3.5–5.2)
Sodium: 140 mmol/L (ref 134–144)
Total Protein: 7.3 g/dL (ref 6.0–8.5)
eGFR: 64 mL/min/{1.73_m2} (ref >59)

## 2023-03-20 LAB — MICROALBUMIN / CREATININE URINE RATIO
Creatinine, Urine: 268.9 mg/dL
Microalb/Creat Ratio: 34 mg/g creat — ABNORMAL HIGH (ref 0–29)
Microalbumin, Urine: 91.3 ug/mL

## 2023-03-20 LAB — HEMOGLOBIN A1C
Est. average glucose Bld gHb Est-mCnc: 140 mg/dL
Hgb A1c MFr Bld: 6.5 % — ABNORMAL HIGH (ref 4.8–5.6)

## 2023-03-20 LAB — TSH: TSH: 2.4 u[IU]/mL (ref 0.450–4.500)

## 2023-03-20 LAB — LIPID PANEL
Chol/HDL Ratio: 2.3 ratio (ref 0.0–4.4)
Cholesterol, Total: 151 mg/dL (ref 100–199)
HDL: 67 mg/dL (ref >39)
LDL Chol Calc (NIH): 66 mg/dL (ref 0–99)
Triglycerides: 101 mg/dL (ref 0–149)
VLDL Cholesterol Cal: 18 mg/dL (ref 5–40)

## 2023-04-17 ENCOUNTER — Ambulatory Visit (INDEPENDENT_AMBULATORY_CARE_PROVIDER_SITE_OTHER): Payer: Medicare Other

## 2023-04-17 VITALS — BP 124/70 | Ht 63.0 in | Wt 200.6 lb

## 2023-04-17 DIAGNOSIS — Z0000 Encounter for general adult medical examination without abnormal findings: Secondary | ICD-10-CM

## 2023-04-17 DIAGNOSIS — Z780 Asymptomatic menopausal state: Secondary | ICD-10-CM

## 2023-04-17 NOTE — Progress Notes (Signed)
Subjective:   Laura Adkins is a 67 y.o. female who presents for Medicare Annual (Subsequent) preventive examination.  Review of Systems     Cardiac Risk Factors include: advanced age (>53men, >24 women);diabetes mellitus;hypertension;dyslipidemia;obesity (BMI >30kg/m2)     Objective:    Today's Vitals   04/17/23 1015  BP: 124/70  Weight: 200 lb 9.6 oz (91 kg)  Height: 5\' 3"  (1.6 m)   Body mass index is 35.53 kg/m.     04/17/2023   10:29 AM 03/07/2016   10:12 AM 02/06/2016    9:19 AM 01/06/2016    3:33 PM 06/08/2015    9:12 AM  Advanced Directives  Does Patient Have a Medical Advance Directive? No No No No No  Would patient like information on creating a medical advance directive? No - Patient declined No - patient declined information No - patient declined information No - patient declined information Yes - Educational materials given    Current Medications (verified) Outpatient Encounter Medications as of 04/17/2023  Medication Sig   amLODipine (NORVASC) 5 MG tablet Take 5 mg by mouth daily.   aspirin 81 MG chewable tablet Chew 1 tablet by mouth daily.   atorvastatin (LIPITOR) 80 MG tablet Take 1 tablet (80 mg total) by mouth daily.   Blood Glucose Monitoring Suppl (ONE TOUCH ULTRA SYSTEM KIT) w/Device KIT 1 kit by Does not apply route once.   Calcium Carbonate-Vit D-Min (CALTRATE 600+D PLUS MINERALS) 600-800 MG-UNIT TABS Take 1 tablet by mouth daily.   glucose blood (ONETOUCH ULTRA) test strip USE TO TEST BLOOD SUGAR TWICE DAILY.   KLOR-CON M20 20 MEQ tablet Take 1 tablet by mouth daily.   Lancets (ONETOUCH DELICA PLUS LANCET33G) MISC APPLY 1 EACH TOPICALLY DAILY. AS DIRECTED   losartan (COZAAR) 100 MG tablet Take 1 tablet (100 mg total) by mouth daily.   metFORMIN (GLUCOPHAGE) 500 MG tablet Take 2 tablets (1,000 mg total) by mouth daily.   methocarbamol (ROBAXIN) 500 MG tablet Take 1 tablet (500 mg total) by mouth at bedtime.   Multiple Vitamins tablet Take by  mouth.   nystatin cream (MYCOSTATIN) APPLY 1 APPLICATION DAILY to rash under breast   torsemide (DEMADEX) 20 MG tablet Take 1 tablet (20 mg total) by mouth daily.   triamcinolone (KENALOG) 0.1 % APPLY TO AFFECTED AREA TWICE A DAY (Patient not taking: Reported on 04/17/2023)   No facility-administered encounter medications on file as of 04/17/2023.    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   Overview:  lgsil pap 01/2013 colpo 02/2014 Suspect d/t atrophy  ASCUS Pap 2021 neg HPV - repeat one year   Past Surgical History:  Procedure Laterality Date   BREAST SURGERY  05/1997   necrosis   COLONOSCOPY  04/2008   COLONOSCOPY  04/2018   normal - repeat 10 yrs   Family History  Problem Relation Age of Onset   Heart disease Mother    Diabetes Father    Heart disease Father    Diabetes Brother    Social History   Socioeconomic History   Marital status: Single    Spouse name: Not on file   Number of children: Not on file   Years of education: Not on file   Highest education level:  Not on file  Occupational History   Not on file  Tobacco Use   Smoking status: Never   Smokeless tobacco: Never  Vaping Use   Vaping Use: Never used  Substance and Sexual Activity   Alcohol use: No    Alcohol/week: 0.0 standard drinks of alcohol   Drug use: No   Sexual activity: Not on file  Other Topics Concern   Not on file  Social History Narrative   Not on file   Social Determinants of Health   Financial Resource Strain: Low Risk  (04/17/2023)   Overall Financial Resource Strain (CARDIA)    Difficulty of Paying Living Expenses: Not very hard  Food Insecurity: No Food Insecurity (04/17/2023)   Hunger Vital Sign    Worried About Running  Out of Food in the Last Year: Never true    Ran Out of Food in the Last Year: Never true  Transportation Needs: No Transportation Needs (04/17/2023)   PRAPARE - Administrator, Civil Service (Medical): No    Lack of Transportation (Non-Medical): No  Physical Activity: Sufficiently Active (04/17/2023)   Exercise Vital Sign    Days of Exercise per Week: 3 days    Minutes of Exercise per Session: 60 min  Stress: No Stress Concern Present (04/17/2023)   [employer] of Occupational Health - Occupational Stress Questionnaire    Feeling of Stress : Only a little  Social Connections: Moderately Integrated (04/17/2023)   Social Connection and Isolation Panel [NHANES]    Frequency of Communication with Friends and Family: More than three times a week    Frequency of Social Gatherings with Friends and Family: Once a week    Attends Religious Services: More than 4 times per year    Active Member of [employer] or Organizations: Yes    Attends Banker Meetings: Never    Marital Status: Never married    Tobacco Counseling Counseling given: Not Answered   Clinical Intake:  Pre-visit preparation completed: Yes  Pain : No/denies pain     Nutritional Risks: None Diabetes: Yes CBG done?: No Did pt. bring in CBG monitor from home?: No  How often do you need to have someone help you when you read instructions, pamphlets, or other written materials from your doctor or pharmacy?: 1 - Never  Diabetic?yes Nutrition Risk Assessment:  Has the patient had any N/V/D within the last 2 months?  No  Does the patient have any non-healing wounds?  No  Has the patient had any unintentional weight loss or weight gain?  No   Diabetes:  Is the patient diabetic?  Yes  If diabetic, was a CBG obtained today?  No  Did the patient bring in their glucometer from home?  No  How often do you monitor your CBG's? Every day.   Financial Strains and Diabetes Management:  Are you having  any financial strains with the device, your supplies or your medication? No .  Does the patient want to be seen by Chronic Care Management for management of their diabetes?  No  Would the patient like to be referred to a Nutritionist or for Diabetic Management?  No   Diabetic Exams:  Diabetic Eye Exam: Completed 08/21/22.  Pt has been advised about the importance in completing this exam..  Diabetic Foot Exam: Completed 03/19/23. Pt has been advised about the importance in completing this exam   Interpreter Needed?: No  Information entered by :: Kennedy Bucker, LPN   Activities of Daily  Living    04/17/2023   10:30 AM  In your present state of health, do you have any difficulty performing the following activities:  Hearing? 0  Vision? 0  Difficulty concentrating or making decisions? 0  Walking or climbing stairs? 0  Dressing or bathing? 0  Doing errands, shopping? 0  Preparing Food and eating ? N  Using the Toilet? N  In the past six months, have you accidently leaked urine? N  Do you have problems with loss of bowel control? N  Managing your Medications? N  Managing your Finances? N  Housekeeping or managing your Housekeeping? N    Patient Care Team: Reubin Milan, MD as PCP - General (Internal Medicine) Tami Ribas, Virgie Dad, MD as Referring Physician (Cardiology) [facility] (Ophthalmology)  Indicate any recent Medical Services you may have received from other than [facility] providers in the past year (date may be approximate).     Assessment:   This is a routine wellness examination for [place].  Hearing/Vision screen Hearing Screening - Comments:: Wears aids Vision Screening - Comments:: Readers- [facility]  Dietary issues and exercise activities discussed: Current Exercise Habits: Home exercise routine, Type of exercise: walking, Time (Minutes): 60, Frequency (Times/Week): 3, Weekly Exercise (Minutes/Week): 180, Intensity: Mild   Goals Addressed              This Visit's Progress    DIET - EAT MORE FRUITS AND VEGETABLES         Depression Screen    04/17/2023   10:22 AM 03/19/2023    9:10 AM 11/22/2022    9:33 AM 08/21/2022    2:17 PM 07/19/2022    9:30 AM 03/16/2022    9:42 AM 11/14/2021    1:49 PM  PHQ 2/9 Scores  PHQ - 2 Score 0 0 1 0 0 0 0  PHQ- 9 Score 0 1 1 0 0 0 1    Fall Risk    04/17/2023   10:30 AM 03/19/2023    9:09 AM 11/22/2022    9:33 AM 08/21/2022    2:17 PM 07/19/2022    9:31 AM  Fall Risk   Falls in the past year? 0 0 0 0 0  Number falls in past yr: 0 0 0 0 0  Injury with Fall? 0 0 0 0 0  Risk for fall due to : No Fall Risks No Fall Risks No Fall Risks No Fall Risks No Fall Risks  Follow up Falls prevention discussed;Falls evaluation completed Falls evaluation completed Falls evaluation completed Falls evaluation completed Falls evaluation completed    FALL RISK PREVENTION PERTAINING TO THE HOME:  Any stairs in or around the home? No  If so, are there any without handrails? No  Home free of loose throw rugs in walkways, pet beds, electrical cords, etc? Yes  Adequate lighting in your home to reduce risk of falls? Yes   ASSISTIVE DEVICES UTILIZED TO PREVENT FALLS:  Life alert? No  Use of a cane, walker or w/c? Yes - rw once in a while Grab bars in the bathroom? No  Shower chair or bench in shower? No  Elevated toilet seat or a handicapped toilet? No   TIMED UP AND GO:  Was the test performed? Yes .  Length of time to ambulate 10 feet: 4 sec.   Gait steady and fast without use of assistive device  Cognitive Function:        04/17/2023   10:41 AM  6CIT Screen  What Year? 0 points  What month? 0 points  What time? 0 points  Count back from 20 0 points  Months in reverse 0 points  Repeat phrase 0 points  Total Score 0 points    Immunizations Immunization History  Administered Date(s) Administered   Influenza,inj,Quad PF,6+ Mos 10/10/2015, 10/08/2016, 08/14/2017, 09/09/2018, 07/14/2019,  07/14/2020, 08/14/2021, 07/19/2022   Moderna Sars-Covid-2 Vaccination 02/08/2020, 03/11/2020, 09/23/2020, 09/13/2022   PNEUMOCOCCAL CONJUGATE-20 03/16/2022   Pneumococcal Polysaccharide-23 06/04/2013   Pneumococcal-Unspecified 06/04/2013   Tdap 06/28/2014, 02/06/2016   Zoster Recombinat (Shingrix) 07/14/2021, 11/14/2021    TDAP status: Up to date  Flu Vaccine status: Up to date  Pneumococcal vaccine status: Up to date  Covid-19 vaccine status: Completed vaccines  Qualifies for Shingles Vaccine? Yes   Zostavax completed No   Shingrix Completed?: Yes  Screening Tests Health Maintenance  Topic Date Due   HIV Screening  Never done   DEXA SCAN  Never done   COVID-19 Vaccine (5 - 2023-24 season) 11/08/2022   INFLUENZA VACCINE  06/20/2023   OPHTHALMOLOGY EXAM  08/22/2023   HEMOGLOBIN A1C  09/18/2023   MAMMOGRAM  01/04/2024   PAP SMEAR-Modifier  03/14/2024   Diabetic kidney evaluation - eGFR measurement  03/18/2024   Diabetic kidney evaluation - Urine ACR  03/18/2024   FOOT EXAM  03/18/2024   Medicare Annual Wellness (AWV)  04/16/2024   DTaP/Tdap/Td (3 - Td or Tdap) 02/05/2026   Colonoscopy  05/08/2028   Pneumonia Vaccine 26+ Years old  Completed   Hepatitis C Screening  Completed   Zoster Vaccines- Shingrix  Completed   HPV VACCINES  Aged Out    Health Maintenance  Health Maintenance Due  Topic Date Due   HIV Screening  Never done   DEXA SCAN  Never done   COVID-19 Vaccine (5 - 2023-24 season) 11/08/2022    Colorectal cancer screening: Type of screening: Colonoscopy. Completed 05/08/18. Repeat every 10 years  Mammogram status: Completed 01/03/23. Repeat every year  Bone Density status: Ordered 04/17/23. Pt provided with contact info and advised to call to schedule appt.  Lung Cancer Screening: (Low Dose CT Chest recommended if Age 2-80 years, 30 pack-year currently smoking OR have quit w/in 15years.) does not qualify.    Additional Screening:  Hepatitis C  Screening: does qualify; Completed 02/06/16  Vision Screening: Recommended annual ophthalmology exams for early detection of glaucoma and other disorders of the eye. Is the patient up to date with their annual eye exam?  Yes  Who is the provider or what is the name of the office in which the patient attends annual eye exams? [facility] If pt is not established with a provider, would they like to be referred to a provider to establish care? No .   Dental Screening: Recommended annual dental exams for proper oral hygiene  Community Resource Referral / Chronic Care Management: CRR required this visit?  No   CCM required this visit?  No      Plan:     I have personally reviewed and noted the following in the patient's chart:   Medical and social history Use of alcohol, tobacco or illicit drugs  Current medications and supplements including opioid prescriptions. Patient is not currently taking opioid prescriptions. Functional ability and status Nutritional status Physical activity Advanced directives List of other physicians Hospitalizations, surgeries, and ER visits in previous 12 months Vitals Screenings to include cognitive, depression, and falls Referrals and appointments  In addition, I have reviewed and discussed with  patient certain preventive protocols, quality metrics, and best practice recommendations. A written personalized care plan for preventive services as well as general preventive health recommendations were provided to patient.     Hal Hope, LPN   12/07/1476   Nurse Notes: none

## 2023-04-17 NOTE — Patient Instructions (Signed)
Laura Adkins , Thank you for taking time to come for your Medicare Wellness Visit. I appreciate your ongoing commitment to your health goals. Please review the following plan we discussed and let me know if I can assist you in the future.   These are the goals we discussed:  Goals      DIET - EAT MORE FRUITS AND VEGETABLES        This is a list of the screening recommended for you and due dates:  Health Maintenance  Topic Date Due   HIV Screening  Never done   DEXA scan (bone density measurement)  Never done   COVID-19 Vaccine (5 - 2023-24 season) 11/08/2022   Flu Shot  06/20/2023   Eye exam for diabetics  08/22/2023   Hemoglobin A1C  09/18/2023   Mammogram  01/04/2024   Pap Smear  03/14/2024   Yearly kidney function blood test for diabetes  03/18/2024   Yearly kidney health urinalysis for diabetes  03/18/2024   Complete foot exam   03/18/2024   Medicare Annual Wellness Visit  04/16/2024   DTaP/Tdap/Td vaccine (3 - Td or Tdap) 02/05/2026   Colon Cancer Screening  05/08/2028   Pneumonia Vaccine  Completed   Hepatitis C Screening  Completed   Zoster (Shingles) Vaccine  Completed   HPV Vaccine  Aged Out    Advanced directives: no  Conditions/risks identified: none  Next appointment: Follow up in one year for your annual wellness visit 04/22/24 @ 10:15 am in person   Preventive Care 65 Years and Older, Female Preventive care refers to lifestyle choices and visits with your health care provider that can promote health and wellness. What does preventive care include? A yearly physical exam. This is also called an annual well check. Dental exams once or twice a year. Routine eye exams. Ask your health care provider how often you should have your eyes checked. Personal lifestyle choices, including: Daily care of your teeth and gums. Regular physical activity. Eating a healthy diet. Avoiding tobacco and drug use. Limiting alcohol use. Practicing safe sex. Taking low-dose  aspirin every day. Taking vitamin and mineral supplements as recommended by your health care provider. What happens during an annual well check? The services and screenings done by your health care provider during your annual well check will depend on your age, overall health, lifestyle risk factors, and family history of disease. Counseling  Your health care provider may ask you questions about your: Alcohol use. Tobacco use. Drug use. Emotional well-being. Home and relationship well-being. Sexual activity. Eating habits. History of falls. Memory and ability to understand (cognition). Work and work Astronomer. Reproductive health. Screening  You may have the following tests or measurements: Height, weight, and BMI. Blood pressure. Lipid and cholesterol levels. These may be checked every 5 years, or more frequently if you are over 55 years old. Skin check. Lung cancer screening. You may have this screening every year starting at age 75 if you have a 30-pack-year history of smoking and currently smoke or have quit within the past 15 years. Fecal occult blood test (FOBT) of the stool. You may have this test every year starting at age 55. Flexible sigmoidoscopy or colonoscopy. You may have a sigmoidoscopy every 5 years or a colonoscopy every 10 years starting at age 57. Hepatitis C blood test. Hepatitis B blood test. Sexually transmitted disease (STD) testing. Diabetes screening. This is done by checking your blood sugar (glucose) after you have not eaten for a while (fasting).  You may have this done every 1-3 years. Bone density scan. This is done to screen for osteoporosis. You may have this done starting at age 71. Mammogram. This may be done every 1-2 years. Talk to your health care provider about how often you should have regular mammograms. Talk with your health care provider about your test results, treatment options, and if necessary, the need for more tests. Vaccines  Your  health care provider may recommend certain vaccines, such as: Influenza vaccine. This is recommended every year. Tetanus, diphtheria, and acellular pertussis (Tdap, Td) vaccine. You may need a Td booster every 10 years. Zoster vaccine. You may need this after age 26. Pneumococcal 13-valent conjugate (PCV13) vaccine. One dose is recommended after age 15. Pneumococcal polysaccharide (PPSV23) vaccine. One dose is recommended after age 49. Talk to your health care provider about which screenings and vaccines you need and how often you need them. This information is not intended to replace advice given to you by your health care provider. Make sure you discuss any questions you have with your health care provider. Document Released: 12/02/2015 Document Revised: 07/25/2016 Document Reviewed: 09/06/2015 Elsevier Interactive Patient Education  2017 [employer].  Fall Prevention in the Home Falls can cause injuries. They can happen to people of all ages. There are many things you can do to make your home safe and to help prevent falls. What can I do on the outside of my home? Regularly fix the edges of walkways and driveways and fix any cracks. Remove anything that might make you trip as you walk through a door, such as a raised step or threshold. Trim any bushes or trees on the path to your home. Use bright outdoor lighting. Clear any walking paths of anything that might make someone trip, such as rocks or tools. Regularly check to see if handrails are loose or broken. Make sure that both sides of any steps have handrails. Any raised decks and porches should have guardrails on the edges. Have any leaves, snow, or ice cleared regularly. Use sand or salt on walking paths during winter. Clean up any spills in your garage right away. This includes oil or grease spills. What can I do in the bathroom? Use night lights. Install grab bars by the toilet and in the tub and shower. Do not use towel bars as  grab bars. Use non-skid mats or decals in the tub or shower. If you need to sit down in the shower, use a plastic, non-slip stool. Keep the floor dry. Clean up any water that spills on the floor as soon as it happens. Remove soap buildup in the tub or shower regularly. Attach bath mats securely with double-sided non-slip rug tape. Do not have throw rugs and other things on the floor that can make you trip. What can I do in the bedroom? Use night lights. Make sure that you have a light by your bed that is easy to reach. Do not use any sheets or blankets that are too big for your bed. They should not hang down onto the floor. Have a firm chair that has side arms. You can use this for support while you get dressed. Do not have throw rugs and other things on the floor that can make you trip. What can I do in the kitchen? Clean up any spills right away. Avoid walking on wet floors. Keep items that you use a lot in easy-to-reach places. If you need to reach something above you, use a  strong step stool that has a grab bar. Keep electrical cords out of the way. Do not use floor polish or wax that makes floors slippery. If you must use wax, use non-skid floor wax. Do not have throw rugs and other things on the floor that can make you trip. What can I do with my stairs? Do not leave any items on the stairs. Make sure that there are handrails on both sides of the stairs and use them. Fix handrails that are broken or loose. Make sure that handrails are as long as the stairways. Check any carpeting to make sure that it is firmly attached to the stairs. Fix any carpet that is loose or worn. Avoid having throw rugs at the top or bottom of the stairs. If you do have throw rugs, attach them to the floor with carpet tape. Make sure that you have a light switch at the top of the stairs and the bottom of the stairs. If you do not have them, ask someone to add them for you. What else can I do to help prevent  falls? Wear shoes that: Do not have high heels. Have rubber bottoms. Are comfortable and fit you well. Are closed at the toe. Do not wear sandals. If you use a stepladder: Make sure that it is fully opened. Do not climb a closed stepladder. Make sure that both sides of the stepladder are locked into place. Ask someone to hold it for you, if possible. Clearly mark and make sure that you can see: Any grab bars or handrails. First and last steps. Where the edge of each step is. Use tools that help you move around (mobility aids) if they are needed. These include: Canes. Walkers. Scooters. Crutches. Turn on the lights when you go into a dark area. Replace any light bulbs as soon as they burn out. Set up your furniture so you have a clear path. Avoid moving your furniture around. If any of your floors are uneven, fix them. If there are any pets around you, be aware of where they are. Review your medicines with your doctor. Some medicines can make you feel dizzy. This can increase your chance of falling. Ask your doctor what other things that you can do to help prevent falls. This information is not intended to replace advice given to you by your health care provider. Make sure you discuss any questions you have with your health care provider. Document Released: 09/01/2009 Document Revised: 04/12/2016 Document Reviewed: 12/10/2014 Elsevier Interactive Patient Education  2017 [employer].

## 2023-05-04 ENCOUNTER — Other Ambulatory Visit: Payer: Self-pay | Admitting: Internal Medicine

## 2023-05-04 DIAGNOSIS — E118 Type 2 diabetes mellitus with unspecified complications: Secondary | ICD-10-CM

## 2023-05-09 ENCOUNTER — Other Ambulatory Visit: Payer: Self-pay | Admitting: Internal Medicine

## 2023-05-09 DIAGNOSIS — E1169 Type 2 diabetes mellitus with other specified complication: Secondary | ICD-10-CM

## 2023-05-09 DIAGNOSIS — I10 Essential (primary) hypertension: Secondary | ICD-10-CM

## 2023-06-23 ENCOUNTER — Other Ambulatory Visit: Payer: Self-pay | Admitting: Internal Medicine

## 2023-06-23 DIAGNOSIS — B354 Tinea corporis: Secondary | ICD-10-CM

## 2023-07-23 ENCOUNTER — Encounter: Payer: Self-pay | Admitting: Internal Medicine

## 2023-07-23 ENCOUNTER — Ambulatory Visit (INDEPENDENT_AMBULATORY_CARE_PROVIDER_SITE_OTHER): Payer: Medicare Other | Admitting: Internal Medicine

## 2023-07-23 VITALS — BP 126/68 | HR 61 | Ht 63.0 in | Wt 196.4 lb

## 2023-07-23 DIAGNOSIS — Z7984 Long term (current) use of oral hypoglycemic drugs: Secondary | ICD-10-CM

## 2023-07-23 DIAGNOSIS — I10 Essential (primary) hypertension: Secondary | ICD-10-CM | POA: Diagnosis not present

## 2023-07-23 DIAGNOSIS — Z23 Encounter for immunization: Secondary | ICD-10-CM

## 2023-07-23 DIAGNOSIS — E118 Type 2 diabetes mellitus with unspecified complications: Secondary | ICD-10-CM

## 2023-07-23 LAB — POCT GLYCOSYLATED HEMOGLOBIN (HGB A1C): Hemoglobin A1C: 5.9 % — AB (ref 4.0–5.6)

## 2023-07-23 MED ORDER — AMLODIPINE BESYLATE 5 MG PO TABS: 5.0000 mg | ORAL_TABLET | Freq: Every day | ORAL | 0 refills | Status: DC

## 2023-07-23 NOTE — Progress Notes (Signed)
Date:  07/23/2023   Name:  Laura Adkins   DOB:  May 02, 1956   MRN:  [id number]   Chief Complaint: Diabetes and Hypertension  Diabetes She presents for her follow-up diabetic visit. She has type 2 diabetes mellitus. Pertinent negatives for hypoglycemia include no headaches or tremors. Pertinent negatives for diabetes include no chest pain, no fatigue, no polydipsia and no polyuria.  Hypertension This is a chronic problem. The problem is controlled. Pertinent negatives include no chest pain, headaches, palpitations or shortness of breath. Past treatments include angiotensin blockers, calcium channel blockers and diuretics. The current treatment provides significant improvement.    Lab Results  Component Value Date   NA 140 03/19/2023   K 4.0 03/19/2023   CO2 25 03/19/2023   GLUCOSE 114 (H) 03/19/2023   BUN 12 03/19/2023   CREATININE 0.98 03/19/2023   CALCIUM 9.7 03/19/2023   EGFR 64 03/19/2023   GFRNONAA 72 03/11/2020   Lab Results  Component Value Date   CHOL 151 03/19/2023   HDL 67 03/19/2023   LDLCALC 66 03/19/2023   TRIG 101 03/19/2023   CHOLHDL 2.3 03/19/2023   Lab Results  Component Value Date   TSH 2.400 03/19/2023   Lab Results  Component Value Date   HGBA1C 5.9 (A) 07/23/2023   Lab Results  Component Value Date   WBC 5.3 03/19/2023   HGB 11.1 03/19/2023   HCT 35.5 03/19/2023   MCV 83 03/19/2023   PLT 253 03/19/2023   Lab Results  Component Value Date   ALT 24 03/19/2023   AST 20 03/19/2023   ALKPHOS 107 03/19/2023   BILITOT 0.3 03/19/2023   No results found for: "25OHVITD2", "25OHVITD3", "VD25OH"   Review of Systems  Constitutional:  Negative for appetite change, fatigue, fever and unexpected weight change.  HENT:  Negative for tinnitus and trouble swallowing.   Eyes:  Negative for visual disturbance.  Respiratory:  Negative for cough, chest tightness and shortness of breath.   Cardiovascular:  Negative for chest pain, palpitations and  leg swelling.  Gastrointestinal:  Negative for abdominal pain.  Endocrine: Negative for polydipsia and polyuria.  Genitourinary:  Negative for dysuria and hematuria.  Musculoskeletal:  Negative for arthralgias.  Neurological:  Negative for tremors, numbness and headaches.  Psychiatric/Behavioral:  Negative for dysphoric mood.     Patient Active Problem List   Diagnosis Date Noted   Diabetic polyneuropathy associated with type 2 diabetes mellitus (HCC) 11/22/2022   (HFpEF) heart failure with preserved ejection fraction (HCC) 08/16/2022   Localized edema 05/01/2019   Hyperlipidemia associated with type 2 diabetes mellitus (HCC) 11/06/2017   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    Celebrex  [Celecoxib] Rash    Past Surgical History:  Procedure Laterality Date   BREAST SURGERY  05/1997   necrosis   COLONOSCOPY  04/2008   COLONOSCOPY  04/2018   normal - repeat 10 yrs  Social History   Tobacco Use   Smoking status: Never   Smokeless tobacco: Never  Vaping Use   Vaping status: Never Used  Substance Use Topics   Alcohol use: No    Alcohol/week: 0.0 standard drinks of alcohol   Drug use: No     Medication list has been reviewed and updated.  Current Meds  Medication Sig   aspirin 81 MG chewable tablet Chew 1 tablet by mouth daily.   atorvastatin (LIPITOR) 80 MG tablet  TAKE 1 TABLET BY MOUTH EVERY DAY   Blood Glucose Monitoring Suppl (ONE TOUCH ULTRA SYSTEM KIT) w/Device KIT 1 kit by Does not apply route once.   Calcium Carbonate-Vit D-Min (CALTRATE 600+D PLUS MINERALS) 600-800 MG-UNIT TABS Take 1 tablet by mouth daily.   glucose blood (ONETOUCH ULTRA) test strip USE TO TEST BLOOD SUGAR TWICE DAILY.   KLOR-CON M20 20 MEQ tablet Take 1 tablet by mouth daily.   Lancets (ONETOUCH DELICA PLUS LANCET33G) MISC APPLY 1 EACH TOPICALLY DAILY. AS DIRECTED   losartan (COZAAR) 100 MG tablet TAKE 1 TABLET BY MOUTH EVERY DAY   metFORMIN (GLUCOPHAGE) 500 MG tablet TAKE 2 TABLETS BY MOUTH EVERY DAY   Multiple Vitamins tablet Take by mouth.   nystatin cream (MYCOSTATIN) APPLY 1 APPLICATION DAILY TO RASH UNDER BREAST   torsemide (DEMADEX) 20 MG tablet Take 1 tablet (20 mg total) by mouth daily.   triamcinolone (KENALOG) 0.1 % APPLY TO AFFECTED AREA TWICE A DAY   [DISCONTINUED] amLODipine (NORVASC) 5 MG tablet Take 5 mg by mouth daily.       07/23/2023    4:11 PM 03/19/2023    9:10 AM 11/22/2022    9:33 AM 08/21/2022    2:17 PM  GAD 7 : Generalized Anxiety Score  Nervous, Anxious, on Edge 1 0 0 0  Control/stop worrying 0 0 0 0  Worry too much - different things 1 0 1 0  Trouble relaxing 0 0 0 0  Restless 0 0 0 0  Easily annoyed or irritable 1 0 1 0  Afraid - awful might happen 0 0 0 0  Total GAD 7 Score 3 0 2 0  Anxiety Difficulty Not difficult at all Not difficult at all Not difficult at all Not difficult at all       07/23/2023    4:11 PM 04/17/2023   10:22 AM 03/19/2023    9:10 AM  Depression screen PHQ 2/9  Decreased Interest 0 0 0  Down, Depressed, Hopeless 0 0 0  PHQ - 2 Score 0 0 0  Altered sleeping 0 0 0  Tired, decreased energy 0 0 1  Change in appetite 0 0 0  Feeling bad or failure about yourself  0 0 0  Trouble concentrating 0 0 0  Moving slowly or fidgety/restless 0 0 0  Suicidal thoughts 0 0 0  PHQ-9 Score 0 0 1  Difficult doing work/chores Not  difficult at all Not difficult at all Not difficult at all    BP Readings from Last 3 Encounters:  07/23/23 126/68  04/17/23 124/70  03/19/23 120/68    Physical Exam Vitals and nursing note reviewed.  Constitutional:      General: She is not in acute distress.    Appearance: She is well-developed.  HENT:     Head: Normocephalic and atraumatic.  Neck:     Vascular: No carotid bruit.  Cardiovascular:     Rate and Rhythm: Normal rate and regular rhythm.  Pulmonary:  Effort: Pulmonary effort is normal. No respiratory distress.     Breath sounds: No wheezing or rhonchi.  Musculoskeletal:     Cervical back: Normal range of motion.     Right lower leg: No edema.     Left lower leg: No edema.  Lymphadenopathy:     Cervical: No cervical adenopathy.  Skin:    General: Skin is warm and dry.     Capillary Refill: Capillary refill takes less than 2 seconds.     Findings: No rash.  Neurological:     Mental Status: She is alert and oriented to person, place, and time.  Psychiatric:        Mood and Affect: Mood normal.        Behavior: Behavior normal.     Wt Readings from Last 3 Encounters:  07/23/23 196 lb 6.4 oz (89.1 kg)  04/17/23 200 lb 9.6 oz (91 kg)  03/19/23 198 lb 3.2 oz (89.9 kg)    BP 126/68   Pulse 61   Ht 5\' 3"  (1.6 m)   Wt 196 lb 6.4 oz (89.1 kg)   SpO2 100%   BMI 34.79 kg/m   Assessment and Plan:  Problem List Items Addressed This Visit       Unprioritized   Type II diabetes mellitus with complication (HCC) (Chronic)    Blood sugars stable without hypoglycemic symptoms or events. Current regimen is metformin bid. Changes made last visit are none. Lab Results  Component Value Date   HGBA1C 6.5 (H) 03/19/2023  A1C today = 5.9       Relevant Orders   POCT glycosylated hemoglobin (Hb A1C) (Completed)   Essential (primary) hypertension - Primary (Chronic)    Normal exam with stable BP on losartan, torsemide, amlodipine. No concerns or side  effects to current medication. No change in regimen; continue low sodium diet.       Relevant Medications   amLODipine (NORVASC) 5 MG tablet   Other Visit Diagnoses     Long term current use of oral hypoglycemic drug       Need for influenza vaccination       Relevant Orders   Flu Vaccine Trivalent High Dose (Fluad) (Completed)       Return in about 4 months (around 11/22/2023) for DM, HTN.    Reubin Milan, MD [facility] [place]

## 2023-07-23 NOTE — Assessment & Plan Note (Signed)
Normal exam with stable BP on losartan, torsemide, amlodipine. No concerns or side effects to current medication. No change in regimen; continue low sodium diet.

## 2023-07-23 NOTE — Assessment & Plan Note (Addendum)
Blood sugars stable without hypoglycemic symptoms or events. Current regimen is metformin bid. Changes made last visit are none. Lab Results  Component Value Date   HGBA1C 6.5 (H) 03/19/2023  A1C today = 5.9

## 2023-08-27 LAB — HM DIABETES EYE EXAM

## 2023-09-19 ENCOUNTER — Other Ambulatory Visit: Payer: Self-pay | Admitting: Internal Medicine

## 2023-09-19 NOTE — Telephone Encounter (Signed)
Medication Refill - Medication: KLOR-CON M20 20 MEQ tablet   Has the patient contacted their pharmacy? No. (Agent: If no, request that the patient contact the pharmacy for the refill. If patient does not wish to contact the pharmacy document the reason why and proceed with request.) (Agent: If yes, when and what did the pharmacy advise?)  Preferred Pharmacy (with phone number or street name):  [id number] - [place], [place] - [address] AT [address] Phone: [phone]  Fax: [phone]     Has the patient been seen for an appointment in the last year OR does the patient have an upcoming appointment? Yes.    Agent: Please be advised that RX refills may take up to 3 business days. We ask that you follow-up with your pharmacy.

## 2023-09-19 NOTE — Telephone Encounter (Signed)
Please review.  KP

## 2023-09-19 NOTE — Telephone Encounter (Signed)
Requested medication (s) are due for refill today: routing for review  Requested medication (s) are on the active medication list: yes  Last refill:  06/08/15  Future visit scheduled: yes  Notes to clinic:  Unable to refill per protocol, last refill by historical provider.      Requested Prescriptions  Pending Prescriptions Disp Refills   KLOR-CON M20 20 MEQ tablet  11    Sig: Take 1 tablet (20 mEq total) by mouth daily.     Endocrinology:  Minerals - Potassium Supplementation Passed - 09/19/2023  9:09 AM      Passed - K in normal range and within 360 days    Potassium  Date Value Ref Range Status  03/19/2023 4.0 3.5 - 5.2 mmol/L Final         Passed - Cr in normal range and within 360 days    Creatinine, Ser  Date Value Ref Range Status  03/19/2023 0.98 0.57 - 1.00 mg/dL Final         Passed - Valid encounter within last 12 months    Recent Outpatient Visits           1 month ago Essential (primary) hypertension   [place] [facility] at [facility], Nyoka Cowden, MD   6 months ago Annual physical exam   [facility] at [facility], Nyoka Cowden, MD   10 months ago Type II diabetes mellitus with complication [facility])   [place] [facility] at [facility], Nyoka Cowden, MD   1 year ago Neuropathic pain, leg, right   [facility] at [facility], Nyoka Cowden, MD   1 year ago Type II diabetes mellitus with complication [facility])   [place] [facility] at [facility], Nyoka Cowden, MD       Future Appointments             In 2 months Judithann Graves, Nyoka Cowden, MD [facility] at [facility], [facility]

## 2023-10-02 ENCOUNTER — Ambulatory Visit
Admission: RE | Admit: 2023-10-02 | Discharge: 2023-10-02 | Disposition: A | Discharge status: Home / Self Care | Payer: Medicare Other | Attending: Internal Medicine | Admitting: Internal Medicine

## 2023-10-02 ENCOUNTER — Other Ambulatory Visit: Payer: Self-pay | Admitting: Internal Medicine

## 2023-10-02 ENCOUNTER — Ambulatory Visit: Payer: Medicare Other | Admitting: Internal Medicine

## 2023-10-02 ENCOUNTER — Encounter: Payer: Self-pay | Admitting: Internal Medicine

## 2023-10-02 ENCOUNTER — Ambulatory Visit
Admission: RE | Admit: 2023-10-02 | Discharge: 2023-10-02 | Disposition: A | Discharge status: Home / Self Care | Payer: Medicare Other | Source: Ambulatory Visit | Attending: Internal Medicine | Admitting: Internal Medicine

## 2023-10-02 ENCOUNTER — Ambulatory Visit: Payer: BC Managed Care – PPO

## 2023-10-02 ENCOUNTER — Telehealth: Payer: Self-pay | Admitting: Internal Medicine

## 2023-10-02 VITALS — BP 124/78 | HR 79 | Ht 63.0 in | Wt 200.6 lb

## 2023-10-02 DIAGNOSIS — M25561 Pain in right knee: Secondary | ICD-10-CM

## 2023-10-02 DIAGNOSIS — T2220XA Burn of second degree of shoulder and upper limb, except wrist and hand, unspecified site, initial encounter: Secondary | ICD-10-CM | POA: Diagnosis not present

## 2023-10-02 DIAGNOSIS — L03115 Cellulitis of right lower limb: Secondary | ICD-10-CM | POA: Diagnosis not present

## 2023-10-02 DIAGNOSIS — S81831A Puncture wound without foreign body, right lower leg, initial encounter: Secondary | ICD-10-CM

## 2023-10-02 DIAGNOSIS — M1711 Unilateral primary osteoarthritis, right knee: Secondary | ICD-10-CM

## 2023-10-02 MED ORDER — MELOXICAM 15 MG PO TABS: 15.0000 mg | ORAL_TABLET | Freq: Every day | ORAL | 0 refills | Status: DC

## 2023-10-02 MED ORDER — DOXYCYCLINE HYCLATE 100 MG PO TABS: 100.0000 mg | ORAL_TABLET | Freq: Two times a day (BID) | ORAL | 0 refills | Status: AC
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
## 2023-10-02 NOTE — Progress Notes (Signed)
Date:  10/02/2023   Name:  Laura Adkins   DOB:  1956-06-29   MRN:  [id number]   Chief Complaint: Wound Infection (Right lower leg check.X 2 weeks. Patient walked into the woods and fell. Draining clear fluid from small wound on shin.)  HPI  Review of Systems   Lab Results  Component Value Date   NA 140 03/19/2023   K 4.0 03/19/2023   CO2 25 03/19/2023   GLUCOSE 114 (H) 03/19/2023   BUN 12 03/19/2023   CREATININE 0.98 03/19/2023   CALCIUM 9.7 03/19/2023   EGFR 64 03/19/2023   GFRNONAA 72 03/11/2020   Lab Results  Component Value Date   CHOL 151 03/19/2023   HDL 67 03/19/2023   LDLCALC 66 03/19/2023   TRIG 101 03/19/2023   CHOLHDL 2.3 03/19/2023   Lab Results  Component Value Date   TSH 2.400 03/19/2023   Lab Results  Component Value Date   HGBA1C 5.9 (A) 07/23/2023   Lab Results  Component Value Date   WBC 5.3 03/19/2023   HGB 11.1 03/19/2023   HCT 35.5 03/19/2023   MCV 83 03/19/2023   PLT 253 03/19/2023   Lab Results  Component Value Date   ALT 24 03/19/2023   AST 20 03/19/2023   ALKPHOS 107 03/19/2023   BILITOT 0.3 03/19/2023   No results found for: "25OHVITD2", "25OHVITD3", "VD25OH"   Patient Active Problem List   Diagnosis Date Noted  . Diabetic polyneuropathy associated with type 2 diabetes mellitus (HCC) 11/22/2022  . (HFpEF) heart failure with preserved ejection fraction (HCC) 08/16/2022  . Localized edema 05/01/2019  . Hyperlipidemia associated with type 2 diabetes mellitus (HCC) 11/06/2017  . Low grade squamous intraepithelial lesion (LGSIL) on Papanicolaou smear of cervix 02/06/2016  . Type II diabetes mellitus with complication (HCC) 10/10/2015  . Coronary artery disease of native artery of native heart with stable angina pectoris (HCC) 10/10/2015  . Fibrocystic breast 05/05/2015  . Essential (primary) hypertension 05/05/2015  . Migraine without aura and responsive to treatment 05/05/2015  . OSA on CPAP 05/05/2015  .  Arthritis of knee, degenerative 05/05/2015  . Posterior tibial tendinitis 05/05/2015    Allergies  Allergen Reactions  . Ace Inhibitors Other (See Comments)  . Orange Oil Other (See Comments)    Unable to tolerate it.   . Celebrex  [Celecoxib] Rash    Past Surgical History:  Procedure Laterality Date  . BREAST SURGERY  05/1997   necrosis  . COLONOSCOPY  04/2008  . COLONOSCOPY  04/2018   normal - repeat 10 yrs    Social History   Tobacco Use  . Smoking status: Never  . Smokeless tobacco: Never  Vaping Use  . Vaping status: Never Used  Substance Use Topics  . Alcohol use: No    Alcohol/week: 0.0 standard drinks of alcohol  . Drug use: No     Medication list has been reviewed and updated.  Current Meds  Medication Sig  . amLODipine (NORVASC) 5 MG tablet Take 1 tablet (5 mg total) by mouth daily.  Marland Kitchen aspirin 81 MG chewable tablet Chew 1 tablet by mouth daily.  Marland Kitchen atorvastatin (LIPITOR) 80 MG tablet TAKE 1 TABLET BY MOUTH EVERY DAY  . Blood Glucose Monitoring Suppl (ONE TOUCH ULTRA SYSTEM KIT) w/Device KIT 1 kit by Does not apply route once.  . Calcium Carbonate-Vit D-Min (CALTRATE 600+D PLUS MINERALS) 600-800 MG-UNIT TABS Take 1 tablet by mouth daily.  Marland Kitchen glucose blood (ONETOUCH ULTRA) test strip  USE TO TEST BLOOD SUGAR TWICE DAILY.  Marland Kitchen KLOR-CON M20 20 MEQ tablet Take 1 tablet by mouth daily.  . Lancets (ONETOUCH DELICA PLUS LANCET33G) MISC APPLY 1 EACH TOPICALLY DAILY. AS DIRECTED  . losartan (COZAAR) 100 MG tablet TAKE 1 TABLET BY MOUTH EVERY DAY  . metFORMIN (GLUCOPHAGE) 500 MG tablet TAKE 2 TABLETS BY MOUTH EVERY DAY  . Multiple Vitamins tablet Take by mouth.  . nystatin cream (MYCOSTATIN) APPLY 1 APPLICATION DAILY TO RASH UNDER BREAST  . torsemide (DEMADEX) 20 MG tablet Take 1 tablet (20 mg total) by mouth daily.  Marland Kitchen triamcinolone (KENALOG) 0.1 % APPLY TO AFFECTED AREA TWICE A DAY       10/02/2023   11:18 AM 07/23/2023    4:11 PM 03/19/2023    9:10 AM 11/22/2022     9:33 AM  GAD 7 : Generalized Anxiety Score  Nervous, Anxious, on Edge 0 1 0 0  Control/stop worrying 0 0 0 0  Worry too much - different things 0 1 0 1  Trouble relaxing 0 0 0 0  Restless 0 0 0 0  Easily annoyed or irritable 0 1 0 1  Afraid - awful might happen 0 0 0 0  Total GAD 7 Score 0 3 0 2  Anxiety Difficulty Not difficult at all Not difficult at all Not difficult at all Not difficult at all       10/02/2023   11:18 AM 07/23/2023    4:11 PM 04/17/2023   10:22 AM  Depression screen PHQ 2/9  Decreased Interest 0 0 0  Down, Depressed, Hopeless 0 0 0  PHQ - 2 Score 0 0 0  Altered sleeping 0 0 0  Tired, decreased energy 0 0 0  Change in appetite 0 0 0  Feeling bad or failure about yourself  0 0 0  Trouble concentrating 0 0 0  Moving slowly or fidgety/restless 0 0 0  Suicidal thoughts 0 0 0  PHQ-9 Score 0 0 0  Difficult doing work/chores Not difficult at all Not difficult at all Not difficult at all    BP Readings from Last 3 Encounters:  10/02/23 124/78  07/23/23 126/68  04/17/23 124/70    Physical Exam  Wt Readings from Last 3 Encounters:  10/02/23 200 lb 9.6 oz (91 kg)  07/23/23 196 lb 6.4 oz (89.1 kg)  04/17/23 200 lb 9.6 oz (91 kg)    BP 124/78   Pulse 79   Ht 5\' 3"  (1.6 m)   Wt 200 lb 9.6 oz (91 kg)   SpO2 97%   BMI 35.53 kg/m   Assessment and Plan:  Problem List Items Addressed This Visit   None   No follow-ups on file.    Reubin Milan, MD [facility] [place]

## 2023-10-02 NOTE — Telephone Encounter (Signed)
Please get eye exam from October from her eye doctor of record.

## 2023-10-02 NOTE — Patient Instructions (Addendum)
Elevate legs to reduce swelling.  Take all the antibiotics unless there are side effects.  Call [facility] to schedule your Mammogram at [phone].  It is due in February.

## 2023-10-03 NOTE — Telephone Encounter (Signed)
Eye exam requested.  KP

## 2023-10-03 NOTE — Progress Notes (Signed)
Called pt left VM to call back.  PEC may give results if patient returns call - CRM created.  KP

## 2023-10-07 ENCOUNTER — Ambulatory Visit: Payer: Self-pay | Admitting: *Deleted

## 2023-10-07 NOTE — Telephone Encounter (Signed)
Summary: Medicaiton questions   Pt called and has questions about her medications and potential interactions. Her cardiologist has given her different directions  Best contact: [phone] or cell     Reason for Disposition . [1] Caller has medicine question about med NOT prescribed by PCP AND [2] triager unable to answer question (e.g., compatibility with other med, storage)  Answer Assessment - Initial Assessment Questions 1. NAME of MEDICINE: "What medicine(s) are you calling about?"     Patient has medication questions- patient has concerns about changing her medications- patient she is taking antibiotic -she wants to start the Meloxicam- but she wants PCP to verify that she can take with the aspirin. ( Patient figures it is OK- she wants to make sure it is OK)  K+ has been changed to every other day Patient has instructions for end of December- patient would like PCP to review and advised- make sure patient is clear about what she needs to do.  Protocols used: Medication Question Call-A-AH

## 2023-10-07 NOTE — Telephone Encounter (Signed)
  Chief Complaint: Medication question- patient has questions about recent medication changes  Symptoms: pulse has been low- under 50- cardiologist has recommended changes- please review changes recommended   Disposition: [] ED /[] Urgent Care (no appt availability in office) / [] Appointment(In office/virtual)/ []  [place] [facility]/ [] Home Care/ [] Refused Recommended Disposition /[] [place] [facility]/ [x]  Follow-up with PCP Additional Notes: Patient has short term medications Rx'd by PCP that she is taking- she just wanted to verify that Meloxicam and low dose ASA were safe to take together- advised short term treatment- PCP aware- yes-  will have provider review again. Patient did go to cardiologist and states PCP should be getting letter- with recommended changes- she would like PCP to review and let her know if she agrees that this will be helpful. Potassium has been decreased to every other day- and other changes are recommended for end of December

## 2023-10-08 ENCOUNTER — Encounter: Payer: Self-pay | Admitting: Internal Medicine

## 2023-11-25 ENCOUNTER — Ambulatory Visit (INDEPENDENT_AMBULATORY_CARE_PROVIDER_SITE_OTHER): Payer: 59 | Admitting: Internal Medicine

## 2023-11-25 ENCOUNTER — Encounter: Payer: Self-pay | Admitting: Internal Medicine

## 2023-11-25 VITALS — BP 128/79 | HR 76 | Temp 97.5°F | Ht 63.0 in | Wt 194.4 lb

## 2023-11-25 DIAGNOSIS — I10 Essential (primary) hypertension: Secondary | ICD-10-CM | POA: Diagnosis not present

## 2023-11-25 DIAGNOSIS — Z7984 Long term (current) use of oral hypoglycemic drugs: Secondary | ICD-10-CM

## 2023-11-25 DIAGNOSIS — I5032 Chronic diastolic (congestive) heart failure: Secondary | ICD-10-CM | POA: Diagnosis not present

## 2023-11-25 DIAGNOSIS — E118 Type 2 diabetes mellitus with unspecified complications: Secondary | ICD-10-CM

## 2023-11-25 LAB — POCT GLYCOSYLATED HEMOGLOBIN (HGB A1C): Hemoglobin A1C: 6.4 % — AB (ref 4.0–5.6)

## 2023-11-25 NOTE — Assessment & Plan Note (Signed)
 Recently seen by Cardiology = amlodipine stopped and spironolactone added (she has not started this yet) She continues on lasix and losartan

## 2023-11-25 NOTE — Assessment & Plan Note (Addendum)
 Blood sugars stable without hypoglycemic symptoms or events. Currently managed with MTF. Changes made last visit are none. Lab Results  Component Value Date   HGBA1C 5.9 (A) 07/23/2023  A1C today = 6.4

## 2023-11-25 NOTE — Assessment & Plan Note (Addendum)
 Elevated BP today due to illness and decongestant use. Improved with re-check. Advised to avoid sudafed; can use Coricidin HBP instead

## 2023-11-25 NOTE — Patient Instructions (Signed)
 Xyzal 5 mg once a day for nasal drip. (Samples)  For congestion:  Coricidin HBP as instructed on the package

## 2023-11-25 NOTE — Progress Notes (Signed)
 Date:  11/25/2023   Name:  Laura Adkins   DOB:  07-22-1956   MRN:  [id number]   Chief Complaint: Diabetes and Hypertension  Diabetes She presents for her follow-up diabetic visit. She has type 2 diabetes mellitus. Her disease course has been stable. Pertinent negatives for hypoglycemia include no dizziness, headaches or nervousness/anxiousness. Pertinent negatives for diabetes include no chest pain.  Hypertension This is a chronic problem. The problem is controlled. Pertinent negatives include no chest pain, headaches or shortness of breath. Past treatments include angiotensin blockers (amlodipine  stopped by Cardiology). The current treatment provides significant improvement. Hypertensive end-organ damage includes heart failure.  URI  This is a new problem. The current episode started yesterday. The problem has been unchanged. There has been no fever. Associated symptoms include rhinorrhea. Pertinent negatives include no abdominal pain, chest pain, headaches or wheezing. She has tried decongestant for the symptoms. The treatment provided mild relief.    Review of Systems  Constitutional:  Negative for chills and fever.  HENT:  Positive for rhinorrhea. Negative for trouble swallowing.   Respiratory:  Negative for chest tightness, shortness of breath and wheezing.   Cardiovascular:  Negative for chest pain.  Gastrointestinal:  Negative for abdominal pain.  Neurological:  Negative for dizziness and headaches.  Psychiatric/Behavioral:  Negative for dysphoric mood and sleep disturbance. The patient is not nervous/anxious.      Lab Results  Component Value Date   NA 140 03/19/2023   K 4.0 03/19/2023   CO2 25 03/19/2023   GLUCOSE 114 (H) 03/19/2023   BUN 12 03/19/2023   CREATININE 0.98 03/19/2023   CALCIUM  9.7 03/19/2023   EGFR 64 03/19/2023   GFRNONAA 72 03/11/2020   Lab Results  Component Value Date   CHOL 151 03/19/2023   HDL 67 03/19/2023   LDLCALC 66 03/19/2023    TRIG 101 03/19/2023   CHOLHDL 2.3 03/19/2023   Lab Results  Component Value Date   TSH 2.400 03/19/2023   Lab Results  Component Value Date   HGBA1C 6.4 (A) 11/25/2023   Lab Results  Component Value Date   WBC 5.3 03/19/2023   HGB 11.1 03/19/2023   HCT 35.5 03/19/2023   MCV 83 03/19/2023   PLT 253 03/19/2023   Lab Results  Component Value Date   ALT 24 03/19/2023   AST 20 03/19/2023   ALKPHOS 107 03/19/2023   BILITOT 0.3 03/19/2023   No results found for: MARIEN BOLLS, VD25OH   Patient Active Problem List   Diagnosis Date Noted   Diabetic polyneuropathy associated with type 2 diabetes mellitus (HCC) 11/22/2022   (HFpEF) heart failure with preserved ejection fraction (HCC) 08/16/2022   Localized edema 05/01/2019   Hyperlipidemia associated with type 2 diabetes mellitus (HCC) 11/06/2017   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    Celebrex  [Celecoxib] Rash    Past Surgical History:  Procedure Laterality Date  BREAST SURGERY  05/1997   necrosis   COLONOSCOPY  04/2008   COLONOSCOPY  04/2018   normal - repeat 10 yrs    Social History   Tobacco Use   Smoking status: Never   Smokeless tobacco: Never  Vaping Use   Vaping status: Never Used  Substance Use Topics   Alcohol use: No    Alcohol/week: 0.0 standard drinks of alcohol   Drug use: No      Medication list has been reviewed and updated.  Current Meds  Medication Sig   aspirin 81 MG chewable tablet Chew 1 tablet by mouth daily.   atorvastatin  (LIPITOR) 80 MG tablet TAKE 1 TABLET BY MOUTH EVERY DAY   Blood Glucose Monitoring Suppl (ONE TOUCH ULTRA SYSTEM KIT) w/Device KIT 1 kit by Does not apply route once.   Calcium  Carbonate-Vit D-Min (CALTRATE 600+D PLUS MINERALS) 600-800 MG-UNIT TABS Take 1 tablet by mouth daily.   glucose blood (ONETOUCH ULTRA) test strip USE TO TEST BLOOD SUGAR TWICE DAILY.   KLOR-CON M20 20 MEQ tablet Take 1 tablet by mouth every other day.   Lancets (ONETOUCH DELICA PLUS LANCET33G) MISC APPLY 1 EACH TOPICALLY DAILY. AS DIRECTED   losartan  (COZAAR ) 100 MG tablet TAKE 1 TABLET BY MOUTH EVERY DAY   meloxicam  (MOBIC ) 15 MG tablet Take 1 tablet (15 mg total) by mouth daily.   metFORMIN  (GLUCOPHAGE ) 500 MG tablet TAKE 2 TABLETS BY MOUTH EVERY DAY   Multiple Vitamins tablet Take by mouth.   nystatin  cream (MYCOSTATIN ) APPLY 1 APPLICATION DAILY TO RASH UNDER BREAST   spironolactone (ALDACTONE) 25 MG tablet Take 25 mg by mouth daily.   triamcinolone  (KENALOG ) 0.1 % APPLY TO AFFECTED AREA TWICE A DAY   [DISCONTINUED] amLODipine  (NORVASC ) 5 MG tablet Take 1 tablet (5 mg total) by mouth daily.   [DISCONTINUED] torsemide  (DEMADEX ) 20 MG tablet Take 1 tablet (20 mg total) by mouth daily.       10/02/2023   11:18 AM 07/23/2023    4:11 PM 03/19/2023    9:10 AM 11/22/2022    9:33 AM  GAD 7 : Generalized Anxiety Score  Nervous, Anxious, on Edge 0 1 0 0  Control/stop worrying 0 0 0 0  Worry too much - different things 0 1 0 1  Trouble relaxing 0 0 0 0  Restless 0 0 0 0  Easily annoyed or irritable 0 1 0 1  Afraid - awful might happen 0 0 0 0  Total GAD 7 Score 0 3 0 2  Anxiety Difficulty Not difficult at all Not difficult at all Not difficult at all Not difficult at all       10/02/2023   11:18 AM 07/23/2023    4:11 PM 04/17/2023   10:22 AM  Depression  screen PHQ 2/9  Decreased Interest 0 0 0  Down, Depressed, Hopeless 0 0 0  PHQ - 2 Score 0 0 0  Altered sleeping 0 0 0  Tired, decreased energy 0 0 0  Change in appetite 0 0 0  Feeling bad or failure about yourself  0 0 0  Trouble concentrating 0 0 0  Moving slowly or fidgety/restless 0 0 0  Suicidal thoughts 0 0 0  PHQ-9 Score 0 0 0  Difficult doing work/chores Not difficult at all Not difficult at all Not difficult at all    BP Readings from Last 3 Encounters:  11/25/23 128/79  10/02/23 124/78  07/23/23 126/68    Physical Exam Vitals and nursing  note reviewed.  Constitutional:      General: She is not in acute distress.    Appearance: Normal appearance. She is well-developed.  HENT:     Head: Normocephalic and atraumatic.     Right Ear: Decreased hearing noted.     Left Ear: Decreased hearing noted.     Nose:     Right Sinus: No maxillary sinus tenderness or frontal sinus tenderness.     Left Sinus: No maxillary sinus tenderness or frontal sinus tenderness.     Mouth/Throat:     Pharynx: Oropharynx is clear.  Neck:     Vascular: No carotid bruit.  Cardiovascular:     Rate and Rhythm: Normal rate and regular rhythm.  Pulmonary:     Effort: Pulmonary effort is normal. No respiratory distress.     Breath sounds: No wheezing or rhonchi.  Musculoskeletal:     Cervical back: Normal range of motion.  Lymphadenopathy:     Cervical: No cervical adenopathy.  Skin:    General: Skin is warm and dry.     Findings: No rash.  Neurological:     General: No focal deficit present.     Mental Status: She is alert and oriented to person, place, and time.  Psychiatric:        Mood and Affect: Mood normal.        Behavior: Behavior normal.     Wt Readings from Last 3 Encounters:  11/25/23 194 lb 6.4 oz (88.2 kg)  10/02/23 200 lb 9.6 oz (91 kg)  07/23/23 196 lb 6.4 oz (89.1 kg)    BP 128/79 (BP Location: Left Arm, Cuff Size: Large)   Pulse 76   Temp (!) 97.5 F (36.4 C)  (Oral)   Ht 5' 3 (1.6 m)   Wt 194 lb 6.4 oz (88.2 kg)   SpO2 97%   BMI 34.44 kg/m   Assessment and Plan:  Problem List Items Addressed This Visit       Unprioritized   Essential (primary) hypertension (Chronic)   Elevated BP today due to illness and decongestant use. Improved with re-check. Advised to avoid sudafed; can use Coricidin HBP instead      Relevant Medications   spironolactone (ALDACTONE) 25 MG tablet   Type II diabetes mellitus with complication (HCC) - Primary (Chronic)   Blood sugars stable without hypoglycemic symptoms or events. Currently managed with MTF. Changes made last visit are none. Lab Results  Component Value Date   HGBA1C 5.9 (A) 07/23/2023  A1C today = 6.4       Relevant Orders   POCT glycosylated hemoglobin (Hb A1C) (Completed)   (HFpEF) heart failure with preserved ejection fraction (HCC)   Recently seen by Cardiology = amlodipine  stopped and spironolactone added (she has not started this yet) She continues on lasix and losartan       Relevant Medications   spironolactone (ALDACTONE) 25 MG tablet   Other Relevant Orders   Basic metabolic panel   Other Visit Diagnoses       Long term current use of oral hypoglycemic drug           Return in about 4 months (around 03/24/2024) for CPX.    Leita HILARIO Adie, MD [facility] [place]

## 2023-12-26 ENCOUNTER — Other Ambulatory Visit: Payer: Self-pay

## 2023-12-26 DIAGNOSIS — I5032 Chronic diastolic (congestive) heart failure: Secondary | ICD-10-CM

## 2023-12-26 DIAGNOSIS — Z780 Asymptomatic menopausal state: Secondary | ICD-10-CM

## 2023-12-27 LAB — BASIC METABOLIC PANEL
BUN/Creatinine Ratio: 15 (ref 12–28)
BUN: 16 mg/dL (ref 8–27)
CO2: 26 mmol/L (ref 20–29)
Calcium: 9.7 mg/dL (ref 8.7–10.3)
Chloride: 100 mmol/L (ref 96–106)
Creatinine, Ser: 1.09 mg/dL — ABNORMAL HIGH (ref 0.57–1.00)
Glucose: 120 mg/dL — ABNORMAL HIGH (ref 70–99)
Potassium: 4 mmol/L (ref 3.5–5.2)
Sodium: 143 mmol/L (ref 134–144)
eGFR: 56 mL/min/{1.73_m2} — ABNORMAL LOW (ref >59)

## 2024-01-06 LAB — HM MAMMOGRAPHY

## 2024-01-07 ENCOUNTER — Encounter: Payer: Self-pay | Admitting: Internal Medicine

## 2024-01-10 ENCOUNTER — Telehealth: Payer: Self-pay

## 2024-01-10 NOTE — Telephone Encounter (Signed)
 Copied from CRM [id number]. Topic: General - Other >> Jan 10, 2024  9:58 AM Antony Haste wrote: Reason for CRM: Tracie from [facility] states an order will be needed for this patient's upcoming exam. Lambert Mody states she will have a right wrist diagnostic mammogram and ultrasound.  Fax #[phone] Callback #: [phone]

## 2024-01-15 ENCOUNTER — Encounter: Payer: Self-pay | Admitting: Internal Medicine

## 2024-02-10 ENCOUNTER — Other Ambulatory Visit: Payer: Self-pay | Admitting: Internal Medicine

## 2024-02-11 NOTE — Telephone Encounter (Signed)
 Requested Prescriptions  Pending Prescriptions Disp Refills   ONETOUCH ULTRA test strip [Pharmacy Med Name: ONE TOUCH ULTRA BLUE TEST STRP] 100 strip 6    Sig: USE TO TEST BLOOD SUGAR TWICE DAILY     Endocrinology: Diabetes - Testing Supplies Passed - 02/11/2024  4:56 PM      Passed - Valid encounter within last 12 months    Recent Outpatient Visits           2 months ago Type II diabetes mellitus with complication [facility])   [place] [facility] at [facility], Nyoka Cowden, MD   4 months ago Acute pain of right knee   [facility] at [facility], Nyoka Cowden, MD   6 months ago Essential (primary) hypertension   [place] [facility] at [facility], Nyoka Cowden, MD   10 months ago Annual physical exam   [facility] at [facility], Nyoka Cowden, MD   1 year ago Type II diabetes mellitus with complication [facility])   [place] [facility] at [facility], Nyoka Cowden, MD       Future Appointments             In 1 month Judithann Graves, Nyoka Cowden, MD [facility] at [facility], [facility]

## 2024-03-31 ENCOUNTER — Ambulatory Visit (INDEPENDENT_AMBULATORY_CARE_PROVIDER_SITE_OTHER): Payer: Medicare Other | Admitting: Internal Medicine

## 2024-03-31 ENCOUNTER — Encounter: Payer: Self-pay | Admitting: Internal Medicine

## 2024-03-31 VITALS — BP 124/80 | HR 53 | Ht 63.0 in | Wt 195.0 lb

## 2024-03-31 DIAGNOSIS — Z0000 Encounter for general adult medical examination without abnormal findings: Secondary | ICD-10-CM | POA: Diagnosis not present

## 2024-03-31 DIAGNOSIS — E785 Hyperlipidemia, unspecified: Secondary | ICD-10-CM

## 2024-03-31 DIAGNOSIS — I25118 Atherosclerotic heart disease of native coronary artery with other forms of angina pectoris: Secondary | ICD-10-CM

## 2024-03-31 DIAGNOSIS — E118 Type 2 diabetes mellitus with unspecified complications: Secondary | ICD-10-CM | POA: Diagnosis not present

## 2024-03-31 DIAGNOSIS — E1169 Type 2 diabetes mellitus with other specified complication: Secondary | ICD-10-CM | POA: Diagnosis not present

## 2024-03-31 DIAGNOSIS — I10 Essential (primary) hypertension: Secondary | ICD-10-CM

## 2024-03-31 DIAGNOSIS — Z7984 Long term (current) use of oral hypoglycemic drugs: Secondary | ICD-10-CM

## 2024-03-31 DIAGNOSIS — G4733 Obstructive sleep apnea (adult) (pediatric): Secondary | ICD-10-CM

## 2024-03-31 MED ORDER — ATORVASTATIN CALCIUM 80 MG PO TABS
80.0000 mg | ORAL_TABLET | Freq: Every day | ORAL | 3 refills | Status: DC
Start: 2024-03-31 — End: 2024-05-14

## 2024-03-31 MED ORDER — LOSARTAN POTASSIUM 100 MG PO TABS: 100.0000 mg | ORAL_TABLET | Freq: Every day | ORAL | 3 refills | Status: DC

## 2024-03-31 MED ORDER — METFORMIN HCL 500 MG PO TABS: 1000.0000 mg | ORAL_TABLET | Freq: Every day | ORAL | 3 refills | Status: DC

## 2024-03-31 NOTE — Patient Instructions (Signed)
 Call [facility] to schedule your bone density test in [place] at [phone].   Take your CPAP machine and supplies to [facility] to see if they can help you.

## 2024-03-31 NOTE — Assessment & Plan Note (Signed)
 Stable BP with no angina or anginal equivalent. No use of NTG. No indication at this time for stress testing.

## 2024-03-31 NOTE — Progress Notes (Signed)
 Date:  03/31/2024   Name:  Laura Adkins   DOB:  Sep 20, 1956   MRN:  [id number]   Chief Complaint: Annual Exam and Fatigue (Patient feels like she does not have as much energy as she used to have, under a lot of stress at the moment) Laura Adkins is a 68 y.o. female who presents today for her Complete Annual Exam. She feels well. She reports exercising walks, 45 minutes to 1 hour, everyday . She reports she is sleeping poorly. Breast complaints none.  Health Maintenance  Topic Date Due   DEXA scan (bone density measurement)  Never done   COVID-19 Vaccine (6 - 2024-25 season) 02/20/2024   Yearly kidney health urinalysis for diabetes  03/18/2024   Medicare Annual Wellness Visit  04/16/2024   Hemoglobin A1C  05/24/2024   Flu Shot  06/19/2024   Eye exam for diabetics  08/26/2024   Yearly kidney function blood test for diabetes  12/25/2024   Mammogram  01/05/2025   Complete foot exam   03/31/2025   DTaP/Tdap/Td vaccine (3 - Td or Tdap) 02/05/2026   Colon Cancer Screening  05/08/2028   Pneumonia Vaccine  Completed   Hepatitis C Screening  Completed   Zoster (Shingles) Vaccine  Completed   HPV Vaccine  Aged Out   Meningitis B Vaccine  Aged Out    Hypertension This is a chronic problem. The problem is controlled. Pertinent negatives include no chest pain, headaches, palpitations or shortness of breath. Past treatments include angiotensin blockers. The current treatment provides significant improvement. Hypertensive end-organ damage includes kidney disease and CAD/MI. There is no history of CVA.  Diabetes She presents for her follow-up diabetic visit. She has type 2 diabetes mellitus. Her disease course has been stable. Pertinent negatives for hypoglycemia include no dizziness, headaches or nervousness/anxiousness. Pertinent negatives for diabetes include no chest pain, no fatigue and no weakness. Pertinent negatives for diabetic complications include no CVA.   Hyperlipidemia This is a chronic problem. The problem is controlled. Associated symptoms include myalgias (intermittent muscle cramps). Pertinent negatives include no chest pain or shortness of breath. Current antihyperlipidemic treatment includes statins. The current treatment provides significant improvement of lipids.  OSA - she is not using her CPAP - she thinks it is not working correctly.  She was using it as needed (unclear what that means) but now not at all.  She reports fatigue, lack of motivation, irritability and non refreshing sleep.  She continues to exercise most days and denies chest pain or DOE. CAD - seen by cardiology in March, no medication changes made.  Plan for annual follow up. She denies chest pain, shortness of breath or DOE.   Review of Systems  Constitutional:  Negative for fatigue and unexpected weight change.  HENT:  Negative for trouble swallowing.   Eyes:  Negative for visual disturbance.  Respiratory:  Negative for cough, chest tightness, shortness of breath and wheezing.   Cardiovascular:  Negative for chest pain, palpitations and leg swelling.  Gastrointestinal:  Negative for abdominal pain, constipation and diarrhea.  Genitourinary:  Negative for dysuria and urgency.  Musculoskeletal:  Positive for myalgias (intermittent muscle cramps). Negative for arthralgias.  Neurological:  Negative for dizziness, weakness, light-headedness and headaches.  Psychiatric/Behavioral:  Positive for dysphoric mood. Negative for sleep disturbance. The patient is not nervous/anxious.      Lab Results  Component Value Date   NA 143 12/26/2023   K 4.0 12/26/2023   CO2 26 12/26/2023   GLUCOSE  120 (H) 12/26/2023   BUN 16 12/26/2023   CREATININE 1.09 (H) 12/26/2023   CALCIUM  9.7 12/26/2023   EGFR 56 (L) 12/26/2023   GFRNONAA 72 03/11/2020   Lab Results  Component Value Date   CHOL 151 03/19/2023   HDL 67 03/19/2023   LDLCALC 66 03/19/2023   TRIG 101 03/19/2023    CHOLHDL 2.3 03/19/2023   Lab Results  Component Value Date   TSH 2.400 03/19/2023   Lab Results  Component Value Date   HGBA1C 6.4 (A) 11/25/2023   Lab Results  Component Value Date   WBC 5.3 03/19/2023   HGB 11.1 03/19/2023   HCT 35.5 03/19/2023   MCV 83 03/19/2023   PLT 253 03/19/2023   Lab Results  Component Value Date   ALT 24 03/19/2023   AST 20 03/19/2023   ALKPHOS 107 03/19/2023   BILITOT 0.3 03/19/2023   No results found for: "25OHVITD2", "25OHVITD3", "VD25OH"   Patient Active Problem List   Diagnosis Date Noted   Diabetic polyneuropathy associated with type 2 diabetes mellitus (HCC) 11/22/2022   (HFpEF) heart failure with preserved ejection fraction (HCC) 08/16/2022   Localized edema 05/01/2019   Hyperlipidemia associated with type 2 diabetes mellitus (HCC) 11/06/2017   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    Celebrex  [Celecoxib] Rash    Past Surgical History:  Procedure Laterality Date   BREAST SURGERY  05/1997   necrosis   COLONOSCOPY  04/2008   COLONOSCOPY  04/2018   normal - repeat 10 yrs    Social History   Tobacco Use   Smoking status: Never   Smokeless tobacco: Never  Vaping Use   Vaping status: Never Used  Substance Use Topics   Alcohol use: No    Alcohol/week: 0.0 standard drinks of alcohol   Drug use: No     Medication list has  been reviewed and updated.  Current Meds  Medication Sig   aspirin 81 MG chewable tablet Chew 1 tablet by mouth daily.   Blood Glucose Monitoring Suppl (ONE TOUCH ULTRA SYSTEM KIT) w/Device KIT 1 kit by Does not apply route once.   Calcium  Carbonate-Vit D-Min (CALTRATE 600+D PLUS MINERALS) 600-800 MG-UNIT TABS Take 1 tablet by mouth daily.   glucose blood (ONETOUCH ULTRA) test strip USE TO TEST BLOOD SUGAR TWICE DAILY   KLOR-CON M20 20 MEQ tablet Take 1 tablet by mouth every other day.   Lancets (ONETOUCH DELICA PLUS LANCET33G) MISC APPLY 1 EACH TOPICALLY DAILY. AS DIRECTED   meloxicam  (MOBIC ) 15 MG tablet Take 1 tablet (15 mg total) by mouth daily.   nystatin  cream (MYCOSTATIN ) APPLY 1 APPLICATION DAILY TO RASH UNDER BREAST   spironolactone (ALDACTONE) 25 MG tablet Take 25 mg by mouth daily.   torsemide  (DEMADEX ) 20 MG tablet Take 20 mg by mouth daily.   triamcinolone  (KENALOG ) 0.1 % APPLY TO AFFECTED AREA TWICE A DAY   [DISCONTINUED]  atorvastatin  (LIPITOR) 80 MG tablet TAKE 1 TABLET BY MOUTH EVERY DAY   [DISCONTINUED] losartan  (COZAAR ) 100 MG tablet TAKE 1 TABLET BY MOUTH EVERY DAY   [DISCONTINUED] metFORMIN  (GLUCOPHAGE ) 500 MG tablet TAKE 2 TABLETS BY MOUTH EVERY DAY   [DISCONTINUED] Multiple Vitamins tablet Take by mouth.       03/31/2024   10:25 AM 10/02/2023   11:18 AM 07/23/2023    4:11 PM 03/19/2023    9:10 AM  GAD 7 : Generalized Anxiety Score  Nervous, Anxious, on Edge 1 0 1 0  Control/stop worrying 0 0 0 0  Worry too much - different things 0 0 1 0  Trouble relaxing 0 0 0 0  Restless 0 0 0 0  Easily annoyed or irritable 0 0 1 0  Afraid - awful might happen 0 0 0 0  Total GAD 7 Score 1 0 3 0  Anxiety Difficulty Not difficult at all Not difficult at all Not difficult at all Not difficult at all       03/31/2024   10:25 AM 10/02/2023   11:18 AM 07/23/2023    4:11 PM  Depression screen PHQ 2/9  Decreased Interest 1 0 0  Down, Depressed, Hopeless 0 0 0  PHQ - 2 Score 1 0  0  Altered sleeping 3 0 0  Tired, decreased energy 2 0 0  Change in appetite 0 0 0  Feeling bad or failure about yourself  0 0 0  Trouble concentrating 1 0 0  Moving slowly or fidgety/restless 0 0 0  Suicidal thoughts 0 0 0  PHQ-9 Score 7 0 0  Difficult doing work/chores Somewhat difficult Not difficult at all Not difficult at all    BP Readings from Last 3 Encounters:  03/31/24 124/80  11/25/23 128/79  10/02/23 124/78    Physical Exam Vitals and nursing note reviewed.  Constitutional:      General: She is not in acute distress.    Appearance: She is well-developed.  HENT:     Head: Normocephalic and atraumatic.     Right Ear: Tympanic membrane and ear canal normal.     Left Ear: Tympanic membrane and ear canal normal.     Nose:     Right Sinus: No maxillary sinus tenderness.     Left Sinus: No maxillary sinus tenderness.  Eyes:     General: No scleral icterus.       Right eye: No discharge.        Left eye: No discharge.     Conjunctiva/sclera: Conjunctivae normal.  Neck:     Thyroid: No thyromegaly.     Vascular: No carotid bruit.  Cardiovascular:     Rate and Rhythm: Normal rate and regular rhythm.     Pulses: Normal pulses.     Heart sounds: Normal heart sounds.  Pulmonary:     Effort: Pulmonary effort is normal. No respiratory distress.     Breath sounds: No wheezing.  Abdominal:     General: Bowel sounds are normal.     Palpations: Abdomen is soft.     Tenderness: There is no abdominal tenderness.  Musculoskeletal:     Cervical back: Normal range of motion. No erythema.     Right lower leg: No edema.     Left lower leg: No edema.  Lymphadenopathy:     Cervical: No cervical adenopathy.  Skin:    General: Skin is warm and dry.     Findings: No rash.  Neurological:  Mental Status: She is alert and oriented to person, place, and time.     Cranial Nerves: No cranial nerve deficit.     Sensory: No sensory deficit.     Deep Tendon Reflexes: Reflexes are  normal and symmetric.  Psychiatric:        Attention and Perception: Attention normal.        Mood and Affect: Mood normal. Affect is tearful.        Speech: Speech normal.        Behavior: Behavior normal.        Thought Content: Thought content does not include suicidal ideation. Thought content does not include suicidal plan.    Diabetic Foot Exam - Simple   Simple Foot Form Diabetic Foot exam was performed with the following findings: Yes 03/31/2024 10:14 AM  Visual Inspection No deformities, no ulcerations, no other skin breakdown bilaterally: Yes Sensation Testing Intact to touch and monofilament testing bilaterally: Yes Pulse Check Posterior Tibialis and Dorsalis pulse intact bilaterally: Yes Comments     Wt Readings from Last 3 Encounters:  03/31/24 195 lb (88.5 kg)  11/25/23 194 lb 6.4 oz (88.2 kg)  10/02/23 200 lb 9.6 oz (91 kg)    BP 124/80   Pulse (!) 53   Ht 5\' 3"  (1.6 m)   Wt 195 lb (88.5 kg)   SpO2 100%   BMI 34.54 kg/m   Assessment and Plan:  Problem List Items Addressed This Visit       Unprioritized   Essential (primary) hypertension (Chronic)   Blood pressure is well controlled.  Current medications are losartan , torsemide  and spironolactone. Will continue same regimen along with efforts to limit dietary sodium.        Relevant Medications   torsemide  (DEMADEX ) 20 MG tablet   losartan  (COZAAR ) 100 MG tablet   atorvastatin  (LIPITOR) 80 MG tablet   Other Relevant Orders   CBC with Differential/Platelet   Comprehensive metabolic panel with GFR   TSH   Type II diabetes mellitus with complication (HCC) (Chronic)   Blood sugars have been stable.  No recent hypoglycemic events requiring assistance. Currently medications are MTF. Lab Results  Component Value Date   HGBA1C 6.4 (A) 11/25/2023   Last visit no changes were made.       Relevant Medications   losartan  (COZAAR ) 100 MG tablet   atorvastatin  (LIPITOR) 80 MG tablet   metFORMIN   (GLUCOPHAGE ) 500 MG tablet   Other Relevant Orders   Hemoglobin A1c   Microalbumin / creatinine urine ratio   Coronary artery disease of native artery of native heart with stable angina pectoris (HCC) (Chronic)   Stable BP with no angina or anginal equivalent. No use of NTG. No indication at this time for stress testing.      Relevant Medications   torsemide  (DEMADEX ) 20 MG tablet   losartan  (COZAAR ) 100 MG tablet   atorvastatin  (LIPITOR) 80 MG tablet   Other Relevant Orders   CBC with Differential/Platelet   Comprehensive metabolic panel with GFR   Hyperlipidemia associated with type 2 diabetes mellitus (HCC) (Chronic)   LDL is  Lab Results  Component Value Date   LDLCALC 66 03/19/2023   Current regimen is atorvastatin .  No medication side effects noted. Goal LDL is <55.       Relevant Medications   torsemide  (DEMADEX ) 20 MG tablet   losartan  (COZAAR ) 100 MG tablet   atorvastatin  (LIPITOR) 80 MG tablet   metFORMIN  (GLUCOPHAGE ) 500 MG tablet  Other Relevant Orders   Lipid panel   OSA on CPAP   She is not using CPAP - feels like it was not working well. She complains of  fatigue and irritability - likely due to untreated OSA. I recommend that she follow up with [facility] to see if she needs a new machine or new sleep study.      Other Visit Diagnoses       Annual physical exam    -  Primary   up to date on screenings and immunizations will schedule DEXA continue healthy diet and regular exercise.     Long term current use of oral hypoglycemic drug           Return in about 4 months (around 08/01/2024) for DM, HTN.    Sheron Dixons, MD [facility] [place]

## 2024-03-31 NOTE — Assessment & Plan Note (Signed)
 Blood sugars have been stable.  No recent hypoglycemic events requiring assistance. Currently medications are MTF. Lab Results  Component Value Date   HGBA1C 6.4 (A) 11/25/2023   Last visit no changes were made.

## 2024-03-31 NOTE — Assessment & Plan Note (Addendum)
 Blood pressure is well controlled.  Current medications are losartan , torsemide  and spironolactone. Will continue same regimen along with efforts to limit dietary sodium.

## 2024-03-31 NOTE — Assessment & Plan Note (Addendum)
 She is not using CPAP - feels like it was not working well. She complains of  fatigue and irritability - likely due to untreated OSA. I recommend that she follow up with [facility] to see if she needs a new machine or new sleep study.

## 2024-03-31 NOTE — Assessment & Plan Note (Signed)
 LDL is  Lab Results  Component Value Date   LDLCALC 66 03/19/2023   Current regimen is atorvastatin .  No medication side effects noted. Goal LDL is <55.

## 2024-04-01 ENCOUNTER — Ambulatory Visit: Payer: Self-pay | Admitting: Internal Medicine

## 2024-04-01 LAB — HEMOGLOBIN A1C
Est. average glucose Bld gHb Est-mCnc: 154 mg/dL
Hgb A1c MFr Bld: 7 % — ABNORMAL HIGH (ref 4.8–5.6)

## 2024-04-01 LAB — COMPREHENSIVE METABOLIC PANEL WITH GFR
ALT: 21 IU/L (ref 0–32)
AST: 22 IU/L (ref 0–40)
Albumin: 4.7 g/dL (ref 3.9–4.9)
Alkaline Phosphatase: 117 IU/L (ref 44–121)
BUN/Creatinine Ratio: 13 (ref 12–28)
BUN: 12 mg/dL (ref 8–27)
Bilirubin Total: 0.4 mg/dL (ref 0.0–1.2)
CO2: 24 mmol/L (ref 20–29)
Calcium: 9.7 mg/dL (ref 8.7–10.3)
Chloride: 100 mmol/L (ref 96–106)
Creatinine, Ser: 0.93 mg/dL (ref 0.57–1.00)
Globulin, Total: 3.1 g/dL (ref 1.5–4.5)
Glucose: 98 mg/dL (ref 70–99)
Potassium: 4 mmol/L (ref 3.5–5.2)
Sodium: 141 mmol/L (ref 134–144)
Total Protein: 7.8 g/dL (ref 6.0–8.5)
eGFR: 68 mL/min/{1.73_m2} (ref >59)

## 2024-04-01 LAB — CBC WITH DIFFERENTIAL/PLATELET
Basophils Absolute: 0.1 10*3/uL (ref 0.0–0.2)
Basos: 1 %
EOS (ABSOLUTE): 0.1 10*3/uL (ref 0.0–0.4)
Eos: 2 %
Hematocrit: 34.5 % (ref 34.0–46.6)
Hemoglobin: 11.2 g/dL (ref 11.1–15.9)
Immature Grans (Abs): 0 10*3/uL (ref 0.0–0.1)
Immature Granulocytes: 0 %
Lymphocytes Absolute: 1.8 10*3/uL (ref 0.7–3.1)
Lymphs: 42 %
MCH: 26.7 pg (ref 26.6–33.0)
MCHC: 32.5 g/dL (ref 31.5–35.7)
MCV: 82 fL (ref 79–97)
Monocytes Absolute: 0.3 10*3/uL (ref 0.1–0.9)
Monocytes: 8 %
Neutrophils Absolute: 2 10*3/uL (ref 1.4–7.0)
Neutrophils: 47 %
Platelets: 280 10*3/uL (ref 150–450)
RBC: 4.2 x10E6/uL (ref 3.77–5.28)
RDW: 13.1 % (ref 11.7–15.4)
WBC: 4.4 10*3/uL (ref 3.4–10.8)

## 2024-04-01 LAB — LIPID PANEL
Chol/HDL Ratio: 2.2 ratio (ref 0.0–4.4)
Cholesterol, Total: 142 mg/dL (ref 100–199)
HDL: 65 mg/dL (ref >39)
LDL Chol Calc (NIH): 62 mg/dL (ref 0–99)
Triglycerides: 78 mg/dL (ref 0–149)
VLDL Cholesterol Cal: 15 mg/dL (ref 5–40)

## 2024-04-01 LAB — MICROALBUMIN / CREATININE URINE RATIO
Creatinine, Urine: 140 mg/dL
Microalb/Creat Ratio: 9 mg/g{creat} (ref 0–29)
Microalbumin, Urine: 12.6 ug/mL

## 2024-04-01 LAB — TSH: TSH: 1.6 u[IU]/mL (ref 0.450–4.500)

## 2024-04-06 ENCOUNTER — Ambulatory Visit: Payer: Self-pay | Admitting: *Deleted

## 2024-04-06 NOTE — Telephone Encounter (Signed)
 Chief Complaint: Lower BP readings Symptoms: None Frequency: Yesterday morning Pertinent Negatives: Patient denies any symptoms at this time Disposition: [] ED /[] Urgent Care (no appt availability in office) / [] Appointment(In office/virtual)/ []  [place] [facility]/ [] Home Care/ [] Refused Recommended Disposition /[] [place] [facility]/ [x]  Follow-up with PCP Additional Notes: Patient says yesterday morning her BP was 99/61, P 60; recheck at 1635 it was up to 115/65 P 63. She says she checks and records her BP/P readings and the top number ranges for this month so far has been normally 110's-120's. She says she was just concerned about the one yesterday morning and wanted to call to let Dr. Gala Jubilee know. She says she's not feeling bad. Advised I will send this to Dr. Gala Jubilee and someone will call with her recommendations.   Summary: BP Advice   Copied From CRM [id number]. Reason for Triage: Patient is calling to report that she is concerned about her BP reporting today 105/58 Pulse 52. And Yesterday 99/61 pulse 60. Please advise         Reason for Disposition  [1] Systolic BP 90-110 AND [2] taking blood pressure medications AND [3] NOT dizzy, lightheaded or weak  Answer Assessment - Initial Assessment Questions 1. BLOOD PRESSURE: "What is the blood pressure?" "Did you take at least two measurements 5 minutes apart?"     105/58 2. ONSET: "When did you take your blood pressure?"     This morning 3. HOW: "How did you obtain the blood pressure?" (e.g., visiting nurse, automatic home BP monitor)     Automatic cuff on upper arm 4. HISTORY: "Do you have a history of low blood pressure?" "What is your blood pressure normally?"     No, normally 5. MEDICINES: "Are you taking any medications for blood pressure?" If Yes, ask: "Have they been changed recently?"     Yes 6. PULSE RATE: "Do you know what your pulse rate is?"      52 this morning, 60 on yesterday morning 7. OTHER SYMPTOMS: "Have  you been sick recently?" "Have you had a recent injury?"     No  Protocols used: Blood Pressure - Low-A-AH

## 2024-04-06 NOTE — Telephone Encounter (Signed)
 Please review triage note  JM

## 2024-04-06 NOTE — Telephone Encounter (Signed)
 First attempt to return her call.   Left a voicemail to call back.   Will attempt again later.

## 2024-04-06 NOTE — Telephone Encounter (Signed)
 Message from Leory Rands sent at 04/06/2024  8:48 AM EDT  Summary: BP Advice   Copied From CRM [id number]. Reason for Triage: Patient is calling to report that she is concerned about her BP reporting today 105/58 Pulse 52. And Yesterday 99/61 pulse 60. Please advise          Call History  Contact Date/Time Type Contact Phone/Fax By  04/06/2024 08:38 AM EDT Phone (Incoming) Sherlene Adkins, Laura Andrea

## 2024-04-06 NOTE — Telephone Encounter (Signed)
 Spoke with patient and informed her she will take medication as directed. She is also waiting on a call back from doctor office to schedule her Bone density test.   Jm

## 2024-04-07 ENCOUNTER — Other Ambulatory Visit: Payer: Self-pay | Admitting: Internal Medicine

## 2024-04-07 ENCOUNTER — Telehealth: Payer: Self-pay

## 2024-04-07 DIAGNOSIS — E2839 Other primary ovarian failure: Secondary | ICD-10-CM

## 2024-04-07 NOTE — Progress Notes (Unsigned)
 Date:  04/07/2024   Name:  Laura Adkins   DOB:  09-20-1956   MRN:  [id number]   Chief Complaint: No chief complaint on file.  HPI  Review of Systems   Lab Results  Component Value Date   NA 141 03/31/2024   K 4.0 03/31/2024   CO2 24 03/31/2024   GLUCOSE 98 03/31/2024   BUN 12 03/31/2024   CREATININE 0.93 03/31/2024   CALCIUM  9.7 03/31/2024   EGFR 68 03/31/2024   GFRNONAA 72 03/11/2020   Lab Results  Component Value Date   CHOL 142 03/31/2024   HDL 65 03/31/2024   LDLCALC 62 03/31/2024   TRIG 78 03/31/2024   CHOLHDL 2.2 03/31/2024   Lab Results  Component Value Date   TSH 1.600 03/31/2024   Lab Results  Component Value Date   HGBA1C 7.0 (H) 03/31/2024   Lab Results  Component Value Date   WBC 4.4 03/31/2024   HGB 11.2 03/31/2024   HCT 34.5 03/31/2024   MCV 82 03/31/2024   PLT 280 03/31/2024   Lab Results  Component Value Date   ALT 21 03/31/2024   AST 22 03/31/2024   ALKPHOS 117 03/31/2024   BILITOT 0.4 03/31/2024   No results found for: "25OHVITD2", "25OHVITD3", "VD25OH"   Patient Active Problem List   Diagnosis Date Noted   Diabetic polyneuropathy associated with type 2 diabetes mellitus (HCC) 11/22/2022   (HFpEF) heart failure with preserved ejection fraction (HCC) 08/16/2022   Localized edema 05/01/2019   Hyperlipidemia associated with type 2 diabetes mellitus (HCC) 11/06/2017   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    Celebrex  [Celecoxib] Rash    Past Surgical History:  Procedure Laterality Date   BREAST SURGERY  05/1997   necrosis   COLONOSCOPY  04/2008   COLONOSCOPY  04/2018   normal - repeat 10 yrs    Social History   Tobacco Use   Smoking status: Never   Smokeless tobacco: Never  Vaping Use   Vaping status: Never Used  Substance Use Topics   Alcohol use: No    Alcohol/week: 0.0 standard drinks of alcohol   Drug use: No     Medication list has been reviewed and updated.  No outpatient medications have been marked as taking for the 04/07/24 encounter (Orders Only) with Sheron Dixons, MD.       03/31/2024   10:25 AM 10/02/2023   11:18 AM 07/23/2023    4:11 PM 03/19/2023    9:10 AM  GAD 7 : Generalized Anxiety Score  Nervous, Anxious, on Edge 1 0 1 0  Control/stop worrying 0 0 0 0  Worry too much - different things 0 0 1 0  Trouble relaxing 0 0 0 0  Restless 0 0 0 0  Easily annoyed or irritable 0 0 1 0  Afraid - awful might happen 0 0 0 0  Total GAD 7 Score 1 0 3 0  Anxiety Difficulty Not difficult at all Not difficult at all Not difficult at all Not difficult at all       03/31/2024   10:25 AM 10/02/2023   11:18 AM 07/23/2023    4:11 PM  Depression screen PHQ 2/9  Decreased Interest 1 0 0  Down, Depressed, Hopeless 0 0 0  PHQ - 2 Score 1 0 0  Altered sleeping 3 0 0  Tired, decreased energy 2 0 0  Change in appetite 0 0 0  Feeling bad or failure about yourself  0 0 0  Trouble concentrating 1 0 0  Moving slowly or fidgety/restless 0 0 0  Suicidal thoughts 0 0 0  PHQ-9 Score 7 0 0  Difficult doing work/chores Somewhat difficult Not difficult at all Not difficult at all    BP Readings from Last 3 Encounters:  03/31/24 124/80  11/25/23 128/79  10/02/23 124/78    Physical Exam  Wt Readings from Last 3 Encounters:  03/31/24 195 lb (88.5 kg)  11/25/23 194 lb 6.4 oz  (88.2 kg)  10/02/23 200 lb 9.6 oz (91 kg)    There were no vitals taken for this visit.  Assessment and Plan:  Problem List Items Addressed This Visit   None   No follow-ups on file.    Sheron Dixons, MD [facility] [place]

## 2024-04-07 NOTE — Telephone Encounter (Signed)
 Copied from CRM [id number]. Topic: Referral - Question >> Apr 06, 2024  8:39 AM Leory Rands wrote: Reason for CRM: Patient is calling to report that she called [facility] and request assisting with scheduling Bone Denisty Test. Patient called [facility] advised to contact PCP to reschedule.

## 2024-04-08 NOTE — Telephone Encounter (Signed)
 Spoke with patient, order has been placed and patient is aware to call the breast center to have appointment scheduled.

## 2024-04-22 ENCOUNTER — Ambulatory Visit: Payer: BC Managed Care – PPO | Admitting: Emergency Medicine

## 2024-04-22 VITALS — Ht 61.0 in | Wt 194.0 lb

## 2024-04-22 DIAGNOSIS — Z0000 Encounter for general adult medical examination without abnormal findings: Secondary | ICD-10-CM | POA: Diagnosis not present

## 2024-04-22 NOTE — Patient Instructions (Signed)
 Laura Adkins , Thank you for taking time out of your busy schedule to complete your Annual Wellness Visit with me. I enjoyed our conversation and look forward to speaking with you again next year. I, as well as your care team,  appreciate your ongoing commitment to your health goals. Please review the following plan we discussed and let me know if I can assist you in the future. Your Game plan/ To Do List    Referrals: None  Follow up Visits: Next Medicare AWV with our clinical staff: 04/28/25 @ 10:10am (phone visit)   Have you seen your provider in the last 6 months (3 months if uncontrolled diabetes)? Yes Next Office Visit with your provider: 08/03/24 @ 10:00am with Dr. Janna Melter  Clinician Recommendations: Keep up the good work!! Aim for 30 minutes of exercise or brisk walking, 6-8 glasses of water, and 5 servings of fruits and vegetables each day.       This is a list of the screening recommended for you and due dates:  Health Maintenance  Topic Date Due   DEXA scan (bone density measurement)  Never done   COVID-19 Vaccine (6 - 2024-25 season) 02/20/2024   Flu Shot  06/19/2024   Eye exam for diabetics  08/26/2024   Hemoglobin A1C  10/01/2024   Mammogram  01/05/2025   Yearly kidney function blood test for diabetes  03/31/2025   Yearly kidney health urinalysis for diabetes  03/31/2025   Complete foot exam   03/31/2025   Medicare Annual Wellness Visit  04/22/2025   DTaP/Tdap/Td vaccine (3 - Td or Tdap) 02/05/2026   Colon Cancer Screening  05/08/2028   Pneumonia Vaccine  Completed   Hepatitis C Screening  Completed   Zoster (Shingles) Vaccine  Completed   HPV Vaccine  Aged Out   Meningitis B Vaccine  Aged Out    Advanced directives: (Provided) Advance directive discussed with you today. I have provided a copy for you to complete at home and have notarized. Once this is complete, please bring a copy in to our office so we can scan it into your chart.  Advance Care Planning is  important because it:  [x]  Makes sure you receive the medical care that is consistent with your values, goals, and preferences  [x]  It provides guidance to your family and loved ones and reduces their decisional burden about whether or not they are making the right decisions based on your wishes.  Follow the link provided in your after visit summary or read over the paperwork we have mailed to you to help you started getting your Advance Directives in place. If you need assistance in completing these, please reach out to us  so that we can help you!  See attachments for Preventive Care and Fall Prevention Tips.   Fall Prevention in the Home, Adult Falls can cause injuries and affect people of all ages. There are many simple things that you can do to make your home safe and to help prevent falls. If you need it, ask for help making these changes. What actions can I take to prevent falls? General information Use good lighting in all rooms. Make sure to: Replace any light bulbs that burn out. Turn on lights if it is dark and use night-lights. Keep items that you use often in easy-to-reach places. Lower the shelves around your home if needed. Move furniture so that there are clear paths around it. Do not keep throw rugs or other things on the floor that  can make you trip. If any of your floors are uneven, fix them. Add color or contrast paint or tape to clearly mark and help you see: Grab bars or handrails. First and last steps of staircases. Where the edge of each step is. If you use a ladder or stepladder: Make sure that it is fully opened. Do not climb a closed ladder. Make sure the sides of the ladder are locked in place. Have someone hold the ladder while you use it. Know where your pets are as you move through your home. What can I do in the bathroom?     Keep the floor dry. Clean up any water that is on the floor right away. Remove soap buildup in the bathtub or shower. Buildup  makes bathtubs and showers slippery. Use non-skid mats or decals on the floor of the bathtub or shower. Attach bath mats securely with double-sided, non-slip rug tape. If you need to sit down while you are in the shower, use a non-slip stool. Install grab bars by the toilet and in the bathtub and shower. Do not use towel bars as grab bars. What can I do in the bedroom? Make sure that you have a light by your bed that is easy to reach. Do not use any sheets or blankets on your bed that hang to the floor. Have a firm bench or chair with side arms that you can use for support when you get dressed. What can I do in the kitchen? Clean up any spills right away. If you need to reach something above you, use a sturdy step stool that has a grab bar. Keep electrical cables out of the way. Do not use floor polish or wax that makes floors slippery. What can I do with my stairs? Do not leave anything on the stairs. Make sure that you have a light switch at the top and the bottom of the stairs. Have them installed if you do not have them. Make sure that there are handrails on both sides of the stairs. Fix handrails that are broken or loose. Make sure that handrails are as long as the staircases. Install non-slip stair treads on all stairs in your home if they do not have carpet. Avoid having throw rugs at the top or bottom of stairs, or secure the rugs with carpet tape to prevent them from moving. Choose a carpet design that does not hide the edge of steps on the stairs. Make sure that carpet is firmly attached to the stairs. Fix any carpet that is loose or worn. What can I do on the outside of my home? Use bright outdoor lighting. Repair the edges of walkways and driveways and fix any cracks. Clear paths of anything that can make you trip, such as tools or rocks. Add color or contrast paint or tape to clearly mark and help you see high doorway thresholds. Trim any bushes or trees on the main path into  your home. Check that handrails are securely fastened and in good repair. Both sides of all steps should have handrails. Install guardrails along the edges of any raised decks or porches. Have leaves, snow, and ice cleared regularly. Use sand, salt, or ice melt on walkways during winter months if you live where there is ice and snow. In the garage, clean up any spills right away, including grease or oil spills. What other actions can I take? Review your medicines with your health care provider. Some medicines can make you confused or  feel dizzy. This can increase your chance of falling. Wear closed-toe shoes that fit well and support your feet. Wear shoes that have rubber soles and low heels. Use a cane, walker, scooter, or crutches that help you move around if needed. Talk with your provider about other ways that you can decrease your risk of falls. This may include seeing a physical therapist to learn to do exercises to improve movement and strength. Where to find more information Centers for Disease Control and Prevention, STEADI: TonerPromos.no [employer] on Aging: BaseRingTones.pl National Institute on Aging: BaseRingTones.pl Contact a health care provider if: You are afraid of falling at home. You feel weak, drowsy, or dizzy at home. You fall at home. Get help right away if you: Lose consciousness or have trouble moving after a fall. Have a fall that causes a head injury. These symptoms may be an emergency. Get help right away. Call 911. Do not wait to see if the symptoms will go away. Do not drive yourself to the hospital. This information is not intended to replace advice given to you by your health care provider. Make sure you discuss any questions you have with your health care provider. Document Revised: 07/09/2022 Document Reviewed: 07/09/2022 Elsevier Patient Education  2024 [employer].

## 2024-04-22 NOTE — Progress Notes (Signed)
 Subjective:   Tashauna J Suski is a 68 y.o. who presents for a Medicare Wellness preventive visit.  As a reminder, Annual Wellness Visits don't include a physical exam, and some assessments may be limited, especially if this visit is performed virtually. We may recommend an in-person follow-up visit with your provider if needed.  Visit Complete: Virtual I connected with  Hoorain J Urbas on 04/22/24 by a audio enabled telemedicine application and verified that I am speaking with the correct person using two identifiers.  Patient Location: Home  Provider Location: Home Office  I discussed the limitations of evaluation and management by telemedicine. The patient expressed understanding and agreed to proceed.  Vital Signs: Because this visit was a virtual/telehealth visit, some criteria may be missing or patient reported. Any vitals not documented were not able to be obtained and vitals that have been documented are patient reported.  VideoDeclined- This patient declined Librarian, academic. Therefore the visit was completed with audio only.  Persons Participating in Visit: Patient.  AWV Questionnaire: No: Patient Medicare AWV questionnaire was not completed prior to this visit.  Cardiac Risk Factors include: advanced age (>40men, >8 women);hypertension;diabetes mellitus;dyslipidemia;obesity (BMI >30kg/m2);Other (see comment), Risk factor comments: CAD, OSA (no cpap)     Objective:     Today's Vitals   04/22/24 1051  Weight: 194 lb (88 kg)  Height: 5\' 1"  (1.549 m)   Body mass index is 36.66 kg/m.     04/22/2024   11:12 AM 04/17/2023   10:29 AM 03/07/2016   10:12 AM 02/06/2016    9:19 AM 01/06/2016    3:33 PM 06/08/2015    9:12 AM  Advanced Directives  Does Patient Have a Medical Advance Directive? No No No No No No  Would patient like information on creating a medical advance directive? Yes (MAU/Ambulatory/Procedural Areas - Information  given) No - Patient declined No - patient declined information No - patient declined information No - patient declined information Yes - Educational materials given    Current Medications (verified) Outpatient Encounter Medications as of 04/22/2024  Medication Sig   aspirin 81 MG chewable tablet Chew 1 tablet by mouth daily.   atorvastatin  (LIPITOR) 80 MG tablet Take 1 tablet (80 mg total) by mouth daily.   Blood Glucose Monitoring Suppl (ONE TOUCH ULTRA SYSTEM KIT) w/Device KIT 1 kit by Does not apply route once.   Calcium  Carbonate-Vit D-Min (CALTRATE 600+D PLUS MINERALS) 600-800 MG-UNIT TABS Take 1 tablet by mouth daily.   glucose blood (ONETOUCH ULTRA) test strip USE TO TEST BLOOD SUGAR TWICE DAILY   KLOR-CON M20 20 MEQ tablet Take 1 tablet by mouth every other day.   Lancets (ONETOUCH DELICA PLUS LANCET33G) MISC APPLY 1 EACH TOPICALLY DAILY. AS DIRECTED   losartan  (COZAAR ) 100 MG tablet Take 1 tablet (100 mg total) by mouth daily.   metFORMIN  (GLUCOPHAGE ) 500 MG tablet Take 2 tablets (1,000 mg total) by mouth daily.   spironolactone (ALDACTONE) 25 MG tablet Take 25 mg by mouth daily.   torsemide  (DEMADEX ) 20 MG tablet Take 20 mg by mouth daily.   meloxicam  (MOBIC ) 15 MG tablet Take 1 tablet (15 mg total) by mouth daily. (Patient not taking: Reported on 04/22/2024)   nystatin  cream (MYCOSTATIN ) APPLY 1 APPLICATION DAILY TO RASH UNDER BREAST (Patient not taking: Reported on 04/22/2024)   triamcinolone  (KENALOG ) 0.1 % APPLY TO AFFECTED AREA TWICE A DAY (Patient not taking: Reported on 04/22/2024)   No facility-administered encounter medications on file as  of 04/22/2024.    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   Overview:  lgsil pap  01/2013 colpo 02/2014 Suspect d/t atrophy  ASCUS Pap 2021 neg HPV - repeat one year   Past Surgical History:  Procedure Laterality Date   BREAST SURGERY  05/1997   necrosis   COLONOSCOPY  04/2008   COLONOSCOPY  04/2018   normal - repeat 10 yrs   Family History  Problem Relation Age of Onset   Heart disease Mother    Diabetes Father    Heart disease Father    Diabetes Brother    Social History   Socioeconomic History   Marital status: Single    Spouse name: Not on file   Number of children: 0   Years of education: Not on file   Highest education level: Not on file  Occupational History   Occupation: retired  Tobacco Use   Smoking status: Never    Passive exposure: Past   Smokeless tobacco: Never  Vaping Use   Vaping status: Never Used  Substance and Sexual Activity   Alcohol use: No    Alcohol/week: 0.0 standard drinks of alcohol   Drug use: No   Sexual activity: Not on file  Other Topics Concern   Not on file  Social History Narrative   Not on file   Social Drivers of Health   Financial Resource Strain: Medium Risk (04/22/2024)   Overall Financial Resource Strain (CARDIA)    Difficulty of Paying Living Expenses: Somewhat hard  Food Insecurity: No Food Insecurity (04/22/2024)   Hunger Vital Sign    Worried About Running Out of Food in the Last Year: Never true    Ran Out of Food in the Last Year: Never true  Transportation Needs: No Transportation Needs (04/22/2024)   PRAPARE - Administrator, Civil Service (Medical): No    Lack of Transportation (Non-Medical): No  Physical Activity: Sufficiently Active (04/22/2024)   Exercise Vital Sign    Days of Exercise per Week: 6 days    Minutes of Exercise per Session: 50 min  Stress: No Stress Concern Present (04/22/2024)   [employer] of Occupational Health - Occupational Stress Questionnaire    Feeling of Stress : Only a little  Social Connections: Moderately Isolated (04/22/2024)   Social Connection and  Isolation Panel [NHANES]    Frequency of Communication with Friends and Family: More than three times a week    Frequency of Social Gatherings with Friends and Family: Once a week    Attends Religious Services: More than 4 times per year    Active Member of [employer] or Organizations: No    Attends Engineer, structural: Never    Marital Status: Never married    Tobacco Counseling Counseling given: Not Answered    Clinical Intake:  Pre-visit preparation completed: Yes  Pain : No/denies pain     BMI - recorded: 36.66 Nutritional Status: BMI > 30  Obese Nutritional Risks: None Diabetes: Yes CBG done?: No (FBS 124 per patient) Did pt. bring in CBG monitor from home?: No  Lab Results  Component Value Date   HGBA1C 7.0 (H) 03/31/2024   HGBA1C 6.4 (A) 11/25/2023   HGBA1C 5.9 (A) 07/23/2023  How often do you need to have someone help you when you read instructions, pamphlets, or other written materials from your doctor or pharmacy?: 1 - Never  Interpreter Needed?: No  Information entered by :: Jaunita Messier, CMA   Activities of Daily Living     04/22/2024   10:55 AM  In your present state of health, do you have any difficulty performing the following activities:  Hearing? 1  Comment wears hearing aids  Vision? 0  Difficulty concentrating or making decisions? 0  Walking or climbing stairs? 0  Dressing or bathing? 0  Doing errands, shopping? 0  Preparing Food and eating ? N  Using the Toilet? N  In the past six months, have you accidently leaked urine? N  Do you have problems with loss of bowel control? N  Managing your Medications? N  Managing your Finances? N  Housekeeping or managing your Housekeeping? N    Patient Care Team: Sheron Dixons, MD as PCP - General (Internal Medicine) [place] Ina, Army Landsman, MD as Referring Physician (Cardiology) [facility] (Ophthalmology)  I have updated your Care Teams any recent Medical Services you may have  received from other providers in the past year.     Assessment:    This is a routine wellness examination for [place].  Hearing/Vision screen Hearing Screening - Comments:: Wears hearing aids Vision Screening - Comments:: Gets DM eye exams, [facility], [place]   Goals Addressed             This Visit's Progress    Patient Stated       Continue to live and continue to do what I want to do       Depression Screen     04/22/2024   11:07 AM 03/31/2024   10:25 AM 10/02/2023   11:18 AM 07/23/2023    4:11 PM 04/17/2023   10:22 AM 03/19/2023    9:10 AM 11/22/2022    9:33 AM  PHQ 2/9 Scores  PHQ - 2 Score 0 1 0 0 0 0 1  PHQ- 9 Score 1 7 0 0 0 1 1    Fall Risk     04/22/2024   11:13 AM 03/31/2024   10:25 AM 10/02/2023   11:17 AM 07/23/2023    4:11 PM 04/17/2023   10:30 AM  Fall Risk   Falls in the past year? 1 1 0 0 0  Number falls in past yr: 0 0 0 0 0  Injury with Fall? 0 0 0 0 0  Risk for fall due to : History of fall(s);Impaired balance/gait;Orthopedic patient History of fall(s) No Fall Risks No Fall Risks No Fall Risks  Follow up Falls evaluation completed;Education provided Falls evaluation completed Falls evaluation completed Falls evaluation completed Falls prevention discussed;Falls evaluation completed    MEDICARE RISK AT HOME:  Medicare Risk at Home Any stairs in or around the home?: Yes If so, are there any without handrails?: No Home free of loose throw rugs in walkways, pet beds, electrical cords, etc?: Yes Adequate lighting in your home to reduce risk of falls?: Yes Life alert?: No Use of a cane, walker or w/c?: No Grab bars in the bathroom?: No Shower chair or bench in shower?: No Elevated toilet seat or a handicapped toilet?: No  TIMED UP AND GO:  Was the test performed?  No  Cognitive Function: 6CIT completed        04/22/2024   11:15 AM 04/17/2023   10:41 AM  6CIT Screen  What Year? 0 points 0 points  What month? 0 points 0 points  What  time? 0 points 0 points  Count back from 20 0 points 0 points  Months in reverse 0 points 0 points  Repeat phrase 0 points 0 points  Total Score 0 points 0 points    Immunizations Immunization History  Administered Date(s) Administered   Fluad Trivalent(High Dose 65+) 07/23/2023   Influenza,inj,Quad PF,6+ Mos 10/10/2015, 10/08/2016, 08/14/2017, 09/09/2018, 07/14/2019, 07/14/2020, 08/14/2021, 07/19/2022   Moderna Sars-Covid-2 Vaccination 02/08/2020, 03/11/2020, 09/23/2020, 09/13/2022   PNEUMOCOCCAL CONJUGATE-20 03/16/2022   Pfizer(Comirnaty)Fall Seasonal Vaccine 12 years and older 08/22/2023   Pneumococcal Polysaccharide-23 06/04/2013   Pneumococcal-Unspecified 06/04/2013   Tdap 06/28/2014, 02/06/2016   Zoster Recombinant(Shingrix) 07/14/2021, 11/14/2021    Screening Tests Health Maintenance  Topic Date Due   DEXA SCAN  Never done   COVID-19 Vaccine (6 - 2024-25 season) 02/20/2024   INFLUENZA VACCINE  06/19/2024   OPHTHALMOLOGY EXAM  08/26/2024   HEMOGLOBIN A1C  10/01/2024   MAMMOGRAM  01/05/2025   Diabetic kidney evaluation - eGFR measurement  03/31/2025   Diabetic kidney evaluation - Urine ACR  03/31/2025   FOOT EXAM  03/31/2025   Medicare Annual Wellness (AWV)  04/22/2025   DTaP/Tdap/Td (3 - Td or Tdap) 02/05/2026   Colonoscopy  05/08/2028   Pneumonia Vaccine 57+ Years old  Completed   Hepatitis C Screening  Completed   Zoster Vaccines- Shingrix  Completed   HPV VACCINES  Aged Out   Meningococcal B Vaccine  Aged Out    Health Maintenance  Health Maintenance Due  Topic Date Due   DEXA SCAN  Never done   COVID-19 Vaccine (6 - 2024-25 season) 02/20/2024   Health Maintenance Items Addressed: See Nurse Notes at the end of this note  Additional Screening:  Vision Screening: Recommended annual ophthalmology exams for early detection of glaucoma and other disorders of the eye. Would you like a referral to an eye doctor? No    Dental Screening: Recommended annual  dental exams for proper oral hygiene  Community Resource Referral / Chronic Care Management: CRR required this visit?  No   CCM required this visit?  No   Plan:    I have personally reviewed and noted the following in the patient's chart:   Medical and social history Use of alcohol, tobacco or illicit drugs  Current medications and supplements including opioid prescriptions. Patient is not currently taking opioid prescriptions. Functional ability and status Nutritional status Physical activity Advanced directives List of other physicians Hospitalizations, surgeries, and ER visits in previous 12 months Vitals Screenings to include cognitive, depression, and falls Referrals and appointments  In addition, I have reviewed and discussed with patient certain preventive protocols, quality metrics, and best practice recommendations. A written personalized care plan for preventive services as well as general preventive health recommendations were provided to patient.   Jaunita Messier, CMA   04/22/2024   After Visit Summary: (Mail) Due to this being a telephonic visit, the after visit summary with patients personalized plan was offered to patient via mail   Notes: Please refer to Routing Comments.

## 2024-04-29 ENCOUNTER — Other Ambulatory Visit: Payer: Self-pay | Admitting: Internal Medicine

## 2024-04-29 ENCOUNTER — Telehealth: Payer: Self-pay | Admitting: Internal Medicine

## 2024-04-29 ENCOUNTER — Ambulatory Visit
Admission: RE | Admit: 2024-04-29 | Discharge: 2024-04-29 | Disposition: A | Discharge status: Home / Self Care | Source: Ambulatory Visit | Attending: Internal Medicine | Admitting: Internal Medicine

## 2024-04-29 ENCOUNTER — Ambulatory Visit: Payer: Self-pay | Admitting: Internal Medicine

## 2024-04-29 DIAGNOSIS — E2839 Other primary ovarian failure: Secondary | ICD-10-CM | POA: Diagnosis present

## 2024-04-29 DIAGNOSIS — E118 Type 2 diabetes mellitus with unspecified complications: Secondary | ICD-10-CM

## 2024-04-29 MED ORDER — ONETOUCH DELICA PLUS LANCET33G MISC: 1.0000 | Freq: Every day | 3 refills | Status: AC

## 2024-04-29 MED ORDER — ONETOUCH ULTRA VI STRP: ORAL_STRIP | 1 refills | Status: AC

## 2024-04-29 NOTE — Telephone Encounter (Signed)
 Completed. JM

## 2024-04-29 NOTE — Telephone Encounter (Signed)
 Spoke with a lady at [employer]. She informed me that patient has not been seen since 2017 no new order for sleep study or  notes in chart since 2017. She advise that if patient has old machine she would benefit from get new sleep study.   JM

## 2024-04-29 NOTE — Telephone Encounter (Signed)
 Please review and advise.   JM

## 2024-04-29 NOTE — Telephone Encounter (Signed)
 Patient came into office. She received a letter from [facility] stating her meter and sticks will not be available after July1. Patient is wanting new strips sent [facility] [address]. Patient also is requesting when will her sleep study be ready for her to take. Please call patient. Patient went by sleep center, and they said the doctor didn't contact them. Patient stated she has an old machine and wants to know if she needs another one.

## 2024-04-30 ENCOUNTER — Telehealth: Payer: Self-pay | Admitting: Internal Medicine

## 2024-04-30 NOTE — Telephone Encounter (Signed)
 Copied from CRM [id number]. Topic: General - Other >> Apr 30, 2024  2:28 PM Emylou G wrote: Reason for CRM: adv patient of normal scan.. per msg rcvd.Aaron Aas

## 2024-04-30 NOTE — Telephone Encounter (Signed)
 Spoke with [facility] pharmacist regarding patient's test strips concerns, pharmacist informed me that patient was requesting her refill for the test strips to early and refill would be available on 05/01/2024. Called patient and made her aware that her next refill for her test strips would be available til 05/01/2024. Patient verbalized understanding.

## 2024-04-30 NOTE — Telephone Encounter (Signed)
 Copied from CRM [id number]. Topic: General - Other >> Apr 30, 2024  2:31 PM Emylou G wrote: Reason for CRM: Patient called.. Pls call her.. has some concerns about her test strips letter she rcvd.. not being covered past July?

## 2024-05-02 ENCOUNTER — Other Ambulatory Visit: Payer: Self-pay | Admitting: Internal Medicine

## 2024-05-02 DIAGNOSIS — I10 Essential (primary) hypertension: Secondary | ICD-10-CM

## 2024-05-05 NOTE — Telephone Encounter (Signed)
 Requested medication (s) are due for refill today: yes  Requested medication (s) are on the active medication list: yes  Last refill:  01/30/24  Future visit scheduled: yes  Notes to clinic:  Not assigned to protocol, routing for approval.     Requested Prescriptions  Pending Prescriptions Disp Refills   torsemide  (DEMADEX ) 20 MG tablet [Pharmacy Med Name: TORSEMIDE  20 MG TABLET] 90 tablet 3    Sig: TAKE 1 TABLET BY MOUTH EVERY DAY     Cardiovascular:  Diuretics - Loop Failed - 05/05/2024  8:13 AM      Failed - Mg Level in normal range and within 180 days    No results found for: MG       Passed - K in normal range and within 180 days    Potassium  Date Value Ref Range Status  03/31/2024 4.0 3.5 - 5.2 mmol/L Final         Passed - Ca in normal range and within 180 days    Calcium   Date Value Ref Range Status  03/31/2024 9.7 8.7 - 10.3 mg/dL Final         Passed - Na in normal range and within 180 days    Sodium  Date Value Ref Range Status  03/31/2024 141 134 - 144 mmol/L Final         Passed - Cr in normal range and within 180 days    Creatinine, Ser  Date Value Ref Range Status  03/31/2024 0.93 0.57 - 1.00 mg/dL Final         Passed - Cl in normal range and within 180 days    Chloride  Date Value Ref Range Status  03/31/2024 100 96 - 106 mmol/L Final         Passed - Last BP in normal range    BP Readings from Last 1 Encounters:  03/31/24 124/80         Passed - Valid encounter within last 6 months    Recent Outpatient Visits           1 month ago Annual physical exam   [facility] at [facility], [place], MD

## 2024-05-11 ENCOUNTER — Other Ambulatory Visit: Payer: Self-pay | Admitting: Internal Medicine

## 2024-05-13 NOTE — Telephone Encounter (Signed)
 Requested medication (s) are due for refill today: yes  Requested medication (s) are on the active medication list: yes  Last refill:  03/31/24  Future visit scheduled: no  Notes to clinic:  Historical provider/medication, please review for refill   Notes from [facility] with Dr. Justus Essential (primary) hypertension (Chronic)     Blood pressure is well controlled.  Current medications are losartan , torsemide  and spironolactone. Will continue same regimen along with efforts to limit dietary sodium.        Requested Prescriptions  Pending Prescriptions Disp Refills   torsemide  (DEMADEX ) 20 MG tablet [Pharmacy Med Name: TORSEMIDE  20 MG TABLET] 90 tablet 3    Sig: TAKE 1 TABLET BY MOUTH EVERY DAY     Cardiovascular:  Diuretics - Loop Failed - 05/13/2024  8:40 AM      Failed - Mg Level in normal range and within 180 days    No results found for: MG       Passed - K in normal range and within 180 days    Potassium  Date Value Ref Range Status  03/31/2024 4.0 3.5 - 5.2 mmol/L Final         Passed - Ca in normal range and within 180 days    Calcium   Date Value Ref Range Status  03/31/2024 9.7 8.7 - 10.3 mg/dL Final         Passed - Na in normal range and within 180 days    Sodium  Date Value Ref Range Status  03/31/2024 141 134 - 144 mmol/L Final         Passed - Cr in normal range and within 180 days    Creatinine, Ser  Date Value Ref Range Status  03/31/2024 0.93 0.57 - 1.00 mg/dL Final         Passed - Cl in normal range and within 180 days    Chloride  Date Value Ref Range Status  03/31/2024 100 96 - 106 mmol/L Final         Passed - Last BP in normal range    BP Readings from Last 1 Encounters:  03/31/24 124/80         Passed - Valid encounter within last 6 months    Recent Outpatient Visits           1 month ago Annual physical exam   [facility] at [facility], Leita DEL, MD

## 2024-05-14 ENCOUNTER — Ambulatory Visit: Admitting: Internal Medicine

## 2024-05-14 ENCOUNTER — Encounter: Payer: Self-pay | Admitting: Internal Medicine

## 2024-05-14 VITALS — BP 124/76 | HR 88 | Ht 61.0 in | Wt 195.0 lb

## 2024-05-14 DIAGNOSIS — I10 Essential (primary) hypertension: Secondary | ICD-10-CM | POA: Diagnosis not present

## 2024-05-14 DIAGNOSIS — E1169 Type 2 diabetes mellitus with other specified complication: Secondary | ICD-10-CM | POA: Diagnosis not present

## 2024-05-14 DIAGNOSIS — G4733 Obstructive sleep apnea (adult) (pediatric): Secondary | ICD-10-CM

## 2024-05-14 DIAGNOSIS — E118 Type 2 diabetes mellitus with unspecified complications: Secondary | ICD-10-CM

## 2024-05-14 DIAGNOSIS — Z7984 Long term (current) use of oral hypoglycemic drugs: Secondary | ICD-10-CM

## 2024-05-14 DIAGNOSIS — E785 Hyperlipidemia, unspecified: Secondary | ICD-10-CM

## 2024-05-14 MED ORDER — LOSARTAN POTASSIUM 100 MG PO TABS: 100.0000 mg | ORAL_TABLET | Freq: Every day | ORAL | 1 refills | Status: AC

## 2024-05-14 MED ORDER — METFORMIN HCL 500 MG PO TABS: 1000.0000 mg | ORAL_TABLET | Freq: Every day | ORAL | 1 refills | Status: AC

## 2024-05-14 MED ORDER — TORSEMIDE 20 MG PO TABS: 20.0000 mg | ORAL_TABLET | Freq: Every day | ORAL | 1 refills | Status: DC

## 2024-05-14 MED ORDER — ATORVASTATIN CALCIUM 80 MG PO TABS: 80.0000 mg | ORAL_TABLET | Freq: Every day | ORAL | 1 refills | Status: AC

## 2024-05-14 NOTE — Assessment & Plan Note (Signed)
 Lab Results  Component Value Date   LDLCALC 62 03/31/2024  On atorvastatin  80 mg. Refill sent.

## 2024-05-14 NOTE — Progress Notes (Signed)
 Date:  05/14/2024   Name:  Laura Adkins   DOB:  [birth year]   MRN:  [id number]   Chief Complaint: Diabetes  Hypertension This is a chronic problem. The problem is controlled. Pertinent negatives include no chest pain, headaches, palpitations or shortness of breath. Past treatments include angiotensin blockers and diuretics. The current treatment provides significant improvement.  Diabetes She presents for her follow-up diabetic visit. She has type 2 diabetes mellitus. Her disease course has been stable. Pertinent negatives for hypoglycemia include no dizziness or headaches. Pertinent negatives for diabetes include no chest pain, no fatigue and no weakness.    Review of Systems  Constitutional:  Negative for fatigue and unexpected weight change.  HENT:  Negative for trouble swallowing.   Eyes:  Negative for visual disturbance.  Respiratory:  Negative for cough, chest tightness, shortness of breath and wheezing.   Cardiovascular:  Negative for chest pain, palpitations and leg swelling.  Gastrointestinal:  Negative for abdominal pain, constipation and diarrhea.  Musculoskeletal:  Negative for arthralgias and myalgias.  Neurological:  Negative for dizziness, weakness, light-headedness and headaches.     Lab Results  Component Value Date   NA 141 03/31/2024   K 4.0 03/31/2024   CO2 24 03/31/2024   GLUCOSE 98 03/31/2024   BUN 12 03/31/2024   CREATININE 0.93 03/31/2024   CALCIUM  9.7 03/31/2024   EGFR 68 03/31/2024   GFRNONAA 72 03/11/2020   Lab Results  Component Value Date   CHOL 142 03/31/2024   HDL 65 03/31/2024   LDLCALC 62 03/31/2024   TRIG 78 03/31/2024   CHOLHDL 2.2 03/31/2024   Lab Results  Component Value Date   TSH 1.600 03/31/2024   Lab Results  Component Value Date   HGBA1C 7.0 (H) 03/31/2024   Lab Results  Component Value Date   WBC 4.4 03/31/2024   HGB 11.2 03/31/2024   HCT 34.5 03/31/2024   MCV 82 03/31/2024   PLT 280 03/31/2024   Lab  Results  Component Value Date   ALT 21 03/31/2024   AST 22 03/31/2024   ALKPHOS 117 03/31/2024   BILITOT 0.4 03/31/2024   No results found for: MARIEN BOLLS, VD25OH   Patient Active Problem List   Diagnosis Date Noted   Diabetic polyneuropathy associated with type 2 diabetes mellitus (HCC) 11/22/2022   (HFpEF) heart failure with preserved ejection fraction (HCC) 08/16/2022   Localized edema 05/01/2019   Hyperlipidemia associated with type 2 diabetes mellitus (HCC) 11/06/2017   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    Celebrex  [Celecoxib] Rash    Past Surgical History:  Procedure Laterality Date   BREAST SURGERY  05/1997   necrosis   COLONOSCOPY  04/2008   COLONOSCOPY  04/2018   normal - repeat 10 yrs    Social History   Tobacco Use   Smoking status: Never    Passive exposure: Past  Smokeless tobacco: Never  Vaping Use   Vaping status: Never Used  Substance Use Topics   Alcohol use: No    Alcohol/week: 0.0 standard drinks of alcohol   Drug use: No     Medication list has been reviewed and updated.  Current Meds  Medication Sig   aspirin 81 MG chewable tablet Chew 1 tablet by mouth daily.   atorvastatin  (LIPITOR) 80 MG tablet Take 1 tablet (80 mg total) by mouth daily.   Blood Glucose Monitoring Suppl (ONE TOUCH ULTRA SYSTEM KIT) w/Device KIT 1  kit by Does not apply route once.   Calcium  Carbonate-Vit D-Min (CALTRATE 600+D PLUS MINERALS) 600-800 MG-UNIT TABS Take 1 tablet by mouth daily.   glucose blood (ONETOUCH ULTRA) test strip USE TO TEST BLOOD SUGAR TWICE DAILY.   KLOR-CON M20 20 MEQ tablet Take 1 tablet by mouth every other day. Cardiology (Patient taking differently: Take 1 tablet by mouth every other day.)   Lancets (ONETOUCH DELICA PLUS LANCET33G) MISC Place 1 each onto the skin daily at 6 (six) AM.   losartan  (COZAAR ) 100 MG tablet Take 1 tablet (100 mg total) by mouth daily.   nystatin  cream (MYCOSTATIN ) APPLY 1 APPLICATION DAILY TO RASH UNDER BREAST   spironolactone (ALDACTONE) 25 MG tablet Take 25 mg by mouth daily. (Patient taking differently: Take 25 mg by mouth daily.)   triamcinolone  (KENALOG ) 0.1 % APPLY TO AFFECTED AREA TWICE A DAY   [DISCONTINUED] atorvastatin  (LIPITOR) 80 MG tablet Take 1 tablet (80 mg total) by mouth daily.   [DISCONTINUED] losartan  (COZAAR ) 100 MG tablet Take 1 tablet (100 mg total) by mouth daily.   [DISCONTINUED] metFORMIN  (GLUCOPHAGE ) 500 MG tablet Take 2 tablets (1,000 mg total) by mouth daily.   [DISCONTINUED] torsemide  (DEMADEX ) 20 MG tablet Take 20 mg by mouth daily.       03/31/2024   10:25 AM 10/02/2023   11:18 AM 07/23/2023    4:11 PM 03/19/2023    9:10 AM  GAD 7 : Generalized Anxiety Score  Nervous, Anxious, on Edge 1 0 1 0  Control/stop worrying 0 0 0 0  Worry too much - different things 0 0 1 0  Trouble relaxing 0 0 0 0  Restless 0 0 0 0  Easily annoyed or irritable 0 0 1 0  Afraid - awful might happen 0 0 0 0  Total GAD 7 Score 1 0 3 0  Anxiety Difficulty Not difficult at all Not difficult at all Not difficult at all Not difficult at all       04/22/2024   11:07 AM 03/31/2024   10:25 AM 10/02/2023   11:18 AM  Depression screen PHQ 2/9  Decreased Interest 0 1 0  Down, Depressed, Hopeless 0 0 0  PHQ - 2 Score 0 1 0  Altered sleeping 1 3 0  Tired, decreased energy 0 2 0   Change in appetite 0 0 0  Feeling bad or failure about yourself  0 0 0  Trouble concentrating 0 1 0  Moving slowly or fidgety/restless 0 0 0  Suicidal thoughts 0 0 0  PHQ-9 Score 1 7 0  Difficult doing work/chores Not difficult at all Somewhat difficult Not difficult at all    BP Readings from Last 3 Encounters:  05/14/24 124/76  03/31/24 124/80  11/25/23 128/79    Physical Exam Vitals and nursing note reviewed.  Constitutional:      General: She is not in acute distress.    Appearance: She is  well-developed.  HENT:     Head: Normocephalic and atraumatic.   Cardiovascular:     Rate and Rhythm: Normal rate and regular rhythm.  Pulmonary:     Effort: Pulmonary effort is normal. No respiratory distress.     Breath sounds: No wheezing or rhonchi.   Musculoskeletal:     Right lower leg: Edema present.     Left lower leg: Edema present.     Comments: Trace ankle edema   Skin:    General: Skin is warm and dry.     Findings: No rash.   Neurological:     Mental Status: She is alert and oriented to person, place, and time.   Psychiatric:        Mood and Affect: Mood normal.        Behavior: Behavior normal.     Wt Readings from Last 3 Encounters:  05/14/24 195 lb (88.5 kg)  04/22/24 194 lb (88 kg)  03/31/24 195 lb (88.5 kg)    BP 124/76   Pulse 88   Ht 5' 1 (1.549 m)   Wt 195 lb (88.5 kg)   SpO2 97%   BMI 36.84 kg/m   Assessment and Plan:  Problem List Items Addressed This Visit       Unprioritized   Essential (primary) hypertension (Chronic)   Blood pressure is well controlled.  Current medications losartan  and torsemide  are refilled.  Cardiology prescribes spironolactone and potassium. Will continue same regimen along with efforts to limit dietary sodium.       Relevant Medications   atorvastatin  (LIPITOR) 80 MG tablet   losartan  (COZAAR ) 100 MG tablet   torsemide  (DEMADEX ) 20 MG tablet   Type II diabetes mellitus with complication (HCC)  (Chronic)   Blood sugars controlled. Continue metformin  - refill sent.      Relevant Medications   atorvastatin  (LIPITOR) 80 MG tablet   losartan  (COZAAR ) 100 MG tablet   metFORMIN  (GLUCOPHAGE ) 500 MG tablet   Hyperlipidemia associated with type 2 diabetes mellitus (HCC) (Chronic)   Lab Results  Component Value Date   LDLCALC 62 03/31/2024  On atorvastatin  80 mg. Refill sent.      Relevant Medications   atorvastatin  (LIPITOR) 80 MG tablet   losartan  (COZAAR ) 100 MG tablet   metFORMIN  (GLUCOPHAGE ) 500 MG tablet   torsemide  (DEMADEX ) 20 MG tablet   OSA on CPAP - Primary   Referred to [name] home sleep study. She may have been called but does not answer perceived spam calls Will give her the number to reach out to get the home study done.       No follow-ups on file.    Leita HILARIO Adie, MD [facility] [place]

## 2024-05-14 NOTE — Assessment & Plan Note (Signed)
 Blood sugars controlled. Continue metformin  - refill sent.

## 2024-05-14 NOTE — Assessment & Plan Note (Addendum)
 Referred to [name] home sleep study. She may have been called but does not answer perceived spam calls Will give her the number to reach out to get the home study done.

## 2024-05-14 NOTE — Assessment & Plan Note (Addendum)
 Blood pressure is well controlled.  Current medications losartan  and torsemide  are refilled.  Cardiology prescribes spironolactone and potassium. Will continue same regimen along with efforts to limit dietary sodium.

## 2024-05-14 NOTE — Patient Instructions (Signed)
 This is the contact for the home sleep study.

## 2024-05-29 ENCOUNTER — Other Ambulatory Visit: Payer: Self-pay

## 2024-05-29 ENCOUNTER — Telehealth: Payer: Self-pay

## 2024-05-29 DIAGNOSIS — I10 Essential (primary) hypertension: Secondary | ICD-10-CM

## 2024-05-29 MED ORDER — TORSEMIDE 20 MG PO TABS: 20.0000 mg | ORAL_TABLET | Freq: Every day | ORAL | 1 refills | Status: DC

## 2024-05-29 NOTE — Telephone Encounter (Signed)
 Correct RX sent in.  JM   Copied from CRM [id number]. Topic: Clinical - Prescription Issue >> May 29, 2024  8:23 AM Laura Adkins wrote: Reason for CRM: Patient calling states medication, torsemide  (DEMADEX ) 20 MG tablet, pharmacy only dispensed 9 tablets of medications.  Per patient usually gets a 90 day supply of all medications. Other medications prescribed were 90 day supply, except for torsemide  (DEMADEX ) 20 MG tablet.  Patient is requesting a follow up call to discuss further. Can be reached at [phone].  Preferred pharmacy:  [id number] [place], [place] - [address] AT [address] [place] [address] Phone: [phone] Fax: [phone]

## 2024-08-03 ENCOUNTER — Ambulatory Visit (INDEPENDENT_AMBULATORY_CARE_PROVIDER_SITE_OTHER): Admitting: Internal Medicine

## 2024-08-03 ENCOUNTER — Encounter: Payer: Self-pay | Admitting: Internal Medicine

## 2024-08-03 VITALS — BP 124/76 | HR 53 | Ht 61.0 in | Wt 199.0 lb

## 2024-08-03 DIAGNOSIS — I10 Essential (primary) hypertension: Secondary | ICD-10-CM

## 2024-08-03 DIAGNOSIS — Z23 Encounter for immunization: Secondary | ICD-10-CM

## 2024-08-03 DIAGNOSIS — Z7984 Long term (current) use of oral hypoglycemic drugs: Secondary | ICD-10-CM | POA: Diagnosis not present

## 2024-08-03 DIAGNOSIS — E118 Type 2 diabetes mellitus with unspecified complications: Secondary | ICD-10-CM | POA: Diagnosis not present

## 2024-08-03 DIAGNOSIS — G4733 Obstructive sleep apnea (adult) (pediatric): Secondary | ICD-10-CM

## 2024-08-03 LAB — POCT GLYCOSYLATED HEMOGLOBIN (HGB A1C): Hemoglobin A1C: 6.5 % — AB (ref 4.0–5.6)

## 2024-08-03 NOTE — Assessment & Plan Note (Signed)
 Blood pressure is well controlled on losartan , torsemide , KCl and prn spironolactone. No medication side effects noted. Plan to continue current medications.

## 2024-08-03 NOTE — Assessment & Plan Note (Addendum)
 Blood sugars have been stable.  No hypoglycemic events since last visit. Currently medications are MTF. Last visit medical regimen changes were none. Lab Results  Component Value Date   HGBA1C 7.0 (H) 03/31/2024  A1C today =  6.5. Eye exam is scheduled. Will continue same medications.

## 2024-08-03 NOTE — Assessment & Plan Note (Addendum)
 She has equipment that is not functional. She needs to have another sleep study -she would like to go to [facility] in [place]. Will place referral.

## 2024-08-03 NOTE — Assessment & Plan Note (Signed)
 She is aware that she has gained a bit of weight since last visit. She is encouraged to work more on diet and try to exercise regularly.

## 2024-08-03 NOTE — Progress Notes (Signed)
 Date:  08/03/2024   Name:  Laura Adkins   DOB:  [birth year]   MRN:  [id number]   Chief Complaint: Diabetes and Hypertension  Hypertension This is a chronic problem. The problem is controlled. Pertinent negatives include no chest pain, headaches, palpitations or shortness of breath. Past treatments include angiotensin blockers and diuretics. The current treatment provides significant improvement. There is no history of kidney disease.  Diabetes She presents for her follow-up diabetic visit. She has type 2 diabetes mellitus. Pertinent negatives for hypoglycemia include no headaches or tremors. Pertinent negatives for diabetes include no chest pain, no fatigue, no polydipsia and no polyuria. Current diabetic treatment includes oral agent (monotherapy) (MTF).  OSA-she has non functional equipment and needs a new sleep study.  Review of Systems  Constitutional:  Negative for appetite change, fatigue, fever and unexpected weight change.  HENT:  Negative for tinnitus and trouble swallowing.   Eyes:  Negative for visual disturbance.  Respiratory:  Negative for cough, chest tightness and shortness of breath.   Cardiovascular:  Negative for chest pain, palpitations and leg swelling.  Gastrointestinal:  Negative for abdominal pain.  Endocrine: Negative for polydipsia and polyuria.  Genitourinary:  Negative for dysuria and hematuria.  Musculoskeletal:  Negative for arthralgias.  Neurological:  Negative for tremors, numbness and headaches.  Psychiatric/Behavioral:  Negative for dysphoric mood.      Lab Results  Component Value Date   NA 141 03/31/2024   K 4.0 03/31/2024   CO2 24 03/31/2024   GLUCOSE 98 03/31/2024   BUN 12 03/31/2024   CREATININE 0.93 03/31/2024   CALCIUM  9.7 03/31/2024   EGFR 68 03/31/2024   GFRNONAA 72 03/11/2020   Lab Results  Component Value Date   CHOL 142 03/31/2024   HDL 65 03/31/2024   LDLCALC 62 03/31/2024   TRIG 78 03/31/2024   CHOLHDL 2.2  03/31/2024   Lab Results  Component Value Date   TSH 1.600 03/31/2024   Lab Results  Component Value Date   HGBA1C 6.5 (A) 08/03/2024   Lab Results  Component Value Date   WBC 4.4 03/31/2024   HGB 11.2 03/31/2024   HCT 34.5 03/31/2024   MCV 82 03/31/2024   PLT 280 03/31/2024   Lab Results  Component Value Date   ALT 21 03/31/2024   AST 22 03/31/2024   ALKPHOS 117 03/31/2024   BILITOT 0.4 03/31/2024   No results found for: MARIEN BOLLS, VD25OH   Patient Active Problem List   Diagnosis Date Noted   Obesity, morbid (HCC) 08/03/2024   Diabetic polyneuropathy associated with type 2 diabetes mellitus (HCC) 11/22/2022   (HFpEF) heart failure with preserved ejection fraction (HCC) 08/16/2022   Localized edema 05/01/2019   Hyperlipidemia associated with type 2 diabetes mellitus (HCC) 11/06/2017   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    Celebrex  [Celecoxib] Rash    Past Surgical History:  Procedure  Laterality Date   BREAST SURGERY  05/1997   necrosis   COLONOSCOPY  04/2008   COLONOSCOPY  04/2018   normal - repeat 10 yrs    Social History   Tobacco Use   Smoking status: Never    Passive exposure: Past   Smokeless tobacco: Never  Vaping Use   Vaping status: Never Used  Substance Use Topics   Alcohol use: No    Alcohol/week: 0.0 standard drinks of  alcohol   Drug use: No     Medication list has been reviewed and updated.  Current Meds  Medication Sig   aspirin 81 MG chewable tablet Chew 1 tablet by mouth daily.   atorvastatin  (LIPITOR) 80 MG tablet Take 1 tablet (80 mg total) by mouth daily.   Blood Glucose Monitoring Suppl (ONE TOUCH ULTRA SYSTEM KIT) w/Device KIT 1 kit by Does not apply route once.   Calcium  Carbonate-Vit D-Min (CALTRATE 600+D PLUS MINERALS) 600-800 MG-UNIT TABS Take 1 tablet by mouth daily.   glucose blood (ONETOUCH ULTRA) test strip USE TO TEST BLOOD SUGAR TWICE DAILY.   KLOR-CON M20 20 MEQ tablet Take 1 tablet by mouth every other day. Cardiology (Patient taking differently: Take 1 tablet by mouth every other day.)   Lancets (ONETOUCH DELICA PLUS LANCET33G) MISC Place 1 each onto the skin daily at 6 (six) AM.   losartan  (COZAAR ) 100 MG tablet Take 1 tablet (100 mg total) by mouth daily.   metFORMIN  (GLUCOPHAGE ) 500 MG tablet Take 2 tablets (1,000 mg total) by mouth daily.   nystatin  cream (MYCOSTATIN ) APPLY 1 APPLICATION DAILY TO RASH UNDER BREAST   spironolactone (ALDACTONE) 25 MG tablet Take 25 mg by mouth daily. (Patient taking differently: Take 25 mg by mouth daily.)   torsemide  (DEMADEX ) 20 MG tablet Take 1 tablet (20 mg total) by mouth daily.   triamcinolone  (KENALOG ) 0.1 % APPLY TO AFFECTED AREA TWICE A DAY       03/31/2024   10:25 AM 10/02/2023   11:18 AM 07/23/2023    4:11 PM 03/19/2023    9:10 AM  GAD 7 : Generalized Anxiety Score  Nervous, Anxious, on Edge 1 0 1 0  Control/stop worrying 0 0 0 0  Worry too much - different things 0 0 1 0  Trouble relaxing 0 0 0 0  Restless 0 0 0 0  Easily annoyed or irritable 0 0 1 0  Afraid - awful might happen 0 0 0 0  Total GAD 7 Score 1 0 3 0  Anxiety Difficulty Not difficult at all Not difficult at all Not difficult at all Not difficult at all       04/22/2024   11:07 AM 03/31/2024   10:25 AM 10/02/2023   11:18 AM  Depression screen PHQ 2/9   Decreased Interest 0 1 0  Down, Depressed, Hopeless 0 0 0  PHQ - 2 Score 0 1 0  Altered sleeping 1 3 0  Tired, decreased energy 0 2 0  Change in appetite 0 0 0  Feeling bad or failure about yourself  0 0 0  Trouble concentrating 0 1 0  Moving slowly or fidgety/restless 0 0 0  Suicidal thoughts 0 0 0  PHQ-9 Score 1 7 0  Difficult doing work/chores Not difficult at all Somewhat difficult Not difficult at all    BP Readings from Last 3 Encounters:  08/03/24 124/76  05/14/24 124/76  03/31/24 124/80    Physical Exam Vitals and nursing note reviewed.  Constitutional:  General: She is not in acute distress.    Appearance: Normal appearance. She is well-developed.  HENT:     Head: Normocephalic and atraumatic.     Right Ear: Tympanic membrane and ear canal normal. Decreased hearing noted.     Left Ear: Tympanic membrane and ear canal normal. Decreased hearing noted.  Neck:     Vascular: No carotid bruit.  Cardiovascular:     Rate and Rhythm: Normal rate and regular rhythm.  Pulmonary:     Effort: Pulmonary effort is normal. No respiratory distress.     Breath sounds: No wheezing or rhonchi.  Musculoskeletal:     Cervical back: Normal range of motion.     Right lower leg: No edema.     Left lower leg: No edema.  Lymphadenopathy:     Cervical: No cervical adenopathy.  Skin:    General: Skin is warm and dry.     Findings: No rash.  Neurological:     Mental Status: She is alert and oriented to person, place, and time.  Psychiatric:        Mood and Affect: Mood normal.        Behavior: Behavior normal.     Wt Readings from Last 3 Encounters:  08/03/24 199 lb (90.3 kg)  05/14/24 195 lb (88.5 kg)  04/22/24 194 lb (88 kg)    BP 124/76   Pulse (!) 53   Ht 5' 1 (1.549 m)   Wt 199 lb (90.3 kg)   SpO2 99%   BMI 37.60 kg/m   Assessment and Plan:  Problem List Items Addressed This Visit       Unprioritized   Essential (primary) hypertension - Primary  (Chronic)   Blood pressure is well controlled on losartan , torsemide , KCl and prn spironolactone. No medication side effects noted. Plan to continue current medications.       OSA on CPAP (Chronic)   She has equipment that is not functional. She needs to have another sleep study -she would like to go to [facility] in [place]. Will place referral.      Relevant Orders   Ambulatory referral to Sleep Studies   Type II diabetes mellitus with complication (HCC) (Chronic)   Blood sugars have been stable.  No hypoglycemic events since last visit. Currently medications are MTF. Last visit medical regimen changes were none. Lab Results  Component Value Date   HGBA1C 7.0 (H) 03/31/2024  A1C today =  6.5. Eye exam is scheduled. Will continue same medications.       Relevant Orders   POCT glycosylated hemoglobin (Hb A1C) (Completed)   Obesity, morbid (HCC)   She is aware that she has gained a bit of weight since last visit. She is encouraged to work more on diet and try to exercise regularly.      Other Visit Diagnoses       Long term current use of oral hypoglycemic drug         Encounter for immunization       Relevant Orders   Flu vaccine HIGH DOSE PF(Fluzone Trivalent) (Completed)       Return in about 4 months (around 12/03/2024) for TOC HTN, DM  Dr Lemon.    Leita HILARIO Adie, MD [facility] [place]

## 2024-09-11 NOTE — Progress Notes (Signed)
 Laura Adkins                                          MRN: [id number]   09/11/2024   The [facility] Quality Team Specialist reviewed this patient medical record for the purposes of chart review for care gap closure. The following were reviewed: abstraction for care gap closure-diabetic eye exam.    [facility] Quality Team

## 2024-09-22 NOTE — Progress Notes (Unsigned)
 [facility] [address] [place], [place] [address]  Pulmonary Sleep Medicine   Office Visit Note  Patient Name: Laura Adkins DOB: 09-May-1956 MRN [id number]    Chief Complaint: Obstructive Sleep Apnea visit  Brief History:  Jerriann is seen today for an initial consult to establish care on BIPAP@ 15/12 cmH20. The patient has a 14 year history of sleep apnea. Prior to BIPAP, patient reports symptoms such as:      Patient *** using PAP nightly.  The patient feels *** after sleeping with PAP.  The patient reports *** from PAP use. Reported sleepiness is  *** and the Epworth Sleepiness Score is *** out of 24. The patient *** take naps. The patient complains of the following: ***  The compliance download shows  compliance with an average use time of *** hours. The AHI is ***  The patient *** of limb movements disrupting sleep.  ROS  General: (-) fever, (-) chills, (-) night sweat Nose and Sinuses: (-) nasal stuffiness or itchiness, (-) postnasal drip, (-) nosebleeds, (-) sinus trouble. Mouth and Throat: (-) sore throat, (-) hoarseness. Neck: (-) swollen glands, (-) enlarged thyroid, (-) neck pain. Respiratory: *** cough, *** shortness of breath, *** wheezing. Neurologic: *** numbness, *** tingling. Psychiatric: *** anxiety, *** depression   Current Medication: Outpatient Encounter Medications as of 09/23/2024  Medication Sig Note   aspirin 81 MG chewable tablet Chew 1 tablet by mouth daily. 05/05/2015: Received from: [facility] Received Sig: Chew by mouth.   atorvastatin  (LIPITOR) 80 MG tablet Take 1 tablet (80 mg total) by mouth daily.    Blood Glucose Monitoring Suppl (ONE TOUCH ULTRA SYSTEM KIT) w/Device KIT 1 kit by Does not apply route once.    Calcium  Carbonate-Vit D-Min (CALTRATE 600+D PLUS MINERALS) 600-800 MG-UNIT TABS Take 1 tablet by mouth daily. 05/05/2015: Received from: [facility] Received Sig: Take by mouth.    glucose blood (ONETOUCH ULTRA) test strip USE TO TEST BLOOD SUGAR TWICE DAILY.    KLOR-CON M20 20 MEQ tablet Take 1 tablet by mouth every other day. Cardiology (Patient taking differently: Take 1 tablet by mouth every other day.)    Lancets (ONETOUCH DELICA PLUS LANCET33G) MISC Place 1 each onto the skin daily at 6 (six) AM.    losartan  (COZAAR ) 100 MG tablet Take 1 tablet (100 mg total) by mouth daily.    metFORMIN  (GLUCOPHAGE ) 500 MG tablet Take 2 tablets (1,000 mg total) by mouth daily.    nystatin  cream (MYCOSTATIN ) APPLY 1 APPLICATION DAILY TO RASH UNDER BREAST    spironolactone (ALDACTONE) 25 MG tablet Take 25 mg by mouth daily. (Patient taking differently: Take 25 mg by mouth daily.)    torsemide  (DEMADEX ) 20 MG tablet Take 1 tablet (20 mg total) by mouth daily.    triamcinolone  (KENALOG ) 0.1 % APPLY TO AFFECTED AREA TWICE A DAY    No facility-administered encounter medications on file as of 09/23/2024.    Surgical History: Past Surgical History:  Procedure Laterality Date   BREAST SURGERY  05/1997   necrosis   COLONOSCOPY  04/2008   COLONOSCOPY  04/2018   normal - repeat 10 yrs    Medical History: Past Medical History:  Diagnosis Date   Diabetes mellitus without complication (HCC)    Dyslipidemia 05/05/2015   Hyperlipidemia    Hypertension    LGSIL (low grade squamous intraepithelial dysplasia) 03/10/2014   Overview:  lgsil pap 01/2013 colpo 02/2014 Suspect d/t atrophy  ASCUS Pap 2021 neg  HPV - repeat one year    Family History: Non contributory to the present illness  Social History: Social History   Socioeconomic History   Marital status: Single    Spouse name: Not on file   Number of children: 0   Years of education: Not on file   Highest education level: Not on file  Occupational History   Occupation: retired  Tobacco Use   Smoking status: Never    Passive exposure: Past   Smokeless tobacco: Never  Vaping Use   Vaping status: Never Used  Substance and  Sexual Activity   Alcohol use: No    Alcohol/week: 0.0 standard drinks of alcohol   Drug use: No   Sexual activity: Not on file  Other Topics Concern   Not on file  Social History Narrative   Not on file   Social Drivers of Health   Financial Resource Strain: Medium Risk (04/22/2024)   Overall Financial Resource Strain (CARDIA)    Difficulty of Paying Living Expenses: Somewhat hard  Food Insecurity: No Food Insecurity (04/22/2024)   Hunger Vital Sign    Worried About Running Out of Food in the Last Year: Never true    Ran Out of Food in the Last Year: Never true  Transportation Needs: No Transportation Needs (04/22/2024)   PRAPARE - Administrator, Civil Service (Medical): No    Lack of Transportation (Non-Medical): No  Physical Activity: Sufficiently Active (04/22/2024)   Exercise Vital Sign    Days of Exercise per Week: 6 days    Minutes of Exercise per Session: 50 min  Stress: No Stress Concern Present (04/22/2024)   [employer] of Occupational Health - Occupational Stress Questionnaire    Feeling of Stress : Only a little  Social Connections: Moderately Isolated (04/22/2024)   Social Connection and Isolation Panel    Frequency of Communication with Friends and Family: More than three times a week    Frequency of Social Gatherings with Friends and Family: Once a week    Attends Religious Services: More than 4 times per year    Active Member of [employer] or Organizations: No    Attends Banker Meetings: Never    Marital Status: Never married  Intimate Partner Violence: Not At Risk (04/22/2024)   Humiliation, Afraid, Rape, and Kick questionnaire    Fear of Current or Ex-Partner: No    Emotionally Abused: No    Physically Abused: No    Sexually Abused: No    Vital Signs: There were no vitals taken for this visit. There is no height or weight on file to calculate BMI.    Examination: General Appearance: The patient is well-developed, well-nourished, and  in no distress. Neck Circumference: *** Skin: Gross inspection of skin unremarkable. Head: normocephalic, no gross deformities. Eyes: no gross deformities noted. ENT: ears appear grossly normal Neurologic: Alert and oriented. No involuntary movements.  STOP BANG RISK ASSESSMENT S (snore) Have you been told that you snore?     YES/NO   T (tired) Are you often tired, fatigued, or sleepy during the day?   YES/NO  O (obstruction) Do you stop breathing, choke, or gasp during sleep? YES/NO   P (pressure) Do you have or are you being treated for high blood pressure? YES/NO   B (BMI) Is your body index greater than 35 kg/m? YES/NO   A (age) Are you 84 years old or older? YES   N (neck) Do you have a neck circumference greater  than 16 inches?   YES/NO   G (gender) Are you a female? NO   TOTAL STOP/BANG "YES" ANSWERS        A STOP-Bang score of 2 or less is considered low risk, and a score of 5 or more is high risk for having either moderate or severe OSA. For people who score 3 or 4, doctors may need to perform further assessment to determine how likely they are to have OSA.         EPWORTH SLEEPINESS SCALE:  Scale:  (0)= no chance of dozing; (1)= slight chance of dozing; (2)= moderate chance of dozing; (3)= high chance of dozing  Chance  Situtation    Sitting and reading: ***    Watching TV: ***    Sitting Inactive in public: ***    As a passenger in car: ***      Lying down to rest: ***    Sitting and talking: ***    Sitting quielty after lunch: ***    In a car, stopped in traffic: ***   TOTAL SCORE:   *** out of 24    SLEEP STUDIES:  Titration (11/2009) BIPAP@ 15/12 cmH20   CPAP COMPLIANCE DATA:  Date Range: ***  Average Daily Use: *** hours  Median Use: ***  Compliance for > 4 Hours: *** days  AHI: *** respiratory events per hour  Days Used: ***  Mask Leak: ***  95th Percentile Pressure: ***         LABS: Recent Results (from the past  2160 hours)  POCT glycosylated hemoglobin (Hb A1C)     Status: Abnormal   Collection Time: 08/03/24 10:14 AM  Result Value Ref Range   Hemoglobin A1C 6.5 (A) 4.0 - 5.6 %    Radiology: DG Bone Density Result Date: 04/29/2024 EXAM: DUAL X-RAY ABSORPTIOMETRY (DXA) FOR BONE MINERAL DENSITY 04/29/2024 1:24 pm CLINICAL DATA:  68 year old Female Postmenopausal. Screening for osteoporosis TECHNIQUE: An axial (e.g., hips, spine) and/or appendicular (e.g., radius) exam was performed, as appropriate, using [employer] at [facility]. Images are obtained for bone mineral density measurement and are not obtained for diagnostic purposes. [id number] Exclusions: L3-L4 due to out of range COMPARISON:  None. FINDINGS: Scan quality: Good. LUMBAR SPINE (L1-L2): BMD (in g/cm2): 1.093 T-score: -0.6 Z-score: 0.3 LEFT FEMORAL NECK: BMD (in g/cm2): 0.972 T-score: -0.5 Z-score: 0.1 LEFT TOTAL HIP: BMD (in g/cm2): 0.993 T-score: -0.1 Z-score: 0.2 RIGHT FEMORAL NECK: BMD (in g/cm2): 0.924 T-score: -0.8 Z-score: -0.2 RIGHT TOTAL HIP: BMD (in g/cm2): 1.004 T-score: 0.0 Z-score: 0.3 FRAX 10-YEAR PROBABILITY OF FRACTURE: FRAX not reported as the lowest BMD is not in the osteopenia range. IMPRESSION: Normal based on BMD. Fracture risk is not increased. RECOMMENDATIONS: 1. All patients should optimize calcium  and vitamin D intake. 2. Consider FDA-approved medical therapies in postmenopausal women and men aged 45 years and older, based on the following: - A hip or vertebral (clinical or morphometric) fracture - T-score less than or equal to -2.5 and secondary causes have been excluded. - Low bone mass (T-score between -1.0 and -2.5) and a 10-year probability of a hip fracture greater than or equal to 3% or a 10-year probability of a major osteoporosis-related fracture greater than or equal to 20% based on the [place] -adapted WHO algorithm. - Clinician judgment and/or patient preferences may  indicate treatment for people with 10-year fracture probabilities above or below these levels 3. Patients with diagnosis of osteoporosis or at  high risk for fracture should have regular bone mineral density tests. For patients eligible for Medicare, routine testing is allowed once every 2 years. The testing frequency can be increased to one year for patients who have rapidly progressing disease, those who are receiving or discontinuing medical therapy to restore bone mass, or have additional risk factors. Electronically Signed   By: Dina  Arceo M.D.   On: 04/29/2024 15:10    No results found.  No results found.    Assessment and Plan: Patient Active Problem List   Diagnosis Date Noted   Obesity, morbid (HCC) 08/03/2024   Diabetic polyneuropathy associated with type 2 diabetes mellitus (HCC) 11/22/2022   (HFpEF) heart failure with preserved ejection fraction (HCC) 08/16/2022   Localized edema 05/01/2019   Hyperlipidemia associated with type 2 diabetes mellitus (HCC) 11/06/2017   Low grade squamous intraepithelial lesion (LGSIL) on Papanicolaou smear of cervix 02/06/2016   Type II diabetes mellitus with complication (HCC) 10/10/2015   Coronary artery disease of native artery of native heart with stable angina pectoris 10/10/2015   Fibrocystic breast 05/05/2015   Essential (primary) hypertension 05/05/2015   Migraine without aura and responsive to treatment 05/05/2015   OSA on CPAP 05/05/2015   Arthritis of knee, degenerative 05/05/2015   Posterior tibial tendinitis 05/05/2015      The patient *** tolerate PAP and reports *** benefit from PAP use. The patient was reminded how to *** and advised to ***. The patient was also counselled on ***. The compliance is ***. The AHI is ***.   ***  General Counseling: I have discussed the findings of the evaluation and examination with Santina.  I have also discussed any further diagnostic evaluation thatmay be needed or ordered today.  Ariyana verbalizes understanding of the findings of todays visit. We also reviewed her medications today and discussed drug interactions and side effects including but not limited excessive drowsiness and altered mental states. We also discussed that there is always a risk not just to her but also people around her. she has been encouraged to call the office with any questions or concerns that should arise related to todays visit.  No orders of the defined types were placed in this encounter.       I have personally obtained a history, examined the patient, evaluated laboratory and imaging results, formulated the assessment and plan and placed orders.  Elfreda DELENA Bathe, MD [facility] Diplomate ABMS Pulmonary Critical Care Medicine and Sleep Medicine

## 2024-09-23 ENCOUNTER — Ambulatory Visit: Payer: Self-pay | Admitting: Internal Medicine

## 2024-09-23 VITALS — BP 138/87 | HR 57 | Resp 16 | Ht 61.0 in | Wt 193.9 lb

## 2024-09-23 DIAGNOSIS — G4733 Obstructive sleep apnea (adult) (pediatric): Secondary | ICD-10-CM

## 2024-09-23 DIAGNOSIS — I10 Essential (primary) hypertension: Secondary | ICD-10-CM

## 2024-09-23 DIAGNOSIS — Z7189 Other specified counseling: Secondary | ICD-10-CM

## 2024-09-23 DIAGNOSIS — E669 Obesity, unspecified: Secondary | ICD-10-CM

## 2024-09-23 NOTE — Progress Notes (Signed)
 Sleep Medicine   Office Visit  Patient Name: Laura Adkins DOB: 07/07/1956 MRN [id number]    Chief Complaint: OSA  Brief History:  Meya presents for an initial consult to establish care previously on BIPAP@ 15/12 cmH20. Patient has a 14 year history of sleep apnea. Patient has a very old BIPAP machine which she has not used for many years.   Patient reports her sleep quality is fair due to not getting enough rest. This is noted most nights.  The patient relates the following symptoms: snoring, brain fog, some daytime sleepiness and fatigue are also present. The patient goes to sleep at 10:00pm and wakes up at 5:30-7:00am. Patient has awakenings at night to use the restroom sometimes and has difficulty falling back to sleep, can take over an hour to dose off. Sleep quality is the same when outside home environment.  Patient has noted no restlessness of her legs at night.  The patient relates no unusual behavior during the night.  The patient depression a history of psychiatric problems. The Epworth Sleepiness Score is 7 out of 24.  The patient relates  Cardiovascular risk factors include: HTN, HF.   ROS  General: (-) fever, (-) chills, (-) night sweat Nose and Sinuses: (-) nasal stuffiness or itchiness, (-) postnasal drip, (-) nosebleeds, (-) sinus trouble. Mouth and Throat: (-) sore throat, (-) hoarseness. Neck: (-) swollen glands, (-) enlarged thyroid, (-) neck pain. Respiratory: - cough, - shortness of breath, - wheezing. Neurologic: - numbness, - tingling. Psychiatric: - anxiety, + depression Sleep behavior: -sleep paralysis -hypnogogic hallucinations -dream enactment      -vivid dreams -cataplexy -night terrors -sleep walking   Current Medication: Outpatient Encounter Medications as of 09/23/2024  Medication Sig Note   aspirin 81 MG chewable tablet Chew 1 tablet by mouth daily. 05/05/2015: Received from: [facility] Received Sig: Chew by mouth.    atorvastatin  (LIPITOR) 80 MG tablet Take 1 tablet (80 mg total) by mouth daily.    Blood Glucose Monitoring Suppl (ONE TOUCH ULTRA SYSTEM KIT) w/Device KIT 1 kit by Does not apply route once.    Calcium  Carbonate-Vit D-Min (CALTRATE 600+D PLUS MINERALS) 600-800 MG-UNIT TABS Take 1 tablet by mouth daily. 05/05/2015: Received from: [facility] Received Sig: Take by mouth.   glucose blood (ONETOUCH ULTRA) test strip USE TO TEST BLOOD SUGAR TWICE DAILY.    KLOR-CON M20 20 MEQ tablet Take 1 tablet by mouth every other day. Cardiology (Patient taking differently: Take 1 tablet by mouth every other day.)    Lancets (ONETOUCH DELICA PLUS LANCET33G) MISC Place 1 each onto the skin daily at 6 (six) AM.    losartan  (COZAAR ) 100 MG tablet Take 1 tablet (100 mg total) by mouth daily.    metFORMIN  (GLUCOPHAGE ) 500 MG tablet Take 2 tablets (1,000 mg total) by mouth daily.    nystatin  cream (MYCOSTATIN ) APPLY 1 APPLICATION DAILY TO RASH UNDER BREAST    spironolactone (ALDACTONE) 25 MG tablet Take 25 mg by mouth daily. (Patient taking differently: Take 25 mg by mouth daily.)    torsemide  (DEMADEX ) 20 MG tablet Take 1 tablet (20 mg total) by mouth daily.    triamcinolone  (KENALOG ) 0.1 % APPLY TO AFFECTED AREA TWICE A DAY    No facility-administered encounter medications on file as of 09/23/2024.    Surgical History: Past Surgical History:  Procedure Laterality Date   BREAST SURGERY  05/1997   necrosis   COLONOSCOPY  04/2008   COLONOSCOPY  04/2018  normal - repeat 10 yrs    Medical History: Past Medical History:  Diagnosis Date   Diabetes mellitus without complication (HCC)    Dyslipidemia 05/05/2015   Hyperlipidemia    Hypertension    LGSIL (low grade squamous intraepithelial dysplasia) 03/10/2014   Overview:  lgsil pap 01/2013 colpo 02/2014 Suspect d/t atrophy  ASCUS Pap 2021 neg HPV - repeat one year    Family History: Non contributory to the present illness  Social  History: Social History   Socioeconomic History   Marital status: Single    Spouse name: Not on file   Number of children: 0   Years of education: Not on file   Highest education level: Not on file  Occupational History   Occupation: retired  Tobacco Use   Smoking status: Never    Passive exposure: Past   Smokeless tobacco: Never  Vaping Use   Vaping status: Never Used  Substance and Sexual Activity   Alcohol use: No    Alcohol/week: 0.0 standard drinks of alcohol   Drug use: No   Sexual activity: Not on file  Other Topics Concern   Not on file  Social History Narrative   Not on file   Social Drivers of Health   Financial Resource Strain: Medium Risk (04/22/2024)   Overall Financial Resource Strain (CARDIA)    Difficulty of Paying Living Expenses: Somewhat hard  Food Insecurity: No Food Insecurity (04/22/2024)   Hunger Vital Sign    Worried About Running Out of Food in the Last Year: Never true    Ran Out of Food in the Last Year: Never true  Transportation Needs: No Transportation Needs (04/22/2024)   PRAPARE - Administrator, Civil Service (Medical): No    Lack of Transportation (Non-Medical): No  Physical Activity: Sufficiently Active (04/22/2024)   Exercise Vital Sign    Days of Exercise per Week: 6 days    Minutes of Exercise per Session: 50 min  Stress: No Stress Concern Present (04/22/2024)   [employer] of Occupational Health - Occupational Stress Questionnaire    Feeling of Stress : Only a little  Social Connections: Moderately Isolated (04/22/2024)   Social Connection and Isolation Panel    Frequency of Communication with Friends and Family: More than three times a week    Frequency of Social Gatherings with Friends and Family: Once a week    Attends Religious Services: More than 4 times per year    Active Member of [employer] or Organizations: No    Attends Banker Meetings: Never    Marital Status: Never married  Intimate Partner  Violence: Not At Risk (04/22/2024)   Humiliation, Afraid, Rape, and Kick questionnaire    Fear of Current or Ex-Partner: No    Emotionally Abused: No    Physically Abused: No    Sexually Abused: No    Vital Signs: Blood pressure 138/87, pulse (!) 57, resp. rate 16, height 5' 1 (1.549 m), weight 193 lb 14.4 oz (88 kg), SpO2 98%. Body mass index is 36.64 kg/m.   Examination: General Appearance: The patient is well-developed, well-nourished, and in no distress. Neck Circumference: 38.5 cm Skin: Gross inspection of skin unremarkable. Head: normocephalic, no gross deformities. Eyes: no gross deformities noted. ENT: ears appear grossly normal Neurologic: Alert and oriented. No involuntary movements.    STOP BANG RISK ASSESSMENT S (snore) Have you been told that you snore?     YES   T (tired) Are you often tired, fatigued,  or sleepy during the day?   YES  O (obstruction) Do you stop breathing, choke, or gasp during sleep? NO   P (pressure) Do you have or are you being treated for high blood pressure? YES   B (BMI) Is your body index greater than 35 kg/m? YES   A (age) Are you 71 years old or older? YES   N (neck) Do you have a neck circumference greater than 16 inches?   NO   G (gender) Are you a female? NO   TOTAL STOP/BANG "YES" ANSWERS 5                                                               A STOP-Bang score of 2 or less is considered low risk, and a score of 5 or more is high risk for having either moderate or severe OSA. For people who score 3 or 4, doctors may need to perform further assessment to determine how likely they are to have OSA.         EPWORTH SLEEPINESS SCALE:  Scale:  (0)= no chance of dozing; (1)= slight chance of dozing; (2)= moderate chance of dozing; (3)= high chance of dozing  Chance  Situtation    Sitting and reading: 2    Watching TV: 2    Sitting Inactive in public: 0    As a passenger in car: 0      Lying down to rest: 2     Sitting and talking: 0    Sitting quielty after lunch: 0    In a car, stopped in traffic: 1   TOTAL SCORE:   7 out of 24    SLEEP STUDIES:  Titration (11/2009) BIPAP@ 15/12cmH20   LABS: Recent Results (from the past 2160 hours)  POCT glycosylated hemoglobin (Hb A1C)     Status: Abnormal   Collection Time: 08/03/24 10:14 AM  Result Value Ref Range   Hemoglobin A1C 6.5 (A) 4.0 - 5.6 %    Radiology: DG Bone Density Result Date: 04/29/2024 EXAM: DUAL X-RAY ABSORPTIOMETRY (DXA) FOR BONE MINERAL DENSITY 04/29/2024 1:24 pm CLINICAL DATA:  68 year old Female Postmenopausal. Screening for osteoporosis TECHNIQUE: An axial (e.g., hips, spine) and/or appendicular (e.g., radius) exam was performed, as appropriate, using [employer] at [facility]. Images are obtained for bone mineral density measurement and are not obtained for diagnostic purposes. [id number] Exclusions: L3-L4 due to out of range COMPARISON:  None. FINDINGS: Scan quality: Good. LUMBAR SPINE (L1-L2): BMD (in g/cm2): 1.093 T-score: -0.6 Z-score: 0.3 LEFT FEMORAL NECK: BMD (in g/cm2): 0.972 T-score: -0.5 Z-score: 0.1 LEFT TOTAL HIP: BMD (in g/cm2): 0.993 T-score: -0.1 Z-score: 0.2 RIGHT FEMORAL NECK: BMD (in g/cm2): 0.924 T-score: -0.8 Z-score: -0.2 RIGHT TOTAL HIP: BMD (in g/cm2): 1.004 T-score: 0.0 Z-score: 0.3 FRAX 10-YEAR PROBABILITY OF FRACTURE: FRAX not reported as the lowest BMD is not in the osteopenia range. IMPRESSION: Normal based on BMD. Fracture risk is not increased. RECOMMENDATIONS: 1. All patients should optimize calcium  and vitamin D intake. 2. Consider FDA-approved medical therapies in postmenopausal women and men aged 53 years and older, based on the following: - A hip or vertebral (clinical or morphometric) fracture - T-score less than or equal to -2.5 and secondary causes  have been excluded. - Low bone mass (T-score between -1.0 and -2.5) and a 10-year  probability of a hip fracture greater than or equal to 3% or a 10-year probability of a major osteoporosis-related fracture greater than or equal to 20% based on the [place] -adapted WHO algorithm. - Clinician judgment and/or patient preferences may indicate treatment for people with 10-year fracture probabilities above or below these levels 3. Patients with diagnosis of osteoporosis or at high risk for fracture should have regular bone mineral density tests. For patients eligible for Medicare, routine testing is allowed once every 2 years. The testing frequency can be increased to one year for patients who have rapidly progressing disease, those who are receiving or discontinuing medical therapy to restore bone mass, or have additional risk factors. Electronically Signed   By: Dina  Arceo M.D.   On: 04/29/2024 15:10    No results found.  No results found.    Assessment and Plan: Patient Active Problem List   Diagnosis Date Noted   Obesity, morbid (HCC) 08/03/2024   Diabetic polyneuropathy associated with type 2 diabetes mellitus (HCC) 11/22/2022   (HFpEF) heart failure with preserved ejection fraction (HCC) 08/16/2022   Localized edema 05/01/2019   Hyperlipidemia associated with type 2 diabetes mellitus (HCC) 11/06/2017   Low grade squamous intraepithelial lesion (LGSIL) on Papanicolaou smear of cervix 02/06/2016   Type II diabetes mellitus with complication (HCC) 10/10/2015   Coronary artery disease of native artery of native heart with stable angina pectoris 10/10/2015   Fibrocystic breast 05/05/2015   Essential (primary) hypertension 05/05/2015   Migraine without aura and responsive to treatment 05/05/2015   OSA on CPAP 05/05/2015   Arthritis of knee, degenerative 05/05/2015   Posterior tibial tendinitis 05/05/2015     PLAN OSA:   Patient evaluation suggests high risk of sleep disordered breathing due to  hx of OSA, snoring, brain fog, some daytime sleepiness and fatigue, and elevated  BMI. Patient has comorbid cardiovascular risk factors including: HTN, HF which could be exacerbated by pathologic sleep-disordered breathing.  Suggest: PSG to assess/treat the patient's sleep disordered breathing. The patient was also counselled on wt loss to optimize sleep health.  1. OSA on CPAP (Primary) Will order PSG to re-evaluate prior to new machine  2. CPAP use counseling CPAP couseling-Discussed importance of adequate CPAP use as well as proper care and cleaning techniques of machine and all supplies.  3. Essential (primary) hypertension Continue current medication and f/u with PCP.  4. Obesity (BMI 30-39.9) Obesity Counseling: Had a lengthy discussion regarding patients BMI and weight issues. Patient was instructed on portion control as well as increased activity. Also discussed caloric restrictions with trying to maintain intake less than 2000 Kcal. Discussions were made in accordance with the 5As of weight management. Simple actions such as not eating late and if able to, taking a walk is suggested.    General Counseling: I have discussed the findings of the evaluation and examination with Nyssa.  I have also discussed any further diagnostic evaluation thatmay be needed or ordered today. Kayline verbalizes understanding of the findings of todays visit. We also reviewed her medications today and discussed drug interactions and side effects including but not limited excessive drowsiness and altered mental states. We also discussed that there is always a risk not just to her but also people around her. she has been encouraged to call the office with any questions or concerns that should arise related to todays visit.  No orders of the defined types  were placed in this encounter.       I have personally obtained a history, evaluated the patient, evaluated pertinent data, formulated the assessment and plan and placed orders.  This patient was seen by Tinnie Pro, PA-C  in collaboration with Dr. Elfreda Bathe as a part of collaborative care agreement.    Elfreda DELENA Bathe, MD [facility] Diplomate ABMS Pulmonary and Critical Care Medicine Sleep medicine

## 2024-09-23 NOTE — Patient Instructions (Signed)

## 2024-11-20 ENCOUNTER — Other Ambulatory Visit: Payer: Self-pay

## 2024-11-20 ENCOUNTER — Other Ambulatory Visit: Payer: Self-pay | Admitting: Internal Medicine

## 2024-11-20 DIAGNOSIS — I10 Essential (primary) hypertension: Secondary | ICD-10-CM

## 2024-11-20 NOTE — Telephone Encounter (Signed)
 Requested medications are due for refill today.  yes  Requested medications are on the active medications list.  yes  Last refill. 05/29/2024 #90 1 rf  Future visit scheduled.   yes  Notes to clinic.  Expired labs.    Requested Prescriptions  Pending Prescriptions Disp Refills   torsemide  (DEMADEX ) 20 MG tablet [Pharmacy Med Name: TORSEMIDE  20 MG TABLET] 90 tablet 1    Sig: TAKE 1 TABLET BY MOUTH EVERY DAY     Cardiovascular:  Diuretics - Loop Failed - 11/20/2024  1:46 PM      Failed - K in normal range and within 180 days    Potassium  Date Value Ref Range Status  03/31/2024 4.0 3.5 - 5.2 mmol/L Final         Failed - Ca in normal range and within 180 days    Calcium   Date Value Ref Range Status  03/31/2024 9.7 8.7 - 10.3 mg/dL Final         Failed - Na in normal range and within 180 days    Sodium  Date Value Ref Range Status  03/31/2024 141 134 - 144 mmol/L Final         Failed - Cr in normal range and within 180 days    Creatinine, Ser  Date Value Ref Range Status  03/31/2024 0.93 0.57 - 1.00 mg/dL Final         Failed - Cl in normal range and within 180 days    Chloride  Date Value Ref Range Status  03/31/2024 100 96 - 106 mmol/L Final         Failed - Mg Level in normal range and within 180 days    No results found for: MG       Passed - Last BP in normal range    BP Readings from Last 1 Encounters:  09/23/24 138/87         Passed - Valid encounter within last 6 months    Recent Outpatient Visits           3 months ago Essential (primary) hypertension   [facility], Leita DEL, MD   6 months ago OSA on CPAP   [facility] at [facility], Leita DEL, MD   7 months ago Annual physical exam   [facility] at [facility], Leita DEL, MD

## 2024-12-07 ENCOUNTER — Ambulatory Visit: Admitting: Student

## 2025-04-28 ENCOUNTER — Ambulatory Visit
